# Patient Record
Sex: Male | Born: 1995
Health system: Southern US, Community
[De-identification: ages and names within clinical notes are randomized; demographics above are authoritative.]

## PROBLEM LIST (undated history)

## (undated) DIAGNOSIS — G709 Myoneural disorder, unspecified: Secondary | ICD-10-CM

## (undated) DIAGNOSIS — G809 Cerebral palsy, unspecified: Secondary | ICD-10-CM

## (undated) DIAGNOSIS — F419 Anxiety disorder, unspecified: Secondary | ICD-10-CM

## (undated) DIAGNOSIS — R569 Unspecified convulsions: Secondary | ICD-10-CM

## (undated) DIAGNOSIS — G808 Other cerebral palsy: Secondary | ICD-10-CM

## (undated) HISTORY — PX: BRAIN SURGERY: SHX531

## (undated) HISTORY — DX: Unspecified convulsions: R56.9

## (undated) HISTORY — PX: LEG TENDON SURGERY: SHX1004

---
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
## 2015-03-22 ENCOUNTER — Other Ambulatory Visit: Payer: Self-pay | Admitting: Family

## 2015-04-15 ENCOUNTER — Other Ambulatory Visit: Payer: Self-pay | Admitting: Family

## 2015-04-17 ENCOUNTER — Other Ambulatory Visit: Payer: Self-pay

## 2015-04-17 MED ORDER — DEPAKOTE SPRINKLES 125 MG PO CSDR: DELAYED_RELEASE_CAPSULE | ORAL | Status: DC

## 2015-04-19 ENCOUNTER — Other Ambulatory Visit: Payer: Self-pay | Admitting: Family

## 2015-04-20 ENCOUNTER — Ambulatory Visit (INDEPENDENT_AMBULATORY_CARE_PROVIDER_SITE_OTHER): Payer: Medicaid Other | Admitting: Family

## 2015-04-20 ENCOUNTER — Encounter: Payer: Self-pay | Admitting: Family

## 2015-04-20 VITALS — BP 100/66 | HR 80 | Wt 75.0 lb

## 2015-04-20 DIAGNOSIS — F71 Moderate intellectual disabilities: Secondary | ICD-10-CM

## 2015-04-20 DIAGNOSIS — G911 Obstructive hydrocephalus: Secondary | ICD-10-CM | POA: Diagnosis not present

## 2015-04-20 DIAGNOSIS — R451 Restlessness and agitation: Secondary | ICD-10-CM

## 2015-04-20 DIAGNOSIS — G40309 Generalized idiopathic epilepsy and epileptic syndromes, not intractable, without status epilepticus: Secondary | ICD-10-CM

## 2015-04-20 DIAGNOSIS — Z87728 Personal history of other specified (corrected) congenital malformations of nervous system and sense organs: Secondary | ICD-10-CM | POA: Diagnosis not present

## 2015-04-20 DIAGNOSIS — G808 Other cerebral palsy: Secondary | ICD-10-CM

## 2015-04-20 DIAGNOSIS — G40209 Localization-related (focal) (partial) symptomatic epilepsy and epileptic syndromes with complex partial seizures, not intractable, without status epilepticus: Secondary | ICD-10-CM | POA: Diagnosis not present

## 2015-04-20 MED ORDER — DIAZEPAM 1 MG/ML PO SOLN: ORAL | Status: DC

## 2015-04-20 NOTE — Patient Instructions (Signed)
For Theodore Lewis's restless behavior, I sent a prescription to the pharmacy for Diazepam liquid. Give him 12ml up to twice per day as needed for restless behavior.  Call and let me know how this works. I have started him at the lowest dose - we may need to increase the dose if this is too low.   Continue his other medications without change for now. Let me know if his seizure frequency increases.   Please plan to return for follow up in 1 year or sooner if needed.

## 2015-04-20 NOTE — Progress Notes (Signed)
Patient: Theodore Lewis MRN: [id number] Sex: male DOB: 1996-09-11  Provider: Elveria Rising, NP Location of Care: [facility] Child Neurology  Note type: Routine return visit  History of Present Illness: Referral Source: Dr. Bernadette Hoit History from: both parents Chief Complaint: Seizures/ Congenital Quadriparesis  Theodore Lewis is a 19 y.o. with history of seizures, quadriparesis, and moderate cognitive disability. He was last seen Mar 14, 2014. Theodore Lewis had post hemorrhagic hydrocephalus with delayed appearance until he was a couple of months out of the nursery. This required a ventriculoperitoneal shunt. He has spastic quadriparesis and contractures, poor vision, evidence of an axial collection of mixed-type in the right frontal temporal parietal region and possible infarction of the right brain with encephalomalacia of the left brain. The shunt has been functioning well.  Theodore Lewis's parents report today that he has intermittent seizures, approximately once or twice per month. Most do not require Diastat but occasionally they give it when the seizure is not brief. He has had high therapeutic levels of both his antiepileptic medicines in the past. Theodore Lewis takes and tolerates Carbatrol and Depakote Sprinkles but his parents say that he strongly resists taking the medications when administered.  Theodore Lewis attends [employer] in [place]. He will attend there until he is 22. He has a CAPS worker 36 to 40 hours per week. His parents also have respite care, but they never use it.  Today his parents ask about giving him a medication to help him to be calmer when they are out in public, or have visitors in their home. They say that in public places, such as restaurants or when they have visitors in their home, Theodore Lewis becomes increasingly agitated, making vocalizations, moving in his chair, pulling at everything he can reach. They have tried behavioral interventions to no avail.  Parents said that Dr Sharene Skeans has suggested giving him something for anxiety at these times.    Theodore Lewis has been otherwise healthy since last seen. His parents have no other health concerns for him today.  Review of Systems: Please see the HPI for neurologic and other pertinent review of systems. Otherwise, the following systems are noncontributory including constitutional, eyes, ears, nose and throat, cardiovascular, respiratory, gastrointestinal, genitourinary, musculoskeletal, skin, endocrine, hematologic/lymph, allergic/immunologic and psychiatric.   Past Medical History  Diagnosis Date  . Cerebral palsy   . Congenital quadriparesis   . Seizures     last seizure 3 yrs ago  . Seizure   . Anxiety   . Neuromuscular disorder    Hospitalizations: No., Head Injury: No., Nervous System Infections: No., Immunizations up to date: Yes.   Past Medical History Comments: He presented at few months of age with massive hydrocephalus. He required urgent placement of a ventriculoperitoneal shunt. He has experienced revisions in the past. Brain images in 2007 showed an extra-axial collection of mixed type in the right frontotemporal parietal region with possible infarction in the right brain and significant encephalomalacia of the left brain. Ventricles were well decompressed.  A CT scan of the brain and shunt series Mar 04, 2013 showed microcephaly with marked diffuse thickening of the calvarium and marked enlargement of the mastoid sinus bilaterally. Left frontal shunt catheter extends to the region of the third ventricle and is unchanged. No hydrocephalus. There is cerebellar atrophy bilaterally. 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 Brainstem is small. There is atrophy of the left occipital lobe. Agenesis of the corpus callosum.  Large extra-axial fluid collection on the right  measures 4.6 x 11.1 cm. This has low density centrally and the wall has calcified since  the prior study. The fluid collection has matured and become better defined since the prior study. There is some mass effect on the right cerebral hemisphere. No midline shift to the left. No acute hemorrhage.  Shunt tubing was intact from the ventricles to the abdomen.   Surgical History Past Surgical History  Procedure Laterality Date  . Brain surgery      Shunt placed when he was a yr. old  . Leg tendon surgery Bilateral approx 6 yrs ago  . I&d extremity Bilateral 04/29/2014    Procedure: IRRIGATION AND DEBRIDEMENT EXTREMITY;  Surgeon: Eldred Manges, MD;  Location: [facility] OR;  Service: Orthopedics;  Laterality: Bilateral;  Right and Left Hamstring Lenghening and Fiberglass Casting    Family History family history includes Stomach cancer in his paternal grandfather and paternal grandmother; Throat cancer in his paternal grandfather. Family History is otherwise negative for migraines, seizures, cognitive impairment, blindness, deafness, birth defects, chromosomal disorder, autism.  Social History History   Social History  . Marital Status: Single    Spouse Name: N/A  . Number of Children: N/A  . Years of Education: N/A   Social History Main Topics  . Smoking status: Never Smoker   . Smokeless tobacco: Never Used  . Alcohol Use: No  . Drug Use: No  . Sexual Activity: No   Other Topics Concern  . None   Social History Narrative   Educational level: special education School Attending: [employer] Living with:  both parents  Hobbies/Interest: Theodore Lewis enjoys walking, swimming, watching TV, drinking and eating.  School comments:  Theodore Lewis is doing well in school.     Allergies No Known Allergies  Physical Exam BP 100/66 mmHg  Pulse 80  Wt 75 lb (34.02 kg) General: thin young man seated in wheelchair, in no evident distress  Head: he has a ventriculoperitoneal shunt with palpable tubing on his head on the left  Ears, Nose and Throat: unable to fully assess  as he is unable to follow directions  Neck: supple with no carotid or supraclavicular bruits.  Respiratory: lungs are clear to auscultation  Cardiovascular: regular rate and rhythm, no murmurs.  Musculoskeletal: thin extremities with significant muscle wasting; Significant contractions and the hips, knees, ankles, elbows, wrists, and shoulders  Skin: no rashes or lesions  Neurologic Exam  Mental Status: Awake and fully alert. He is in near constant movement, pulling at the examiner and equipment. He does not follow instructions and is not cooperative. He has no language. Cranial Nerves: Fundoscopic exam - red reflex present. Unable to visualize fundus. Pupils equal, briskly reactive to light. No nystagmus. He would briefly fix and follow on visual stimuli. He turns to localize sounds. His face appears to move normally and symmetrically. Neck flexion and extension normal.  Motor: Increased tone in extremities. He has tight heel cords and appears to have fixed contractures at the knees, left more than right. He was difficult to assess due to his lack of cooperation. He had a strong grip bilaterally, but clumsy and dystonic posturing of the left hand in comparison to the right when trying to reach for objects.  Sensory: Withdrawal x4  Coordination: Poor coordination with reaching for objects.  Gait and Station: Wheelchair bound  Reflexes: 1+ and symmetric. Toes appear to be downgoing but again was difficult to assess due to lack of cooperation. No clonus.  Impression 1. Localization-related  epilepsy with complex partial seizures 2. Generalized convulsive epilepsy 3. Acquired obstructive hydrocephalus with functioning VP shunt 4. Congenital quadriplegia with sparing of the right arm 5. Moderate intellectual disabilities 6. Episodic agitated behavior.  Recommendations for plan of care The patient's previous [facility] records were reviewed. Morrell has neither had nor required imaging  or lab studies since the last visit. He is a 19 year old boy with history of seizures, quadriparesis, and moderate cognitive disability. He has ongoing intermittent seizures that have not increased in frequency or severity. He will continue his antiepileptic medications without change for now. I talked with his parents about the agitation that Costa exhibits in public or with visitors and recommended a trial of Diazepam prior to taking him out or when they anticipate visitors in their home. I asked them to let me know how this works. I completed a form for [facility] for him to receive Diastat for seizures. I will see Aloysious back in follow up in 6 months or sooner if needed.   The medication list was reviewed and reconciled.  I reviewed changes were made in the prescribed medications today.  A complete medication list was provided to the patient's parents.  Dr. Sharene Skeans was consulted regarding the patient.   Total time spent with the patient was 30 minutes, of which 50% or more was spent in counseling and coordination of care.

## 2015-04-25 ENCOUNTER — Other Ambulatory Visit: Payer: Self-pay | Admitting: Family

## 2015-05-27 ENCOUNTER — Other Ambulatory Visit: Payer: Self-pay | Admitting: Family

## 2015-06-18 ENCOUNTER — Other Ambulatory Visit: Payer: Self-pay | Admitting: Pediatrics

## 2015-08-30 DIAGNOSIS — Z982 Presence of cerebrospinal fluid drainage device: Secondary | ICD-10-CM | POA: Insufficient documentation

## 2015-10-15 ENCOUNTER — Other Ambulatory Visit: Payer: Self-pay | Admitting: Family

## 2015-11-24 ENCOUNTER — Telehealth: Payer: Self-pay | Admitting: Family

## 2015-11-24 NOTE — Telephone Encounter (Signed)
Mom oan Aten left message about Theodore Lewis. She said that [facility] needs a letter regarding his condition so that he can continue his support plan. She said that he needs it by Jan 26 and asked for it to be faxed to [facility] fax # [phone]. Mom can be reached at ph [phone]. I called and spoke with Dad, letting him know that we will fax the letter as requested. TG

## 2015-11-29 NOTE — Telephone Encounter (Signed)
I faxed the letter to [facility] at [facility] as requested. TG

## 2015-12-28 ENCOUNTER — Other Ambulatory Visit: Payer: Self-pay | Admitting: Family

## 2016-01-21 ENCOUNTER — Other Ambulatory Visit: Payer: Self-pay | Admitting: Pediatrics

## 2016-04-22 ENCOUNTER — Other Ambulatory Visit: Payer: Self-pay | Admitting: Family

## 2016-04-30 ENCOUNTER — Ambulatory Visit (INDEPENDENT_AMBULATORY_CARE_PROVIDER_SITE_OTHER): Payer: Medicare Other | Admitting: Family

## 2016-04-30 ENCOUNTER — Encounter: Payer: Self-pay | Admitting: Family

## 2016-04-30 VITALS — BP 104/70 | HR 88 | Wt 75.0 lb

## 2016-04-30 DIAGNOSIS — Z79899 Other long term (current) drug therapy: Secondary | ICD-10-CM

## 2016-04-30 DIAGNOSIS — M629 Disorder of muscle, unspecified: Secondary | ICD-10-CM | POA: Diagnosis not present

## 2016-04-30 DIAGNOSIS — R451 Restlessness and agitation: Secondary | ICD-10-CM | POA: Diagnosis not present

## 2016-04-30 DIAGNOSIS — Z87728 Personal history of other specified (corrected) congenital malformations of nervous system and sense organs: Secondary | ICD-10-CM | POA: Diagnosis not present

## 2016-04-30 DIAGNOSIS — G40209 Localization-related (focal) (partial) symptomatic epilepsy and epileptic syndromes with complex partial seizures, not intractable, without status epilepticus: Secondary | ICD-10-CM

## 2016-04-30 DIAGNOSIS — F71 Moderate intellectual disabilities: Secondary | ICD-10-CM

## 2016-04-30 DIAGNOSIS — G911 Obstructive hydrocephalus: Secondary | ICD-10-CM | POA: Diagnosis not present

## 2016-04-30 DIAGNOSIS — G808 Other cerebral palsy: Secondary | ICD-10-CM

## 2016-04-30 DIAGNOSIS — G40309 Generalized idiopathic epilepsy and epileptic syndromes, not intractable, without status epilepticus: Secondary | ICD-10-CM

## 2016-04-30 MED ORDER — CARBATROL 300 MG PO CP12: 300.0000 mg | ORAL_CAPSULE | Freq: Two times a day (BID) | ORAL | Status: DC

## 2016-04-30 MED ORDER — DIASTAT ACUDIAL 10 MG RE GEL: RECTAL | Status: DC

## 2016-04-30 MED ORDER — DEPAKOTE SPRINKLES 125 MG PO CSDR: DELAYED_RELEASE_CAPSULE | ORAL | Status: DC

## 2016-04-30 NOTE — Patient Instructions (Signed)
Because Theodore Lewis has been experiencing more seizures than usual, I have ordered lab studies to check his Carbatrol and Depakote levels, as well as check his blood counts and liver. The blood needs to be drawn in the morning, before his first dose of medication of the day. He can have breakfast and drink liquids that morning. I will call you when the lab results are available.   Theodore Lewis may need to have an EEG if his lab studies are normal but seizures continue.   Try to work on helping him to get more sleep.   Call me if you have questions or concerns.   I will see Theodore Lewis again in 1 year or sooner if needed.

## 2016-04-30 NOTE — Progress Notes (Signed)
Patient: Theodore Lewis MRN: [id number] Sex: male DOB: 04-02-1996  Provider: Elveria Rising, NP Location of Care: [facility] Child Neurology  Note type: Routine return visit  History of Present Illness: Referral Source: Dr. Bernadette Hoit History from: both parents, patient and [facility] chart Chief Complaint: Seizures/Congenital Quadriparesis  Theodore Lewis is a 20 y.o. young man with history of seizures, quadriparesis, and moderate cognitive disability. He was last seen April 20, 2015. Theodore Lewis had post hemorrhagic hydrocephalus with delayed appearance until he was a couple of months out of the nursery. This required a ventriculoperitoneal shunt. He has spastic quadriparesis and contractures, poor vision, evidence of an axial collection of mixed-type in the right frontal temporal parietal region and possible infarction of the right brain with encephalomalacia of the left brain. The shunt has been functioning well.  Theodore Lewis's parents report today that he has had some increase in seizures, usually late in the afternoon or evening. Most do not require Diastat but occasionally they give it when the seizure is not brief. Theodore Lewis takes and tolerates Carbatrol and Depakote Sprinkles but his parents say that he strongly resists taking the medications when administered. His parents also note that he sleeps poorly, sometimes remaining awake until early morning, and then napping a short time before getting up for the day. They feel that he sleeps better when he has had a busy, active day.   Theodore Lewis attends [employer] in [place]. He will attend there until he is 22. He has a CAPS worker 36 to 40 hours per week. His parents also have respite care, but they never use it. His parents are looking in to options for him long term, as Dad reportedly has a muscle wasting disease. Mom does not feel that she can adequately care for both Dad and Theodore Lewis as time goes on. They initially plan for him to  attend a day program after he leaves [facility] but are also considering placement in a group home or family care arrangement.   When Theodore Lewis was last seen, his parents asked for a medication to help him to be calmer when they are out in public or have visitors in their home. Diazepam was prescribed and his mother said that she gave it once and it did not help.   His parents were concerned today because they feel that Theodore Lewis is using his left arm and hand less than the right. They worry that it is weaker, but also note that he tends to use the right arm and hand more because it is more functional to him. Theodore Lewis receives physical therapy during the school year.   Theodore Lewis has been otherwise healthy since last seen. His parents have no other health concerns for him today.  Review of Systems: Please see the HPI for neurologic and other pertinent review of systems. Otherwise, the following systems are noncontributory including constitutional, eyes, ears, nose and throat, cardiovascular, respiratory, gastrointestinal, genitourinary, musculoskeletal, skin, endocrine, hematologic/lymph, allergic/immunologic and psychiatric.   Past Medical History  Diagnosis Date  . Cerebral palsy (HCC)   . Congenital quadriparesis (HCC)   . Seizures (HCC)     last seizure 3 yrs ago  . Seizure (HCC)   . Anxiety   . Neuromuscular disorder (HCC)    Hospitalizations: No., Head Injury: No., Nervous System Infections: No., Immunizations up to date: Yes.   Past Medical History Comments: He presented at few months of age with massive hydrocephalus. He required urgent placement of a ventriculoperitoneal shunt. He has  experienced revisions in the past. Brain images in 2007 showed an extra-axial collection of mixed type in the right frontotemporal parietal region with possible infarction in the right brain and significant encephalomalacia of the left brain. Ventricles were well decompressed.  A CT scan of the brain  and shunt series Mar 04, 2013 showed microcephaly with marked diffuse thickening of the calvarium and marked enlargement of the mastoid sinus bilaterally. Left frontal shunt catheter extends to the region of the third ventricle and is unchanged. No hydrocephalus. There is cerebellar atrophy bilaterally. 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 Brainstem is small. There is atrophy of the left occipital lobe. Agenesis of the corpus callosum.  Large extra-axial fluid collection on the right measures 4.6 x 11.1 cm. This has low density centrally and the wall has calcified since the prior study. The fluid collection has matured and become better defined since the prior study. There is some mass effect on the right cerebral hemisphere. No midline shift to the left. No acute hemorrhage.  Shunt tubing was intact from the ventricles to the abdomen.  Surgical History Past Surgical History  Procedure Laterality Date  . Brain surgery      Shunt placed when he was a yr. old  . Leg tendon surgery Bilateral approx 6 yrs ago  . I&d extremity Bilateral 04/29/2014    Procedure: IRRIGATION AND DEBRIDEMENT EXTREMITY;  Surgeon: Eldred Manges, MD;  [facility] OR;  Service: Orthopedics;  Laterality: Bilateral;  Right and Left Hamstring Lenghening and Fiberglass Casting    Family History family history includes Stomach cancer in his paternal grandfather and paternal grandmother; Throat cancer in his paternal grandfather. Family History is otherwise negative for migraines, seizures, cognitive impairment, blindness, deafness, birth defects, chromosomal disorder, autism.  Social History Social History   Social History  . Marital Status: Single    Spouse Name: N/A  . Number of Children: N/A  . Years of Education: N/A   Social History Main Topics  . Smoking status: Never Smoker   . Smokeless tobacco: Never Used  . Alcohol Use: No  . Drug Use: No  . Sexual Activity: No   Other  Topics Concern  . None   Social History Narrative    Allergies No Known Allergies  Physical Exam Wt 75 lb (34.02 kg) General: thin young man seated in wheelchair, in no evident distress  Head: he has a ventriculoperitoneal shunt with palpable tubing on his head on the left  Ears, Nose and Throat: unable to fully assess as he is unable to follow directions  Neck: supple with no carotid or supraclavicular bruits.  Respiratory: lungs are clear to auscultation  Cardiovascular: regular rate and rhythm, no murmurs.  Musculoskeletal: thin extremities with significant muscle wasting; Significant contractions and the hips, knees, ankles, elbows, wrists, and shoulders  Skin: no rashes or lesions  Neurologic Exam  Mental Status: Awake and fully alert. He is in near constant movement, pulling at the examiner and equipment. He does not follow instructions and is not cooperative. He has no language. Cranial Nerves: Fundoscopic exam - red reflex present. Unable to visualize fundus. Pupils equal, briskly reactive to light. No nystagmus. He would briefly fix and follow on visual stimuli. He turns to localize sounds. His face appears to move normally and symmetrically. Neck flexion and extension normal.  Motor: Increased tone in extremities. He has tight heel cords and appears to have fixed contractures at the knees, left more than right. He  was difficult to assess due to his lack of cooperation. He had a strong grip bilaterally, but clumsy and dystonic posturing of the left hand in comparison to the right when trying to reach for objects. When he left the exam room, he used both hands to propel the wheelchair.  Sensory: Withdrawal x4  Coordination: Poor coordination with reaching for objects.  Gait and Station: Wheelchair bound  Reflexes: 1+ and symmetric. Toes appear to be downgoing but again was difficult to assess due to lack of cooperation. No clonus.  Impression 1. Localization-related  epilepsy with complex partial seizures 2. Generalized convulsive epilepsy 3. Acquired obstructive hydrocephalus with functioning VP shunt 4. Congenital quadriplegia with sparing of the right arm 5. Moderate intellectual disabilities 6. Episodic agitated behavior.   Recommendations for plan of care [facility]. Ivin BootyJoshua has neither had nor required imaging or lab studies since the last visit. He is a 20 year old young man with history of seizures, quadriparesis, and moderate cognitive disability. He has ongoing seizures that have increased in frequency in the late afternoon and evening. He is not sleeping as well since he is out of school. I talked with his parents and explained that lack of sleep can trigger seizures, and recommended that they try to keep him on a sleep schedule. I also recommended checking blood levels as that has not been done in some time. I will call his parents with the results when available. If the seizures continue despite therapeutic blood levels and improvement in his sleep, Ivin BootyJoshua may need to have an EEG performed to evaluate the seizures. He will continue his antiepileptic medications without change for now. I talked with his parents about the agitation that Ivin BootyJoshua exhibits in public or with visitors and recommended that they increase the Diazepam dose to 2ml and give it prior to taking him out or when they anticipate visitors in their home. I asked them to let me know how this works. We talked about his preference for using his right hand over the left. It is unlikely that physical or occupational therapy would be beneficial for this as it is a deliberate behavior on Taeshawn's part. I encouraged his parents to continue to remind him to use the left hand. I completed a form for [facility] for him to receive Diastat for seizures. I will see Ivin BootyJoshua back in follow up in 1 year or sooner if needed.   The medication list was reviewed and  reconciled.  No changes were made in the prescribed medications today.  A complete medication list was provided to the patient/caregiver.    Medication List       This list is accurate as of: 04/30/16 10:41 AM.  Always use your most recent med list.               CARBATROL 300 MG 12 hr capsule  Generic drug:  carbamazepine  TAKE ONE CAPSULE BY MOUTH TWICE DAILY     cetirizine 10 MG tablet  Commonly known as:  ZYRTEC  Take 10 mg by mouth daily.     DEPAKOTE SPRINKLES 125 MG capsule  Generic drug:  divalproex  TAKE 5 CAPSULES BY MOUTH TWICE DAILY     DIASTAT ACUDIAL 10 MG Gel  Generic drug:  diazepam  INSERT 10MG  RECTALLY AT ONSET OF SEIZURE     diazepam 1 MG/ML solution  Commonly known as:  VALIUM  Give 1 ml by mouth up to twice per day as needed  for restlessness        Dr. Sharene SkeansHickling was consulted regarding the patient.   Total time spent with the patient was 30 minutes, of which 50% or more was spent in counseling and coordination of care.   Elveria Risingina Andreia Gandolfi

## 2016-05-06 DIAGNOSIS — Z79899 Other long term (current) drug therapy: Secondary | ICD-10-CM | POA: Diagnosis not present

## 2016-05-06 DIAGNOSIS — G40209 Localization-related (focal) (partial) symptomatic epilepsy and epileptic syndromes with complex partial seizures, not intractable, without status epilepticus: Secondary | ICD-10-CM | POA: Diagnosis not present

## 2016-05-06 DIAGNOSIS — G40309 Generalized idiopathic epilepsy and epileptic syndromes, not intractable, without status epilepticus: Secondary | ICD-10-CM | POA: Diagnosis not present

## 2016-05-06 LAB — CBC WITH DIFFERENTIAL/PLATELET
BASOS ABS: 0 {cells}/uL (ref 0–200)
Basophils Relative: 0 %
EOS PCT: 1 %
Eosinophils Absolute: 41 cells/uL (ref 15–500)
HEMATOCRIT: 46.2 % (ref 38.5–50.0)
Hemoglobin: 15.4 g/dL (ref 13.2–17.1)
LYMPHS PCT: 22 %
Lymphs Abs: 902 cells/uL (ref 850–3900)
MCH: 31.6 pg (ref 27.0–33.0)
MCHC: 33.3 g/dL (ref 32.0–36.0)
MCV: 94.9 fL (ref 80.0–100.0)
MONOS PCT: 10 %
MPV: 10.6 fL (ref 7.5–12.5)
Monocytes Absolute: 410 cells/uL (ref 200–950)
Neutro Abs: 2747 cells/uL (ref 1500–7800)
Neutrophils Relative %: 67 %
Platelets: 171 10*3/uL (ref 140–400)
RBC: 4.87 MIL/uL (ref 4.20–5.80)
RDW: 14.6 % (ref 11.0–15.0)
WBC: 4.1 10*3/uL (ref 3.8–10.8)

## 2016-05-07 LAB — ALT: ALT: 19 U/L (ref 9–46)

## 2016-05-07 LAB — CARBAMAZEPINE LEVEL, TOTAL: CARBAMAZEPINE LVL: 7.9 mg/L (ref 4.0–12.0)

## 2016-05-07 LAB — VALPROIC ACID LEVEL: VALPROIC ACID LVL: 80.7 ug/mL (ref 50.0–100.0)

## 2016-05-20 ENCOUNTER — Other Ambulatory Visit: Payer: Self-pay | Admitting: Family

## 2016-05-29 DIAGNOSIS — R630 Anorexia: Secondary | ICD-10-CM | POA: Diagnosis not present

## 2016-05-29 DIAGNOSIS — R5383 Other fatigue: Secondary | ICD-10-CM | POA: Diagnosis not present

## 2016-05-29 DIAGNOSIS — R0989 Other specified symptoms and signs involving the circulatory and respiratory systems: Secondary | ICD-10-CM | POA: Diagnosis not present

## 2016-07-02 DIAGNOSIS — Z23 Encounter for immunization: Secondary | ICD-10-CM | POA: Diagnosis not present

## 2017-01-14 ENCOUNTER — Telehealth (INDEPENDENT_AMBULATORY_CARE_PROVIDER_SITE_OTHER): Payer: Self-pay | Admitting: *Deleted

## 2017-01-14 NOTE — Telephone Encounter (Signed)
Patient's mother called in this morning in regards to needing a new shoe for her son? He normally gets new shoes and socks? He did not receive anything to go with his OFO's? Her CB # [phone]. Thank you

## 2017-01-14 NOTE — Telephone Encounter (Signed)
Please advise 

## 2017-01-15 NOTE — Telephone Encounter (Signed)
I called discussed. Not sure if I ordered shoes and sox previously. [facility] cancelled due to snow yesterday. She will talk with [facility] and Claris CheMargaret can call me. If I ordered previously will continue to order. I discussed with her Medicaid change and that in the past they did not cover shoes etc.

## 2017-03-01 ENCOUNTER — Other Ambulatory Visit: Payer: Self-pay | Admitting: Family

## 2017-03-10 ENCOUNTER — Telehealth (INDEPENDENT_AMBULATORY_CARE_PROVIDER_SITE_OTHER): Payer: Self-pay | Admitting: *Deleted

## 2017-03-10 ENCOUNTER — Telehealth (INDEPENDENT_AMBULATORY_CARE_PROVIDER_SITE_OTHER): Payer: Self-pay | Admitting: Family

## 2017-03-10 DIAGNOSIS — G40309 Generalized idiopathic epilepsy and epileptic syndromes, not intractable, without status epilepticus: Secondary | ICD-10-CM

## 2017-03-10 DIAGNOSIS — G40209 Localization-related (focal) (partial) symptomatic epilepsy and epileptic syndromes with complex partial seizures, not intractable, without status epilepticus: Secondary | ICD-10-CM

## 2017-03-10 MED ORDER — CARBATROL 300 MG PO CP12: 300.0000 mg | ORAL_CAPSULE | Freq: Two times a day (BID) | ORAL | 5 refills | Status: DC

## 2017-03-10 MED ORDER — DEPAKOTE SPRINKLES 125 MG PO CSDR: DELAYED_RELEASE_CAPSULE | ORAL | 5 refills | Status: DC

## 2017-03-10 NOTE — Telephone Encounter (Signed)
I faxed the Rx's to [facility] and called Mom and left a message to let her know. TG

## 2017-03-10 NOTE — Telephone Encounter (Signed)
°  Who's calling (name and relationship to patient) : Bauman,Joan (mother) Best contact number: [phone] Provider they see: Elveria Risingina Goodpasture Reason for call: Mother left a VM stating she's needing pre auth. For prescription refill through her insurance. Pt is low on medication mother states. Asked for [name] to give her a call.    PRESCRIPTION REFILL ONLY  Name of prescription:  Pharmacy:

## 2017-03-10 NOTE — Telephone Encounter (Signed)
  Who's calling (name and relationship to patient) : Aurea GraffJoan, mother  Best contact number: [phone]  Provider they see: Hickling/[facility]  Reason for call: Mother called in stating she has been trying to get her son's refills on Carbatrol & Depakote.  The Pharmacy is telling her they have not received any refills and that they have been trying to contact us for a week.  I seen that the refills were electronically sent on 4.30.2018 at 07:37 with confirmation.  I also verified with mother that the pharmacy that the prescriptions were sent was the correct one and she stated yes.  Please contact the pharmacy and resend prescriptions due to patient is almost out of meds.  Please call mother and let her know when the prescriptions have been sent so she can pick up.  She can be reached at [phone].     PRESCRIPTION REFILL ONLY  Name of prescription: Carbatrol & Depakote  Pharmacy: [facility]

## 2017-03-10 NOTE — Telephone Encounter (Signed)
I called the pharmacy and obtained the PA information, and contacted [facility]. The PA is pending at this time. TG

## 2017-03-10 NOTE — Telephone Encounter (Signed)
I left a message for Mom letting her know that the PA is pending with the insurance company. TG

## 2017-03-10 NOTE — Telephone Encounter (Signed)
Mother, Theodore Lewis, called back stating [facility] will not fill the prescription's because they need a prior authorization.  Mother stated Theodore Lewis has enough medication for tonight and that's it.  Please call mother back at [phone] to let her know when she can pick up meds.  Thanks.

## 2017-03-11 NOTE — Telephone Encounter (Signed)
I received a fax that [facility] approved the Carbatrol and the Depakote Sprinkles. I called the pharmacy and left a message to let them know. TG

## 2017-03-11 NOTE — Telephone Encounter (Signed)
I called Mom and left a message that insurance approved the medication. TG

## 2017-05-13 ENCOUNTER — Telehealth (INDEPENDENT_AMBULATORY_CARE_PROVIDER_SITE_OTHER): Payer: Self-pay | Admitting: Family

## 2017-05-13 NOTE — Telephone Encounter (Signed)
  Who's calling (name and relationship to patient) : Aurea GraffJoan, mother  Best contact number: [phone]  Provider they see: Elveria Risingina Goodpasture, NP  Reason for call: Mother brought in a (1) Medication Authorization Form for Diastat to be completed for Theodore Lewis for school.  Mother has requested to be called at [phone] when form is ready for pick up.  Form has been labeled and placed in [name]'s tray in her office.     PRESCRIPTION REFILL ONLY  Name of prescription:  Pharmacy:

## 2017-05-23 ENCOUNTER — Emergency Department (HOSPITAL_COMMUNITY): Payer: Medicare Other

## 2017-05-23 ENCOUNTER — Encounter (HOSPITAL_COMMUNITY): Payer: Self-pay | Admitting: Emergency Medicine

## 2017-05-23 ENCOUNTER — Emergency Department (HOSPITAL_COMMUNITY)
Admission: EM | Admit: 2017-05-23 | Discharge: 2017-05-23 | Disposition: A | Discharge status: Home / Self Care | Payer: Medicare Other | Attending: Emergency Medicine | Admitting: Emergency Medicine

## 2017-05-23 DIAGNOSIS — G809 Cerebral palsy, unspecified: Secondary | ICD-10-CM | POA: Insufficient documentation

## 2017-05-23 DIAGNOSIS — R451 Restlessness and agitation: Secondary | ICD-10-CM | POA: Diagnosis not present

## 2017-05-23 DIAGNOSIS — Z79899 Other long term (current) drug therapy: Secondary | ICD-10-CM | POA: Diagnosis not present

## 2017-05-23 DIAGNOSIS — D72829 Elevated white blood cell count, unspecified: Secondary | ICD-10-CM | POA: Diagnosis not present

## 2017-05-23 DIAGNOSIS — K8020 Calculus of gallbladder without cholecystitis without obstruction: Secondary | ICD-10-CM | POA: Diagnosis not present

## 2017-05-23 DIAGNOSIS — R4182 Altered mental status, unspecified: Secondary | ICD-10-CM | POA: Diagnosis not present

## 2017-05-23 DIAGNOSIS — R159 Full incontinence of feces: Secondary | ICD-10-CM | POA: Diagnosis not present

## 2017-05-23 DIAGNOSIS — R197 Diarrhea, unspecified: Secondary | ICD-10-CM | POA: Insufficient documentation

## 2017-05-23 DIAGNOSIS — A419 Sepsis, unspecified organism: Secondary | ICD-10-CM

## 2017-05-23 LAB — COMPREHENSIVE METABOLIC PANEL
ALT: 23 U/L (ref 17–63)
AST: 36 U/L (ref 15–41)
Albumin: 4.1 g/dL (ref 3.5–5.0)
Alkaline Phosphatase: 105 U/L (ref 38–126)
Anion gap: 15 (ref 5–15)
BUN: 14 mg/dL (ref 6–20)
CHLORIDE: 98 mmol/L — AB (ref 101–111)
CO2: 27 mmol/L (ref 22–32)
CREATININE: 0.72 mg/dL (ref 0.61–1.24)
Calcium: 9.5 mg/dL (ref 8.9–10.3)
Glucose, Bld: 122 mg/dL — ABNORMAL HIGH (ref 65–99)
Potassium: 3.5 mmol/L (ref 3.5–5.1)
Sodium: 140 mmol/L (ref 135–145)
TOTAL PROTEIN: 7.9 g/dL (ref 6.5–8.1)
Total Bilirubin: 0.8 mg/dL (ref 0.3–1.2)

## 2017-05-23 LAB — CBC WITH DIFFERENTIAL/PLATELET
BASOS ABS: 0 10*3/uL (ref 0.0–0.1)
Basophils Relative: 0 %
EOS ABS: 0 10*3/uL (ref 0.0–0.7)
EOS PCT: 0 %
HCT: 42.2 % (ref 39.0–52.0)
Hemoglobin: 14.6 g/dL (ref 13.0–17.0)
Lymphocytes Relative: 15 %
Lymphs Abs: 1.2 10*3/uL (ref 0.7–4.0)
MCH: 33.5 pg (ref 26.0–34.0)
MCHC: 34.6 g/dL (ref 30.0–36.0)
MCV: 96.8 fL (ref 78.0–100.0)
MONO ABS: 0.6 10*3/uL (ref 0.1–1.0)
Monocytes Relative: 8 %
NEUTROS PCT: 77 %
Neutro Abs: 6 10*3/uL (ref 1.7–7.7)
PLATELETS: 118 10*3/uL — AB (ref 150–400)
RBC: 4.36 MIL/uL (ref 4.22–5.81)
RDW: 14.2 % (ref 11.5–15.5)
WBC: 7.8 10*3/uL (ref 4.0–10.5)

## 2017-05-23 LAB — I-STAT CG4 LACTIC ACID, ED
Lactic Acid, Venous: 4.09 mmol/L (ref 0.5–1.9)
Lactic Acid, Venous: 2.2 mmol/L (ref 0.5–1.9)

## 2017-05-23 LAB — URINALYSIS, ROUTINE W REFLEX MICROSCOPIC
BILIRUBIN URINE: NEGATIVE
GLUCOSE, UA: NEGATIVE mg/dL
KETONES UR: NEGATIVE mg/dL
LEUKOCYTES UA: NEGATIVE
NITRITE: NEGATIVE
PROTEIN: NEGATIVE mg/dL
Specific Gravity, Urine: 1.005 (ref 1.005–1.030)
Squamous Epithelial / LPF: NONE SEEN
pH: 7 (ref 5.0–8.0)

## 2017-05-23 LAB — CK: Total CK: 109 U/L (ref 49–397)

## 2017-05-23 LAB — VALPROIC ACID LEVEL: Valproic Acid Lvl: 88 ug/mL (ref 50.0–100.0)

## 2017-05-23 MED ORDER — LORAZEPAM 2 MG/ML IJ SOLN
1.0000 mg | Freq: Once | INTRAMUSCULAR | Status: AC
  Administered 2017-05-23: 1 mg via INTRAVENOUS
  Filled 2017-05-23: qty 1

## 2017-05-23 MED ORDER — SODIUM CHLORIDE 0.9 % IV BOLUS (SEPSIS)
1000.0000 mL | Freq: Once | INTRAVENOUS | Status: AC
  Administered 2017-05-23: 1000 mL via INTRAVENOUS

## 2017-05-23 MED ORDER — IBUPROFEN 100 MG/5ML PO SUSP: 400.0000 mg | ORAL | 0 refills | Status: DC | PRN

## 2017-05-23 MED ORDER — MORPHINE SULFATE (PF) 2 MG/ML IV SOLN
2.0000 mg | INTRAVENOUS | Status: DC | PRN
  Administered 2017-05-23: 2 mg via INTRAVENOUS
  Filled 2017-05-23: qty 1

## 2017-05-23 MED ORDER — IOPAMIDOL (ISOVUE-300) INJECTION 61%
INTRAVENOUS | Status: AC
  Administered 2017-05-23: 30 mL via ORAL
  Filled 2017-05-23: qty 30

## 2017-05-23 MED ORDER — PIPERACILLIN-TAZOBACTAM 3.375 G IVPB 30 MIN
3.3750 g | Freq: Once | INTRAVENOUS | Status: AC
  Administered 2017-05-23: 3.375 g via INTRAVENOUS
  Filled 2017-05-23: qty 50

## 2017-05-23 MED ORDER — DIPHENHYDRAMINE HCL 50 MG/ML IJ SOLN
25.0000 mg | Freq: Once | INTRAMUSCULAR | Status: AC
  Administered 2017-05-23: 25 mg via INTRAVENOUS
  Filled 2017-05-23: qty 1

## 2017-05-23 MED ORDER — IOPAMIDOL (ISOVUE-300) INJECTION 61%
INTRAVENOUS | Status: AC
Start: 2017-05-23 — End: 2017-05-23
  Administered 2017-05-23: 75 mL via INTRAVENOUS
  Filled 2017-05-23: qty 75

## 2017-05-23 MED ORDER — KETAMINE HCL-SODIUM CHLORIDE 100-0.9 MG/10ML-% IV SOSY
1.5000 mg/kg | PREFILLED_SYRINGE | Freq: Once | INTRAVENOUS | Status: AC
  Administered 2017-05-23: 54 mg via INTRAVENOUS
  Filled 2017-05-23: qty 10

## 2017-05-23 MED ORDER — VANCOMYCIN HCL IN DEXTROSE 1-5 GM/200ML-% IV SOLN
1000.0000 mg | Freq: Once | INTRAVENOUS | Status: AC
  Administered 2017-05-23: 1000 mg via INTRAVENOUS
  Filled 2017-05-23: qty 200

## 2017-05-23 MED ORDER — MORPHINE SULFATE (PF) 4 MG/ML IV SOLN
4.0000 mg | Freq: Once | INTRAVENOUS | Status: AC
  Administered 2017-05-23: 4 mg via INTRAVENOUS
  Filled 2017-05-23: qty 1

## 2017-05-23 MED ORDER — IOPAMIDOL (ISOVUE-300) INJECTION 61%
30.0000 mL | Freq: Once | INTRAVENOUS | Status: AC | PRN
  Administered 2017-05-23: 30 mL via ORAL

## 2017-05-23 NOTE — ED Triage Notes (Signed)
Pt family sts that pt had loose stool this am. Around 10am pt started to yell and kept grabbing at his R leg. Pt only wants to lie on floor at this time. Accommodations were made with blankets and pillow to make pt comfortable. Pt continues to yell, shake and thrash around on the floor. Pt family unable to console. Pt mother sts that this is not normal behavior.

## 2017-05-23 NOTE — ED Notes (Signed)
Notified CT that pt will not be ready for CT until he is medicated. They will contact this RN for update

## 2017-05-23 NOTE — Discharge Instructions (Signed)
Please return for worsening symptoms °

## 2017-05-23 NOTE — ED Notes (Signed)
Bed: WA17 Expected date:  Expected time:  Means of arrival:  Comments: Triage 4 

## 2017-05-23 NOTE — ED Provider Notes (Signed)
3:21 PM Pt signed out to me by B Ardelle Parkran PA-C. He is a 21 year old male presents with AMS and agitation. Hx of cerebral palsy, seizures, non verbal at baseline. He was acting normally this morning. He had some diarrhea, decreased appetite, decreased activity. After eating he started moaning and seemed to be in pain He's had worsening agitation with episodes of rigors/shaking. Work up in the ED revealed no leukocytosis, normal CMP. CXR negative. Lactic acid is elevated. CT A&P pending. UA pending. CK and Depakote level pending.  After Ativan, CT tech reports pt still cannot be still. Discussed with family and Dr. Juleen ChinaKohut. Family is ok with sedation pt for scans. Will add Head CT to evaluate shunt   CT of head, abdomen and pelvis are unremarkable. Lactic acid is improving. CK is normal. UA overall unremarkable. Depakote is therapeutic. Discussed with family about obs admission vs taking him home. They are comfortable with taking him home and returning for worsening symptoms. Strict return precautions were given.      Bethel BornGekas, Chinmayi Rumer Marie, PA-C 05/23/17 2305    Raeford RazorKohut, Stephen, MD 05/31/17 [phone]

## 2017-05-23 NOTE — ED Notes (Signed)
Not able to take BP. Pt not able to sit still.

## 2017-05-23 NOTE — ED Notes (Signed)
Blood draw attempted x 2 without success  

## 2017-05-23 NOTE — ED Notes (Signed)
Could not get vitals signs at this time.  Patient is unable to stay still.

## 2017-05-23 NOTE — ED Notes (Addendum)
Failed attempt to collect  Urine with In & Out. No urine return.

## 2017-05-23 NOTE — Progress Notes (Signed)
RT present for duration of sedation procedure to enable CT scan.  Pt remained stable throughout procedure on EtCO2 monitoring nasal cannula.

## 2017-05-23 NOTE — ED Notes (Signed)
Pt sleeping quietly at this time. Pt VSS. Will continue to monitor

## 2017-05-23 NOTE — ED Provider Notes (Signed)
[facility]-EMERGENCY DEPT Provider Note   CSN: [id number] Arrival date & time: 05/23/17  1126     History   Chief Complaint Chief Complaint  Patient presents with  . Altered Mental Status    HPI Theodore Lewis is a 21 y.o. male.  HPI   21 year old male with history of cerebral palsy, recurrent seizures, neuromuscular disorder anxiety brought in by parents for evaluation of change in activities. Patient was doing well yesterday. This morning dad noticed that patient was having bowel incontinence which is not usual. His day clean him up and took him outside to play. Patient normally likes to play outside but only a short amount of time he signaled that he wants to go inside. He normally likes to eat but after eating one piece of chicken he doesn't want to eat anymore. For the past several hours, patient appears to be moaning crying, shivering and drawing his  legs towards his body.  This is different from his baseline and therefore patient brought here for further evaluation. Family has not notice any coughing or sneezing, no vomiting. No recent travel. No recent seizure activities. Level V caveats due to impaired baseline mental status.  Past Medical History:  Diagnosis Date  . Anxiety   . Cerebral palsy (HCC)   . Congenital quadriparesis (HCC)   . Neuromuscular disorder (HCC)   . Seizure (HCC)   . Seizures (HCC)    last seizure 3 yrs ago    Patient Active Problem List   Diagnosis Date Noted  . Restlessness and agitation 04/20/2015  . Hamstring tightness of both lower extremities 04/29/2014  . Moderate intellectual disabilities 02/24/2013  . History of congenital brain abnormality 02/24/2013  . Obstructive hydrocephalus 02/24/2013  . Congenital quadriplegia (HCC) 02/24/2013  . Generalized convulsive epilepsy (HCC) 02/24/2013  . Partial epilepsy with impairment of consciousness (HCC) 02/24/2013    Past Surgical History:  Procedure Laterality Date  . BRAIN SURGERY     Shunt  placed when he was a yr. old  . I&D EXTREMITY Bilateral 04/29/2014   Procedure: IRRIGATION AND DEBRIDEMENT EXTREMITY;  Surgeon: Eldred Manges, MD;  Location: [facility] OR;  Service: Orthopedics;  Laterality: Bilateral;  Right and Left Hamstring Lenghening and Fiberglass Casting  . LEG TENDON SURGERY Bilateral approx 6 yrs ago       Home Medications    Prior to Admission medications   Medication Sig Start Date End Date Taking? Authorizing Provider  CARBATROL 300 MG 12 hr capsule Take 1 capsule (300 mg total) by mouth 2 (two) times daily. 03/10/17   Elveria Rising, NP  cetirizine (ZYRTEC) 10 MG tablet Take 10 mg by mouth daily.    [provider]  DEPAKOTE SPRINKLES 125 MG capsule TAKE 5 CAPSULES BY MOUTH TWICE DAILY 03/10/17   Elveria Rising, NP  DIASTAT ACUDIAL 10 MG GEL INSERT 10MG  RECTALLY AT ONSET OF SEIZURE 04/30/16   Elveria Rising, NP  diazepam (VALIUM) 1 MG/ML solution Give 1 ml by mouth up to twice per day as needed for restlessness 04/20/15   Elveria Rising, NP    Family History Family History  Problem Relation Age of Onset  . Throat cancer Paternal Grandfather        Died at 81  . Stomach cancer Paternal Grandfather   . Stomach cancer Paternal Grandmother        Died at 32    Social History Social History  Substance Use Topics  . Smoking status: Never Smoker  . Smokeless tobacco: Never  Used  . Alcohol use No     Allergies   Patient has no known allergies.   Review of Systems Review of Systems  Unable to perform ROS: Patient nonverbal     Physical Exam Updated Vital Signs BP (!) 179/125 (BP Location: Right Arm) Comment: Pt yelling in pain and moving  Pulse (!) 102   SpO2 92%   Physical Exam  Constitutional: He appears distressed.  Thin appearing male appears uncomfortable and in rigors  HENT:  Unable to assess throat due to pt's uncooperation  Eyes: Pupils are equal, round, and reactive to light.  Neck: Normal range of motion. Neck supple.    No nuchal rigidity  Cardiovascular:  tachycardic  Pulmonary/Chest:  Mildly tachypneic.  No W/R/R  Abdominal: Soft. Bowel sounds are normal. There is no tenderness.  Neurological: GCS eye subscore is 4. GCS verbal subscore is 1. GCS motor subscore is 5.  Skin:  Laying in fetal position.  No overlying skin changes on body exam  Nursing note and vitals reviewed.    ED Treatments / Results  Labs (all labs ordered are listed, but only abnormal results are displayed) Labs Reviewed  COMPREHENSIVE METABOLIC PANEL - Abnormal; Notable for the following:       Result Value   Chloride 98 (*)    Glucose, Bld 122 (*)    All other components within normal limits  CBC WITH DIFFERENTIAL/PLATELET - Abnormal; Notable for the following:    Platelets 118 (*)    All other components within normal limits  I-STAT CG4 LACTIC ACID, ED - Abnormal; Notable for the following:    Lactic Acid, Venous 4.09 (*)    All other components within normal limits  CULTURE, BLOOD (ROUTINE X 2)  CULTURE, BLOOD (ROUTINE X 2)  URINALYSIS, ROUTINE W REFLEX MICROSCOPIC  VALPROIC ACID LEVEL  CK    EKG  EKG Interpretation None       Radiology Dg Chest Port 1 View  Result Date: 05/23/2017 CLINICAL DATA:  Possible sepsis EXAM: PORTABLE CHEST 1 VIEW COMPARISON:  03/04/2013 FINDINGS: Cardiac shadow is within normal limits. The lungs are well aerated bilaterally. Ventricular shunt catheter is identified and stable. IMPRESSION: No acute abnormality noted. Electronically Signed   By: Alcide Clever M.D.   On: 05/23/2017 13:16    Procedures Procedures (including critical care time)  Medications Ordered in ED Medications  vancomycin (VANCOCIN) IVPB 1000 mg/200 mL premix (1,000 mg Intravenous New Bag/Given 05/23/17 1448)  sodium chloride 0.9 % bolus 1,000 mL (1,000 mLs Intravenous New Bag/Given 05/23/17 1436)  morphine 2 MG/ML injection 2 mg (2 mg Intravenous Given 05/23/17 1436)  morphine 4 MG/ML injection 4 mg (4 mg  Intravenous Given 05/23/17 1254)  sodium chloride 0.9 % bolus 1,000 mL (0 mLs Intravenous Stopped 05/23/17 1437)  piperacillin-tazobactam (ZOSYN) IVPB 3.375 g (0 g Intravenous Stopped 05/23/17 1448)  LORazepam (ATIVAN) injection 1 mg (1 mg Intravenous Given 05/23/17 1516)  iopamidol (ISOVUE-300) 61 % injection 30 mL (30 mLs Oral Contrast Given 05/23/17 1511)     Initial Impression / Assessment and Plan / ED Course  I have reviewed the triage vital signs and the nursing notes.  Pertinent labs & imaging results that were available during my care of the patient were reviewed by me and considered in my medical decision making (see chart for details).     BP (!) 179/125 (BP Location: Right Arm) Comment: Pt yelling in pain and moving  Pulse 95   Temp 98.1 F (36.7  C) (Rectal)   SpO2 100%    Final Clinical Impressions(s) / ED Diagnoses   Final diagnoses:  None    New Prescriptions New Prescriptions   No medications on file   12:37 PM Pt with hx of cerebral palsy, presenting for pain and irritation. Difficult to obtain hx but pt appears uncomfortable, having rigors.  Septic work up initiated.  Will obtain CXR, UA.  Will consider abd/pelvis CT scan.  Suspect viral gi etiology since pt has loose stool earlier in the day.  No obvious nuchal rigidity to suggest meningitis.  Pt has VP shunt.  Site appears normal. Does consider seizure causing sxs.  Perhaps pt in postictal state.   1:58 PM Patient has an elevated lactic acid of 4.09 however he has normal WBC, and his rectal temp is 98.1. He does have history of seizure and he is tremulous however unsure if this is seizure related activity. Elevated lactic acid may be due to the persistent tremor and not sepsis.  Will check total CK to r/o rhabdo. However, plan to start broad-spectrum antibiotic including vancomycin and Zosyn to cover for potential infectious source causing his sxs.  Care discussed with Dr. Erma HeritageIsaacs.  IVF given.   2:49 PM Normal  renal function, will obtain abd/pelvis CT for further assessment.  Will also check Depakote level.    3:22 PM Pt sign out to oncoming provider who will f/u on abd/pelvis CT scan, repeat lactic acid, check CK level, and depakote level.  Pt likely benefit admission for observation.     Fayrene Helperran, [place] Jeff, PA-C 05/23/17 1524    Shaune PollackIsaacs, Cameron, MD 05/24/17 [phone]

## 2017-05-23 NOTE — ED Notes (Signed)
Pt pulled IV out 

## 2017-05-27 DIAGNOSIS — Z09 Encounter for follow-up examination after completed treatment for conditions other than malignant neoplasm: Secondary | ICD-10-CM | POA: Diagnosis not present

## 2017-05-28 LAB — CULTURE, BLOOD (ROUTINE X 2)
CULTURE: NO GROWTH
Culture: NO GROWTH
SPECIAL REQUESTS: ADEQUATE
Special Requests: ADEQUATE

## 2017-09-06 ENCOUNTER — Telehealth (INDEPENDENT_AMBULATORY_CARE_PROVIDER_SITE_OTHER): Payer: Self-pay | Admitting: Family

## 2017-09-06 DIAGNOSIS — G40309 Generalized idiopathic epilepsy and epileptic syndromes, not intractable, without status epilepticus: Secondary | ICD-10-CM

## 2017-09-06 DIAGNOSIS — G40209 Localization-related (focal) (partial) symptomatic epilepsy and epileptic syndromes with complex partial seizures, not intractable, without status epilepticus: Secondary | ICD-10-CM

## 2017-09-08 NOTE — Telephone Encounter (Signed)
Theodore Lewis, this patient has not been seen since last year. How would you like to proceed?

## 2017-09-08 NOTE — Telephone Encounter (Signed)
I sent a message to schedulers to schedule a follow up appointment. I will refill one time. Thanks, [name]

## 2017-09-08 NOTE — Telephone Encounter (Signed)
I called Mom Gertha CalkinJoan Rosman and asked her to call me back so that I can schedule his follow up appointment. TG

## 2017-09-09 DIAGNOSIS — Z23 Encounter for immunization: Secondary | ICD-10-CM | POA: Diagnosis not present

## 2017-09-09 NOTE — Telephone Encounter (Signed)
I called and left a message for Mom Aurea GraffJoan Blake asking her to call me back to schedule his follow up appointment. TG

## 2017-09-10 ENCOUNTER — Telehealth (INDEPENDENT_AMBULATORY_CARE_PROVIDER_SITE_OTHER): Payer: Self-pay | Admitting: Family

## 2017-09-10 NOTE — Telephone Encounter (Signed)
Mom called back today and I verified that he has an appointment on Mon Nov 12th. TG

## 2017-09-10 NOTE — Telephone Encounter (Signed)
°  Who's calling (name and relationship to patient) : Theodore Lewis (mom) Best contact number: [phone] Provider they see: Blane OharaGoodpasture Reason for call: Mom called and would like Mrs Theodore Lewis to call her before 1:00pm.  No details message left.     PRESCRIPTION REFILL ONLY  Name of prescription:  Pharmacy:

## 2017-09-10 NOTE — Telephone Encounter (Signed)
I called Mom back and confirmed with her that Theodore Lewis has a follow up appointment on Monday Nov 12th. TG

## 2017-09-15 ENCOUNTER — Encounter (INDEPENDENT_AMBULATORY_CARE_PROVIDER_SITE_OTHER): Payer: Self-pay | Admitting: Family

## 2017-09-15 ENCOUNTER — Ambulatory Visit (INDEPENDENT_AMBULATORY_CARE_PROVIDER_SITE_OTHER): Payer: Medicare Other | Admitting: Family

## 2017-09-15 VITALS — BP 106/68 | HR 68 | Wt 75.0 lb

## 2017-09-15 DIAGNOSIS — Z87728 Personal history of other specified (corrected) congenital malformations of nervous system and sense organs: Secondary | ICD-10-CM | POA: Diagnosis not present

## 2017-09-15 DIAGNOSIS — G911 Obstructive hydrocephalus: Secondary | ICD-10-CM | POA: Diagnosis not present

## 2017-09-15 DIAGNOSIS — G40209 Localization-related (focal) (partial) symptomatic epilepsy and epileptic syndromes with complex partial seizures, not intractable, without status epilepticus: Secondary | ICD-10-CM | POA: Diagnosis not present

## 2017-09-15 DIAGNOSIS — R451 Restlessness and agitation: Secondary | ICD-10-CM | POA: Diagnosis not present

## 2017-09-15 DIAGNOSIS — F71 Moderate intellectual disabilities: Secondary | ICD-10-CM

## 2017-09-15 DIAGNOSIS — G808 Other cerebral palsy: Secondary | ICD-10-CM | POA: Diagnosis not present

## 2017-09-15 DIAGNOSIS — G40309 Generalized idiopathic epilepsy and epileptic syndromes, not intractable, without status epilepticus: Secondary | ICD-10-CM

## 2017-09-15 MED ORDER — DIASTAT ACUDIAL 10 MG RE GEL: RECTAL | 5 refills | Status: DC

## 2017-09-15 MED ORDER — DEPAKOTE SPRINKLES 125 MG PO CSDR: DELAYED_RELEASE_CAPSULE | ORAL | 5 refills | Status: DC

## 2017-09-15 MED ORDER — CARBATROL 300 MG PO CP12: 300.0000 mg | ORAL_CAPSULE | Freq: Two times a day (BID) | ORAL | 5 refills | Status: DC

## 2017-09-15 MED ORDER — DIAZEPAM 1 MG/ML PO SOLN: ORAL | 5 refills | Status: DC

## 2017-09-15 NOTE — Patient Instructions (Signed)
Thank you for coming in today.   Instructions for you until your next appointment are as follows: 1. Continue giving Theodore Lewis's medications as you have been giving them.  2. Let me know if his seizures become more frequent or more severe.  3. Let me know if you have any questions or concerns.  4. Please sign up for MyChart if you have not done so 5. Please plan to return for follow up in one year or sooner if needed.

## 2017-09-15 NOTE — Progress Notes (Signed)
Patient: Theodore Lewis MRN: [id number] Sex: male DOB: 03/20/1996  Provider: Elveria Risingina Trishna Cwik, NP [facility]  Note type: Routine return visit  History of Present Illness: Referral Source: Dr. Bernadette HoitLawrence Puzio [facility] and [facility] parents Chief Complaint: Seizures/Congenital Quadriparesis   Theodore Lewis is a 21 y.o. young man with history of seizures, quadriparesis, and moderate cognitive disability. He was last seen April 30, 2016. 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 This required treatment by ventriculoperitoneal shunt. He has spastic quadriparesis, contractures, poor vision, evidence of an axial collection of mixed type in the right frontal temporal parietal region and possible infarction of the right brain with encephalomalacia of the left brain. [name]'s parents report today that he has intermittent increase in seizures, then they return to baseline. Theodore Lewis is taking and tolerating Carbatrol and Depakote Sprinkles for his seizure disorder. He sometimes resists taking the medications. His parents also report that he continues to have trouble sleeping, sometimes being awake in his bed in the middle of the night. He sleeps best when he has had a busy day. Theodore Lewis has a Psychologist, occupationalCAPP worker that takes him on outings, and Mom reports that this helps Theodore Lewis to sleep better at night. He will graduate from [employer] in June 2019, and Mom is not sure what he will do after that. She may enroll him in a day program, or may keep him at home with the CAPP worker.   Theodore Lewis was seen in the ER in July 2018 for agitation. Mom said that his symptoms resolved after he was given IV fluids. He has been otherwise healthy since he was last seen and his parents have no other health concerns for him today other than previously mentioned.   Review of Systems: Please see the HPI for neurologic  and other pertinent review of systems. Otherwise, all other systems were reviewed and were negative.    Past Medical History:  Diagnosis Date  . Anxiety   . Cerebral palsy (HCC)   . Congenital quadriparesis (HCC)   . Neuromuscular disorder (HCC)   . Seizure (HCC)   . Seizures (HCC)    last seizure 3 yrs ago   Hospitalizations: Yes.  , Head Injury: No., Nervous System Infections: No., Immunizations up to date: Yes.   Past Medical History Comments: He presented at few months of age with massive hydrocephalus. He required urgent placement of a ventriculoperitoneal shunt. He has experienced revisions in the past. Brain images in 2007 showed an extra-axial collection of mixed type in the right frontotemporal parietal region with possible infarction in the right brain and significant encephalomalacia of the left brain. Ventricles were well decompressed.  A CT scan of the brain and shunt series Mar 04, 2013 showed microcephaly with marked diffuse thickening of the calvarium and marked enlargement of the mastoid sinus bilaterally. Left frontal shunt catheter extends to the region of the third ventricle and is unchanged. No hydrocephalus. There is cerebellar atrophy bilaterally. 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 Brainstem is small. There is atrophy of the left occipital lobe. Agenesis of the corpus callosum.  Large extra-axial fluid collection on the right measures 4.6 x 11.1 cm. This has low density centrally and the wall has calcified since the prior study. The fluid collection has matured and become better defined since the prior study. There is some mass effect on the right  cerebral hemisphere. No midline shift to the left. No acute hemorrhage.  Shunt tubing was intact from the ventricles to the abdomen.   Surgical History Past Surgical History:  Procedure Laterality Date  . BRAIN SURGERY     Shunt placed when he was a yr. old  . LEG TENDON SURGERY  Bilateral approx 6 yrs ago    Family History family history includes Stomach cancer in his paternal grandfather and paternal grandmother; Throat cancer in his paternal grandfather. Family History is otherwise negative for migraines, seizures, cognitive impairment, blindness, deafness, birth defects, chromosomal disorder, autism.  Social History Social History   Socioeconomic History  . Marital status: Single    Spouse name: Not on file  . Number of children: Not on file  . Years of education: Not on file  . Highest education level: Not on file  Social Needs  . Financial resource strain: Not on file  . Food insecurity - worry: Not on file  . Food insecurity - inability: Not on file  . Transportation needs - medical: Not on file  . Transportation needs - non-medical: Not on file  Occupational History  . Not on file  Tobacco Use  . Smoking status: Never Smoker  . Smokeless tobacco: Never Used  Substance and Sexual Activity  . Alcohol use: No  . Drug use: No  . Sexual activity: No  Other Topics Concern  . Not on file  Social History Narrative  . Not on file    Allergies Allergies  Allergen Reactions  . Vancomycin Rash    Physical Exam BP 106/68   Pulse 68   Wt 75 lb (34 kg) Comment: per mother last weight  BMI 18.08 kg/m  General: thin young man, seated in wheel chair, in no evident distress; black hair, brown eyes, even handed Head: microcephalic with ventriculoperitoneal shunt palpable on the left side of his head. No dysmorphic features. Tympanic membranes normal. Unable to adequately evaluate his oropharynx due to his inability to cooperate Neck: supple with no carotid bruits.  Cardiovascular: regular rate and rhythm, no murmurs. Respiratory: Clear to auscultation bilaterally Abdomen: Bowel sounds present all four quadrants, abdomen soft, non-tender, non-distended. No hepatosplenomegaly or masses palpated. Musculoskeletal: Thin extremities with significant  muscle wasting. Significant for contractures at the hips, knees, ankles, elbows, wrists and shoulders. Skin: no rashes or neurocutaneous lesions  Neurologic Exam Mental Status: Awake and fully alert.  He is frequently moving, pulling at his parents and the examiner. He is resistant to invasions into his space and unable to follow commands. He has no language. Cranial Nerves: Fundoscopic exam - red reflex present.  Unable to fully visualize fundus.  Pupils equal briskly reactive to light.  Does not fix or follow light or objects. Turns to locate sounds in the periphery. Facial movement appears symmetrical. Neck flexion and extension normal. Motor: Increased tone in the extremities with tight heel cords and contractures at the knees left greater than right. Assessment was difficult due to his lack of cooperation. He has a strong grasp but clumsy and dystonic posturing in the left hand.  Sensory: Withdrawal x 4 Coordination: Poor coordination when reaching for objects Gait and Station: Unable to stand or bear weight Reflexes: 1+ and symmetric at the knees. Unable to assess other reflexes due to lack of cooperation.  Impression 1.  Localization related epilepsy with complex partial seizures 2.  Generalized convulsive epilepsy 3.  Acquired obstructive hydrocephalus with functioning VP shunt 4. Congential quadriplegia with sparing of the  right arm 5. Moderate intellectual disability 6. Episodic agitated behavior 7. Insomnia  Recommendations for plan of care The patient's previous [facility] were reviewed. Santonio has neither had nor required imaging or lab studies since the last visit. He is a 21 year old young man with history of seizures, quadriparesis and moderate intellectual disability. He is taking and tolerating Carbatrol and Depakote for his seizure disorder. He is doing fairly well at this time. I talked with his parents about his insomnia and about keeping him on a regular schedule as much  as possible. We talked about plans after he graduates from school in June, and I told them that a day program would likely be beneficial for him. I will see Vannak back in follow up in 1 year or sooner if needed. His parents agreed with the plans made today.   The medication list was reviewed and reconciled.  No changes were made in the prescribed medications today.  A complete medication list was provided to the patient's parents.  Allergies as of 09/15/2017      Reactions   Vancomycin Rash      Medication List        Accurate as of 09/15/17 11:59 PM. Always use your most recent med list.          CARBATROL 300 MG 12 hr capsule Generic drug:  carbamazepine Take 1 capsule (300 mg total) 2 (two) times daily by mouth.   cetirizine 10 MG tablet Commonly known as:  ZYRTEC Take 10 mg by mouth daily.   DEPAKOTE SPRINKLES 125 MG capsule Generic drug:  divalproex TAKE 5 CAPSULES BY MOUTH TWICE DAILY   DIASTAT ACUDIAL 10 MG Gel Generic drug:  diazepam INSERT 10MG  RECTALLY AT ONSET OF SEIZURE   diazepam 1 MG/ML solution Commonly known as:  VALIUM Give 1 ml by mouth up to twice per day as needed for restlessness   ibuprofen 100 MG/5ML suspension Commonly known as:  ADVIL,MOTRIN Take 20 mLs (400 mg total) by mouth every 4 (four) hours as needed.       Total time spent with the patient was 20 minutes, of which 50% or more was spent in counseling and coordination of care.   Elveria Rising NP-C

## 2017-09-18 ENCOUNTER — Encounter (INDEPENDENT_AMBULATORY_CARE_PROVIDER_SITE_OTHER): Payer: Self-pay | Admitting: Family

## 2017-10-11 ENCOUNTER — Other Ambulatory Visit (INDEPENDENT_AMBULATORY_CARE_PROVIDER_SITE_OTHER): Payer: Self-pay | Admitting: Family

## 2017-10-11 DIAGNOSIS — G40209 Localization-related (focal) (partial) symptomatic epilepsy and epileptic syndromes with complex partial seizures, not intractable, without status epilepticus: Secondary | ICD-10-CM

## 2017-10-11 DIAGNOSIS — G40309 Generalized idiopathic epilepsy and epileptic syndromes, not intractable, without status epilepticus: Secondary | ICD-10-CM

## 2018-02-13 ENCOUNTER — Telehealth (INDEPENDENT_AMBULATORY_CARE_PROVIDER_SITE_OTHER): Payer: Self-pay | Admitting: Orthopaedic Surgery

## 2018-02-13 NOTE — Telephone Encounter (Signed)
refaxed to DellekerKimberly.  I also wrote note stating patient is not seen in our office but [facility] and there are no chart notes here to be sent.

## 2018-02-13 NOTE — Telephone Encounter (Signed)
Theodore BradfordKimberly with [employer] called left voicemail message stating she faxed a physician Rx and request for chart notes. Also, she faxed over a form for wheelchair repair. The phone # is [phone]   The fax# is [phone] or [phone]

## 2018-03-04 NOTE — Telephone Encounter (Signed)
Cala Bradford from [employer] called inquiring if received the fax from her on 02/27/18.  Apparently they needed the diagnosis code, stating the reason why the patient is in a wheelchair and needed his signature on the paper.  Thank you.

## 2018-03-05 NOTE — Telephone Encounter (Signed)
Have you seen this paperwork?  I do not have it. Thanks.

## 2018-03-06 NOTE — Telephone Encounter (Signed)
noted 

## 2018-03-06 NOTE — Telephone Encounter (Signed)
I faxed this yesterday 5/2

## 2018-03-16 ENCOUNTER — Telehealth (INDEPENDENT_AMBULATORY_CARE_PROVIDER_SITE_OTHER): Payer: Self-pay | Admitting: Family

## 2018-03-16 DIAGNOSIS — G40309 Generalized idiopathic epilepsy and epileptic syndromes, not intractable, without status epilepticus: Secondary | ICD-10-CM

## 2018-03-16 DIAGNOSIS — G40209 Localization-related (focal) (partial) symptomatic epilepsy and epileptic syndromes with complex partial seizures, not intractable, without status epilepticus: Secondary | ICD-10-CM

## 2018-03-16 MED ORDER — CARBATROL 300 MG PO CP12: 300.0000 mg | ORAL_CAPSULE | Freq: Two times a day (BID) | ORAL | 5 refills | Status: DC

## 2018-03-16 MED ORDER — DEPAKOTE SPRINKLES 125 MG PO CSDR: DELAYED_RELEASE_CAPSULE | ORAL | 5 refills | Status: DC

## 2018-03-16 NOTE — Telephone Encounter (Signed)
Rx has been printed and placed on [name]'s desk 

## 2018-03-16 NOTE — Telephone Encounter (Signed)
Rx has been faxed to the pharmacy 

## 2018-04-10 ENCOUNTER — Other Ambulatory Visit: Payer: Self-pay | Admitting: Family Medicine

## 2018-04-10 ENCOUNTER — Ambulatory Visit
Admission: RE | Admit: 2018-04-10 | Discharge: 2018-04-10 | Disposition: A | Discharge status: Home / Self Care | Payer: Medicaid Other | Source: Ambulatory Visit | Attending: Family Medicine | Admitting: Family Medicine

## 2018-04-10 DIAGNOSIS — M25551 Pain in right hip: Secondary | ICD-10-CM

## 2018-04-13 ENCOUNTER — Other Ambulatory Visit (INDEPENDENT_AMBULATORY_CARE_PROVIDER_SITE_OTHER): Payer: Self-pay | Admitting: Family

## 2018-04-13 DIAGNOSIS — G40309 Generalized idiopathic epilepsy and epileptic syndromes, not intractable, without status epilepticus: Secondary | ICD-10-CM

## 2018-04-13 DIAGNOSIS — G40209 Localization-related (focal) (partial) symptomatic epilepsy and epileptic syndromes with complex partial seizures, not intractable, without status epilepticus: Secondary | ICD-10-CM

## 2018-07-13 ENCOUNTER — Other Ambulatory Visit (INDEPENDENT_AMBULATORY_CARE_PROVIDER_SITE_OTHER): Payer: Self-pay | Admitting: Family

## 2018-07-13 DIAGNOSIS — G40309 Generalized idiopathic epilepsy and epileptic syndromes, not intractable, without status epilepticus: Secondary | ICD-10-CM

## 2018-07-13 DIAGNOSIS — G40209 Localization-related (focal) (partial) symptomatic epilepsy and epileptic syndromes with complex partial seizures, not intractable, without status epilepticus: Secondary | ICD-10-CM

## 2018-08-11 ENCOUNTER — Other Ambulatory Visit (INDEPENDENT_AMBULATORY_CARE_PROVIDER_SITE_OTHER): Payer: Self-pay | Admitting: Family

## 2018-08-11 DIAGNOSIS — G40209 Localization-related (focal) (partial) symptomatic epilepsy and epileptic syndromes with complex partial seizures, not intractable, without status epilepticus: Secondary | ICD-10-CM

## 2018-08-11 DIAGNOSIS — G40309 Generalized idiopathic epilepsy and epileptic syndromes, not intractable, without status epilepticus: Secondary | ICD-10-CM

## 2018-09-11 ENCOUNTER — Other Ambulatory Visit (INDEPENDENT_AMBULATORY_CARE_PROVIDER_SITE_OTHER): Payer: Self-pay | Admitting: Family

## 2018-09-11 DIAGNOSIS — G40209 Localization-related (focal) (partial) symptomatic epilepsy and epileptic syndromes with complex partial seizures, not intractable, without status epilepticus: Secondary | ICD-10-CM

## 2018-09-11 DIAGNOSIS — G40309 Generalized idiopathic epilepsy and epileptic syndromes, not intractable, without status epilepticus: Secondary | ICD-10-CM

## 2018-10-13 ENCOUNTER — Other Ambulatory Visit (INDEPENDENT_AMBULATORY_CARE_PROVIDER_SITE_OTHER): Payer: Self-pay | Admitting: Family

## 2018-10-13 DIAGNOSIS — G40309 Generalized idiopathic epilepsy and epileptic syndromes, not intractable, without status epilepticus: Secondary | ICD-10-CM

## 2018-10-13 DIAGNOSIS — G40209 Localization-related (focal) (partial) symptomatic epilepsy and epileptic syndromes with complex partial seizures, not intractable, without status epilepticus: Secondary | ICD-10-CM

## 2018-10-13 NOTE — Telephone Encounter (Signed)
Please send to the pharmacy °

## 2018-10-14 ENCOUNTER — Telehealth (INDEPENDENT_AMBULATORY_CARE_PROVIDER_SITE_OTHER): Payer: Self-pay | Admitting: Family

## 2018-10-14 NOTE — Telephone Encounter (Signed)
Scheduled appointment with dad

## 2018-10-14 NOTE — Telephone Encounter (Signed)
-----   Message from Elveria Risingina Goodpasture, NP sent at 10/13/2018  2:59 PM EST ----- Regarding: Needs appointment Theodore BootyJoshua needs an appointment with me. Thanks, [name]

## 2018-10-23 ENCOUNTER — Encounter (INDEPENDENT_AMBULATORY_CARE_PROVIDER_SITE_OTHER): Payer: Self-pay | Admitting: Family

## 2018-10-23 ENCOUNTER — Ambulatory Visit (INDEPENDENT_AMBULATORY_CARE_PROVIDER_SITE_OTHER): Payer: Medicare Other | Admitting: Family

## 2018-10-23 VITALS — BP 120/70 | HR 68 | Wt 80.0 lb

## 2018-10-23 DIAGNOSIS — G40309 Generalized idiopathic epilepsy and epileptic syndromes, not intractable, without status epilepticus: Secondary | ICD-10-CM

## 2018-10-23 DIAGNOSIS — Z79899 Other long term (current) drug therapy: Secondary | ICD-10-CM

## 2018-10-23 DIAGNOSIS — R451 Restlessness and agitation: Secondary | ICD-10-CM

## 2018-10-23 DIAGNOSIS — G911 Obstructive hydrocephalus: Secondary | ICD-10-CM

## 2018-10-23 DIAGNOSIS — G808 Other cerebral palsy: Secondary | ICD-10-CM

## 2018-10-23 DIAGNOSIS — M629 Disorder of muscle, unspecified: Secondary | ICD-10-CM

## 2018-10-23 DIAGNOSIS — G40209 Localization-related (focal) (partial) symptomatic epilepsy and epileptic syndromes with complex partial seizures, not intractable, without status epilepticus: Secondary | ICD-10-CM

## 2018-10-23 DIAGNOSIS — F71 Moderate intellectual disabilities: Secondary | ICD-10-CM

## 2018-10-23 MED ORDER — DEPAKOTE SPRINKLES 125 MG PO CSDR: DELAYED_RELEASE_CAPSULE | ORAL | 5 refills | Status: DC

## 2018-10-23 MED ORDER — CARBATROL 300 MG PO CP12: 300.0000 mg | ORAL_CAPSULE | Freq: Two times a day (BID) | ORAL | 5 refills | Status: DC

## 2018-10-23 MED ORDER — DIAZEPAM 1 MG/ML PO SOLN: ORAL | 5 refills | Status: DC

## 2018-10-23 NOTE — Progress Notes (Signed)
Patient: Theodore Lewis MRN: [id number] Sex: male DOB: [birth year]  Provider: Elveria Rising, NP Location of Care: [facility]  Note type: Routine return visit  History of Present Illness: Referral Source: Bernadette Hoit, MD History from: both parents, patient and [facility] chart Chief Complaint: Seizures/Congenital Quadriparesis  Theodore Lewis is a 22 y.o. young man with history of seizures, quadriparesis and moderate cognitive disability. He was last seen September 15, 2017. Theodore Lewis has history of post-hemorrhagic hydrocephalus with delayed appearance until he was a couple of months out of the nursery. This was treated with VP shunt. He has spastic quadriparesis, contractures, poor vision, evidence of an axial collection collection of mixed type in the right frontal temporal parietal region and possible infarction of the right brain with encephalomalacia of the left brain. He is taking and tolerating Depakote and Carbamazepine for his seizure disorder. Today his parents report that he has not had obvious seizures since he was last seen but that he has events in which he cries out and "looks funny". His parents say that he is generally resistant to taking medicaiton but that they are usually able to get it in.    Theodore Lewis's parents also report that he seems to have intermittent pain in his right hip at times with diaper changes. They also say that he cries out at night and wonder if he has pain keeping him awake. He also generally has ongoing insomnia and tends to be awake at night, especially now that he is no longer in school. He is attending [employer] day program 3 days per week. He also has a Psychologist, occupational that works with him each day.   His parents are also concerned about his wheelchair fit. He tends to keep his right leg out of the wheelchair leg rest and there is concern about him bumping the leg on doorways or walls when he is mobile. Theodore Lewis is unable to stand or bear  weight. He is able to maneuver this wheelchair some but tends to run in to furniture, doorways and walls.   Parents report that Theodore Lewis is picky about foods that he will eat, and will not drink water unless Mom puts it in a syringe and puts it in his mouth. Mom said that he tried Pediasure when he was young and wouldn't drink it. Mom says that he refuses to eat at the day program and that she is working on finding foods that he will consume there.   Theodore Lewis's parents also report that he continues to be agitated in public places more than at home. At restaurants, he pulls and pushes at the table, cries out, is in constant movement and generally has to be removed because of being disruptive. I gave them Diazepam to try at his last visit and they have not tried it because of fear of side effects.   Mom retired from work this year and is caring for both Theodore Lewis and his father, who has a neuromuscular condition. She admits to feeling overwhelmed at times.   Theodore Lewis has been otherwise generally healthy since he was last seen. His parents have no other health concerns for him today other than previously mentioned.  Review of Systems: Please see the HPI for neurologic and other pertinent review of systems. Otherwise, all other systems were reviewed and were negative.    Past Medical History:  Diagnosis Date  . Anxiety   . Cerebral palsy (HCC)   . Congenital quadriparesis (HCC)   . Neuromuscular  disorder (HCC)   . Seizure (HCC)   . Seizures (HCC)    last seizure 3 yrs ago   Hospitalizations: No., Head Injury: No., Nervous System Infections: No., Immunizations up to date: Yes.   Past Medical History Comments: See HPI Copied from previous record:  He presented at few months of age with massive hydrocephalus. He required urgent placement of a ventriculoperitoneal shunt. He has experienced revisions in the past. Brain images in 2007 showed an extra-axial collection of mixed type in the right  frontotemporal parietal region with possible infarction in the right brain and significant encephalomalacia of the left brain. Ventricles were well decompressed.  A CT scan of the brain and shunt series Mar 04, 2013 showed microcephaly with marked diffuse thickening of the calvarium and marked enlargement of the mastoid sinus bilaterally. Left frontal shunt catheter extends to the region of the third ventricle and is unchanged. No hydrocephalus. There is cerebellar atrophy bilaterally. 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 Brainstem is small. There is atrophy of the left occipital lobe. Agenesis of the corpus callosum.  Large extra-axial fluid collection on the right measures 4.6 x 11.1 cm. This has low density centrally and the wall has calcified since the prior study. The fluid collection has matured and become better defined since the prior study. There is some mass effect on the right cerebral hemisphere. No midline shift to the left. No acute hemorrhage.  Shunt tubing was intact from the ventricles to the abdomen.   Surgical History Past Surgical History:  Procedure Laterality Date  . BRAIN SURGERY     Shunt placed when he was a yr. old  . I&D EXTREMITY Bilateral 04/29/2014   Procedure: IRRIGATION AND DEBRIDEMENT EXTREMITY;  Surgeon: Eldred Manges, MD;  Location: [facility] OR;  Service: Orthopedics;  Laterality: Bilateral;  Right and Left Hamstring Lenghening and Fiberglass Casting  . LEG TENDON SURGERY Bilateral approx 6 yrs ago    Family History family history includes Healthy in his mother; Stomach cancer in his paternal grandfather and paternal grandmother; Throat cancer in his paternal grandfather. Family History is otherwise negative for migraines, seizures, cognitive impairment, blindness, deafness, birth defects, chromosomal disorder, autism.  Social History Social History   Socioeconomic History  . Marital status: Single    Spouse name: Not on  file  . Number of children: Not on file  . Years of education: Not on file  . Highest education level: Not on file  Occupational History  . Not on file  Social Needs  . Financial resource strain: Not on file  . Food insecurity:    Worry: Not on file    Inability: Not on file  . Transportation needs:    Medical: Not on file    Non-medical: Not on file  Tobacco Use  . Smoking status: Never Smoker  . Smokeless tobacco: Never Used  Substance and Sexual Activity  . Alcohol use: No  . Drug use: No  . Sexual activity: Never  Lifestyle  . Physical activity:    Days per week: Not on file    Minutes per session: Not on file  . Stress: Not on file  Relationships  . Social connections:    Talks on phone: Not on file    Gets together: Not on file    Attends religious service: Not on file    Active member of club or organization: Not on file    Attends meetings of clubs or organizations: Not on  file    Relationship status: Not on file  Other Topics Concern  . Not on file  Social History Narrative  . Not on file    Allergies Allergies  Allergen Reactions  . Vancomycin Rash    Physical Exam BP 120/70   Pulse 68   Wt 80 lb (36.3 kg)   BMI 19.29 kg/m  General: well developed, well nourished young man, seated in wheelchair, in no evident distress; black hair, brown eyes, even handed Head: microcephalic and atraumatic. VP shunt is palpable on left side of head. Unable to adequately examine oropharynx because of his lack of cooperation but it appears benign.  No dysmorphic features. Neck: supple with no carotid bruits. Cardiovascular: regular rate and rhythm, no murmurs. Respiratory: Clear to auscultation bilaterally Abdomen: Bowel sounds present all four quadrants, abdomen soft, non-tender, non-distended. No hepatosplenomegaly or masses palpated. Musculoskeletal: Thin extremities with significant muscle wasting. Has contractures at hips, knees, ankles, elbows, wrists and  shoulders. His right hip protrudes more than the left and his right leg is held at about a 30 degree angle from his midline Skin: no rashes or neurocutaneous lesions  Neurologic Exam Mental Status: Awake and fully alert. Has no language.  Smiles responsively. Moves frequently, pulling on examiner and his parents. He ran his wheelchair into the door and the wall several times. Unable to follow commands. Resistant to invasions in to his space Cranial Nerves: Fundoscopic exam - red reflex present.  Unable to fully visualize fundus.  Pupils equal briskly reactive to light.  Does not turn to localize faces and objects in the periphery. Turns to localize sounds in the periphery. Facial movements are symmetric. Neck flexion and extension normal. Motor: Spastic quadriparesis with increased tone in the lower extremities. He has contractures at the knees and ankles. He has strong but clumsy grasp, with dystonic posturing in the left hand. Assessment was difficult due to his inability to cooperate Sensory: Withdrawal x 4 Coordination: Unable to adequately assess due to patient's inability to participate in examination. No dysmetria when reaching for objects. Gait and Station: Unable to stand and bear weight.  Reflexes: Unable to adequately assess due to his inability to cooperate. No clonus  Impression 1.  Localization related epilepsy with complex partial seizures 2.  Generalized convulsive epilepsy 3.  Acquired obstructive hydrocephaly with functional VP shunt 4. 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  Moderate intellectual disability 6.  Episodic agitated behavior 7. Insomnia  Recommendations for plan of care [facility] Ivin BootyJoshua has neither had nor required imaging or lab studies since the last visit. He is a 22 year old young man with history of seizures, spastic quadriparesis, VP shunt for obstructive hydrocephalus and moderate intellectual  disability. He is taking and tolerating Carbatrol and Depakote for his seizure disorder. He has had some behaviors that may be seizure activity, so I recommended that we obtain Carbamazepine and Depakote levels. I will call his parents when the results are available.   We talked about his right hip being likely not seated in the joint, and his parents decided against performing an x-ray to confirm that. Mom plans to work on ways to change his diaper to minimize abducting the hip. I asked her to let me know if he has behavior that would indicate more hip discomfort. We also talked about possible discomfort at night and I recommended giving him a dose of Diazepam before bedtime to see if that helps to relax  his muscles and help him to sleep better. I will also refer him for wheelchair evaluation to see if his wheelchair can be modified to accommodate his right leg position. I am very concerned that the right leg is going to be injured since it is outside of the leg rest.   We talked about [place]'s agitation in public places and I encouraged his parents to try the Diazepam to see if it helps him to be away from home. It is not clear to me if his refusal to eat at the day program is similar to being in a restaurant or if he doesn't like the foods offered. I suspect that he is anxious when in crowded places. I will also refer him to see the dietician Annabelle HarmanKat Rouse at his next visit. I am concerned that he may be losing weight, and gave Mom some samples of Pediasure to offer to [place]. We talked about other ways to increase his calorie intake, such as frequent small snacks.   Finally, we talked about his needs from a caregiver, and I encouraged Mom to get sufficient rest in order to care for [facility]. I will see Ivin BootyJoshua back in follow up in 6 months or sooner if needed. His parents agreed with the plans made today.  The medication list was reviewed and reconciled.  I reviewed changes that were made in the prescribed  medications today.  A complete medication list was provided to his mother.  Allergies as of 10/23/2018      Reactions   Vancomycin Rash      Medication List       Accurate as of October 23, 2018  2:07 PM. Always use your most recent med list.        CARBATROL 300 MG 12 hr capsule Generic drug:  carbamazepine TAKE 1 CAPSULE BY MOUTH TWICE DAILY   cetirizine 10 MG tablet Commonly known as:  ZYRTEC Take 10 mg by mouth daily.   DEPAKOTE SPRINKLES 125 MG capsule Generic drug:  divalproex TAKE 5 CAPSULES BY MOUTH TWICE DAILY   diazepam 1 MG/ML solution Commonly known as:  VALIUM Give 1 ml at bedtime and give 1ml by mouth up to twice per day as needed for restlessness   diazepam 10 MG Gel Commonly known as:  DIASTAT ACUDIAL INSERT 10 MG RECTALY AT ONSET OF SEIZURE   ibuprofen 100 MG/5ML suspension Commonly known as:  ADVIL,MOTRIN Take 20 mLs (400 mg total) by mouth every 4 (four) hours as needed.       Dr. Sharene SkeansHickling was consulted regarding the patient.   Total time spent with the patient was 45 minutes, of which 50% or more was spent in counseling and coordination of care.   Elveria Risingina Evaline Waltman NP-C

## 2018-10-23 NOTE — Patient Instructions (Signed)
Thank you for coming in today.   Instructions for you until your next appointment are as follows: 1. I have given you a lab order to check the seizure medicine levels. Please take him to the lab in the morning before his first dose of medication of the day. Give the medicine after the blood has been drawn. I will call you when I receive the results.  2. For his night time spasms, start giving him 1ml of the Diazepam at bedtime. That should help to reduce muscle spasms and help him to sleep better. You can also give him 1ml during the day when you have a social event to help him to be calmer.  3. I have given you some Pediasure samples. Let me know if he tolerates them and then we will order for him . 4. I will order a wheelchair evaluation for him. You will receive a call from [facility] about that.  5. Please plan to return for follow up in 6 months or sooner if needed. When you return, I will ask the dietician to see Theodore Lewis as well since we will have a wheelchair scale at that time.

## 2018-10-29 ENCOUNTER — Telehealth (INDEPENDENT_AMBULATORY_CARE_PROVIDER_SITE_OTHER): Payer: Self-pay | Admitting: Family

## 2018-10-29 NOTE — Telephone Encounter (Signed)
°  Who's calling (name and relationship to patient) : Chartered certified accountantCheri Courtright, Physical Therapist  Best contact number: [phone]  Provider they see: Elveria Risingina Goodpasture  Reason for call: Physical [facility], and would like to discuss more about the care plan with [name], please call back between 12-2pm.     PRESCRIPTION REFILL ONLY  Name of prescription:  Pharmacy:

## 2018-10-30 NOTE — Telephone Encounter (Signed)
Was not able to contact the PT. Will try again Monday

## 2018-11-03 NOTE — Telephone Encounter (Signed)
Cheri called me back. I talked with her about how Theodore BootyJoshua positions his right leg. She said that she does not do wheelchair evaluations but that she could work with him about the leg. I agreed with that plan. TG

## 2018-11-03 NOTE — Telephone Encounter (Signed)
L/M requesting a call back from [place].

## 2018-11-03 NOTE — Telephone Encounter (Signed)
I called and left a message for Cheri. I will call her again later today or on Thursday. TG

## 2018-11-03 NOTE — Telephone Encounter (Signed)
Cherri returning call. She stated she would be available 5 min before each hour and on Thursday between 1 and 3pm.

## 2018-11-04 LAB — CBC WITH DIFFERENTIAL/PLATELET
ABSOLUTE MONOCYTES: 471 {cells}/uL (ref 200–950)
Basophils Absolute: 11 cells/uL (ref 0–200)
Basophils Relative: 0.3 %
Eosinophils Absolute: 42 cells/uL (ref 15–500)
Eosinophils Relative: 1.1 %
HEMATOCRIT: 44.4 % (ref 38.5–50.0)
Hemoglobin: 15.2 g/dL (ref 13.2–17.1)
LYMPHS ABS: 954 {cells}/uL (ref 850–3900)
MCH: 31.1 pg (ref 27.0–33.0)
MCHC: 34.2 g/dL (ref 32.0–36.0)
MCV: 91 fL (ref 80.0–100.0)
MPV: 11 fL (ref 7.5–12.5)
Monocytes Relative: 12.4 %
NEUTROS ABS: 2322 {cells}/uL (ref 1500–7800)
Neutrophils Relative %: 61.1 %
Platelets: 114 10*3/uL — ABNORMAL LOW (ref 140–400)
RBC: 4.88 10*6/uL (ref 4.20–5.80)
RDW: 13.8 % (ref 11.0–15.0)
Total Lymphocyte: 25.1 %
WBC: 3.8 10*3/uL (ref 3.8–10.8)

## 2018-11-04 LAB — ALT: ALT: 16 U/L (ref 9–46)

## 2018-11-04 LAB — VALPROIC ACID LEVEL: VALPROIC ACID LVL: 72.7 mg/L (ref 50.0–100.0)

## 2018-11-04 LAB — CARBAMAZEPINE LEVEL, TOTAL: Carbamazepine Lvl: 7.5 mg/L (ref 4.0–12.0)

## 2018-11-10 ENCOUNTER — Telehealth (INDEPENDENT_AMBULATORY_CARE_PROVIDER_SITE_OTHER): Payer: Self-pay | Admitting: Family

## 2018-11-10 NOTE — Telephone Encounter (Signed)
Trudy received our referral for patient to receive physical therapy. She requested insurance information. I sent the insurance information to her per her request. Rufina Falco

## 2018-11-10 NOTE — Telephone Encounter (Signed)
°  Who's calling (name and relationship to patient) : Cordelia Pen with [facility] Best contact number: [phone] Provider they see: Elveria Rising Reason for call: They received a referral from [place]. They do not accept Medicaid or Medicare.      PRESCRIPTION REFILL ONLY  Name of prescription:  Pharmacy:

## 2018-11-11 ENCOUNTER — Telehealth (INDEPENDENT_AMBULATORY_CARE_PROVIDER_SITE_OTHER): Payer: Self-pay | Admitting: Family

## 2018-11-11 NOTE — Telephone Encounter (Signed)
I resent the referral to [facility]

## 2018-11-11 NOTE — Telephone Encounter (Signed)
I called Mom and left a message regarding recent lab results. I invited her to call back if she has questions or concerns. TG

## 2018-11-24 ENCOUNTER — Other Ambulatory Visit (INDEPENDENT_AMBULATORY_CARE_PROVIDER_SITE_OTHER): Payer: Self-pay | Admitting: Family

## 2018-11-24 DIAGNOSIS — G40309 Generalized idiopathic epilepsy and epileptic syndromes, not intractable, without status epilepticus: Secondary | ICD-10-CM

## 2018-11-24 DIAGNOSIS — G40209 Localization-related (focal) (partial) symptomatic epilepsy and epileptic syndromes with complex partial seizures, not intractable, without status epilepticus: Secondary | ICD-10-CM

## 2019-01-13 ENCOUNTER — Telehealth (INDEPENDENT_AMBULATORY_CARE_PROVIDER_SITE_OTHER): Payer: Self-pay | Admitting: Family

## 2019-01-13 DIAGNOSIS — G40209 Localization-related (focal) (partial) symptomatic epilepsy and epileptic syndromes with complex partial seizures, not intractable, without status epilepticus: Secondary | ICD-10-CM

## 2019-01-13 DIAGNOSIS — G40309 Generalized idiopathic epilepsy and epileptic syndromes, not intractable, without status epilepticus: Secondary | ICD-10-CM

## 2019-01-13 MED ORDER — DIAZEPAM 10 MG RE GEL: RECTAL | 1 refills | Status: DC

## 2019-01-13 NOTE — Telephone Encounter (Signed)
Rx has been faxed to the pharmacy 

## 2019-05-10 ENCOUNTER — Other Ambulatory Visit (INDEPENDENT_AMBULATORY_CARE_PROVIDER_SITE_OTHER): Payer: Self-pay | Admitting: Family

## 2019-05-10 DIAGNOSIS — G40209 Localization-related (focal) (partial) symptomatic epilepsy and epileptic syndromes with complex partial seizures, not intractable, without status epilepticus: Secondary | ICD-10-CM

## 2019-05-10 DIAGNOSIS — G40309 Generalized idiopathic epilepsy and epileptic syndromes, not intractable, without status epilepticus: Secondary | ICD-10-CM

## 2019-05-12 IMAGING — CT CT HEAD W/O CM
3 of 4 series · 14 of 47 positions shown, 16 images · non-contrast
Comparison: 03/04/2013

CLINICAL DATA: Altered mental status.  VP shunt.

EXAM:
CT HEAD WITHOUT CONTRAST
TECHNIQUE: Contiguous axial images were obtained from the base of the skull
through the vertex without intravenous contrast.

[Series 2: head w/o · axial · non-contrast · 0.53mm/px · z∈[+1568,+1708]mm · 8 of 34 slices shown, 10 images]
[im 3/34  brain]
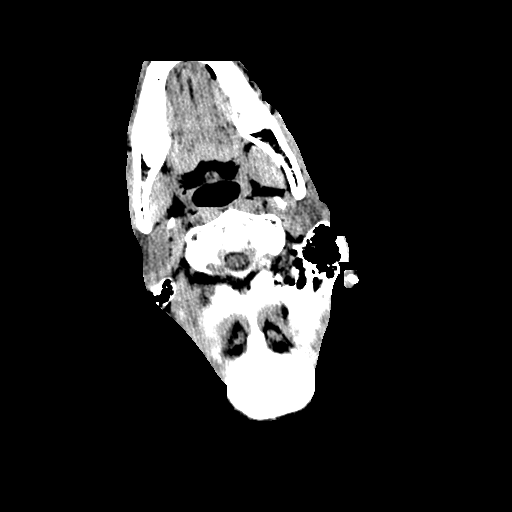
[im 3/34  bone]
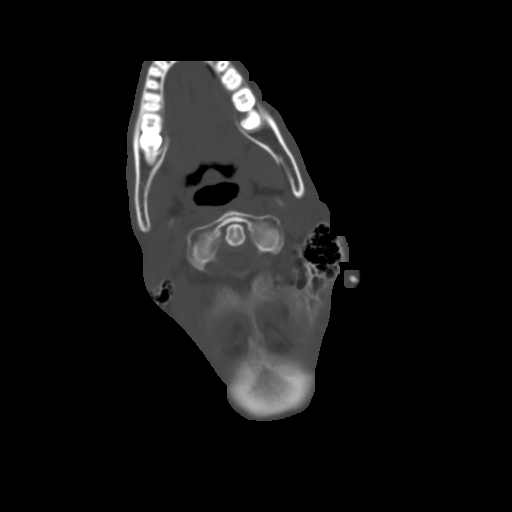
[im 8/34  brain]
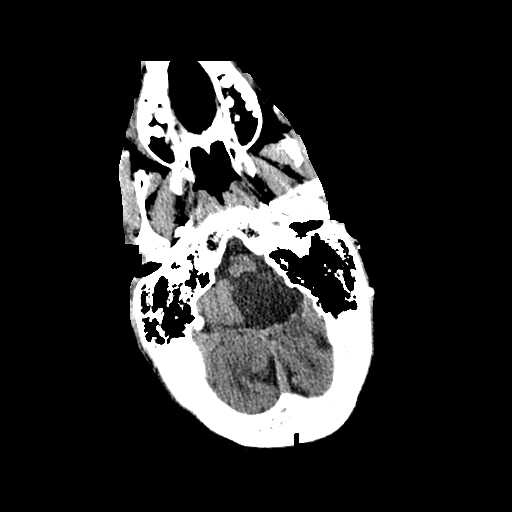
[im 12/34  brain]
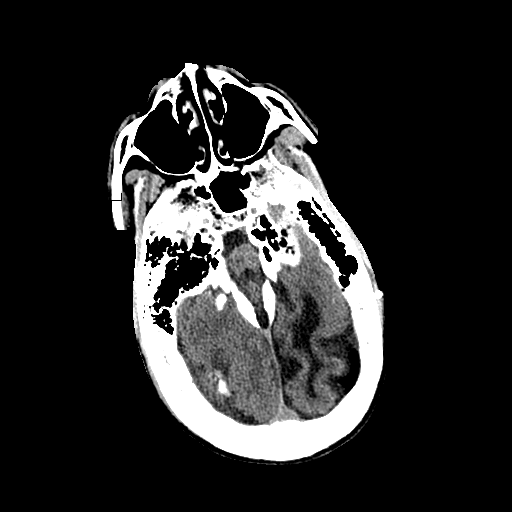
[im 15/34  brain]
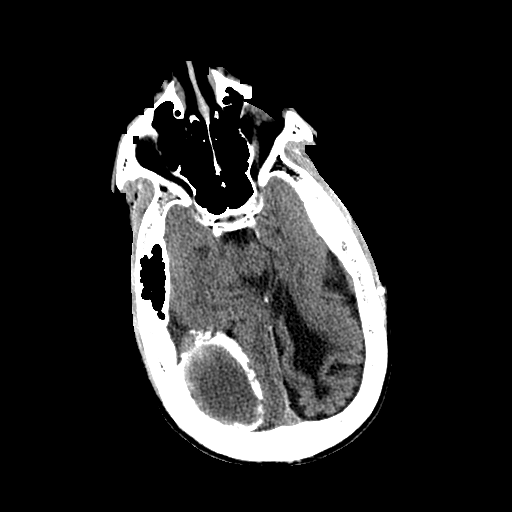
[im 19/34  brain]
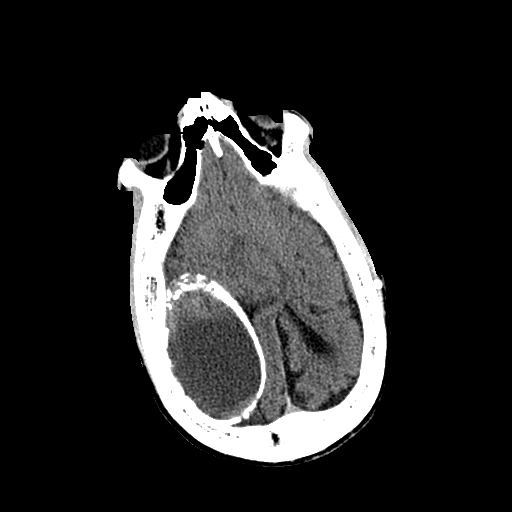
[im 19/34  bone]
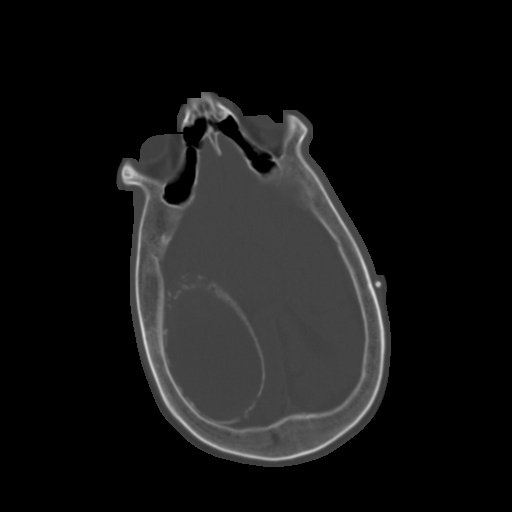
[im 22/34  brain]
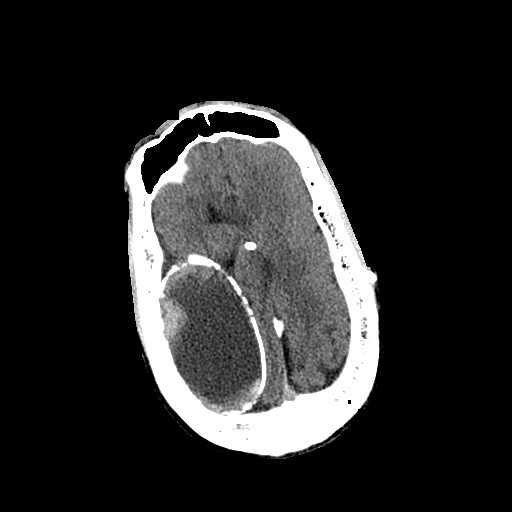
[im 26/34  brain]
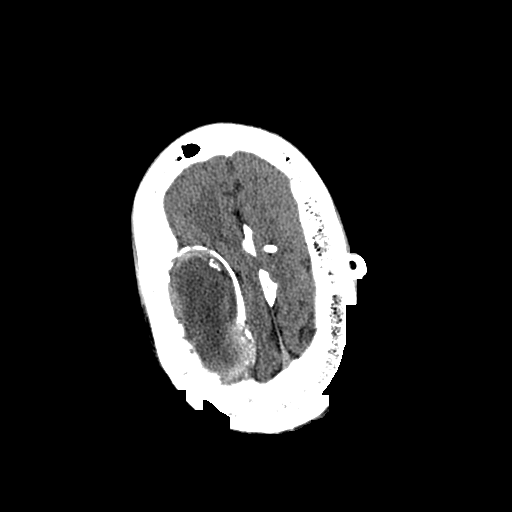
[im 31/34  brain]
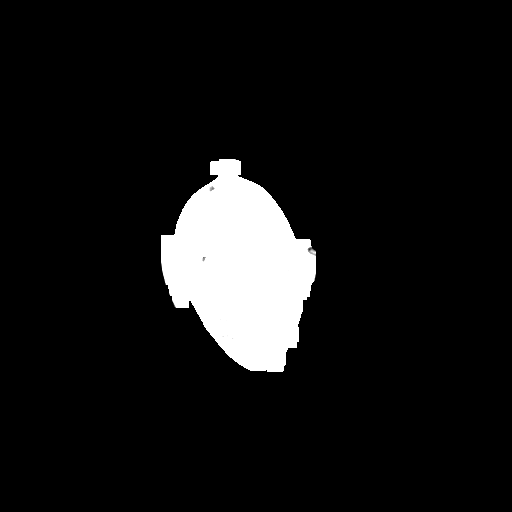

[Series 4: coronal · coronal · 0.33mm/px · 3 of 84 slices shown]
[im 28/84  brain]
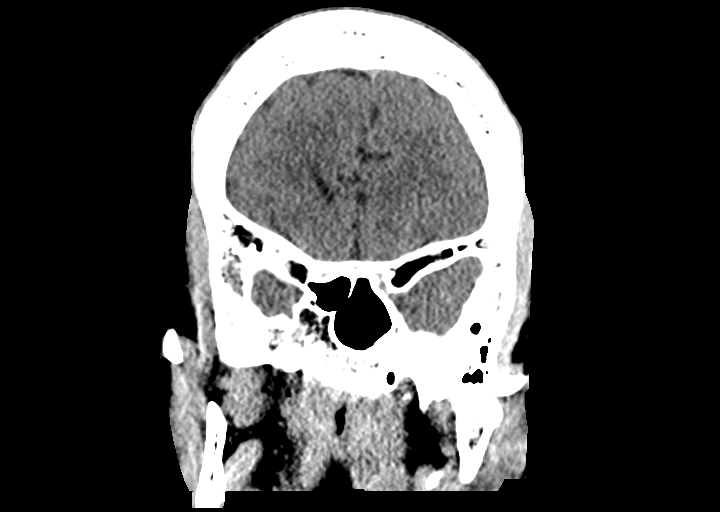
[im 37/84  brain]
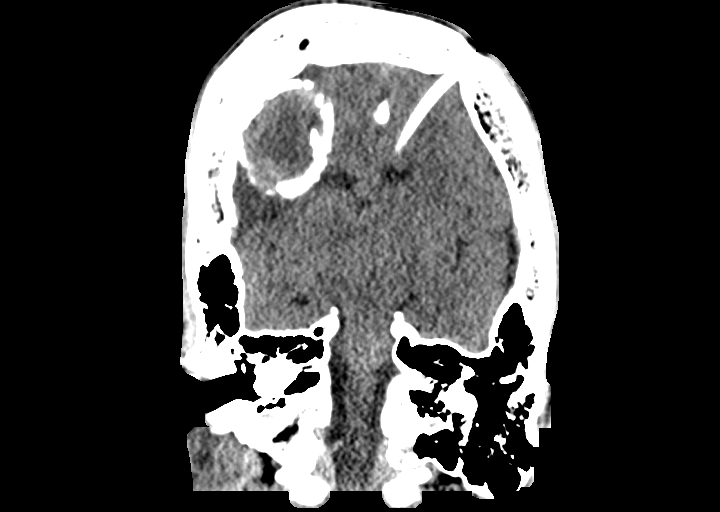
[im 47/84  brain]
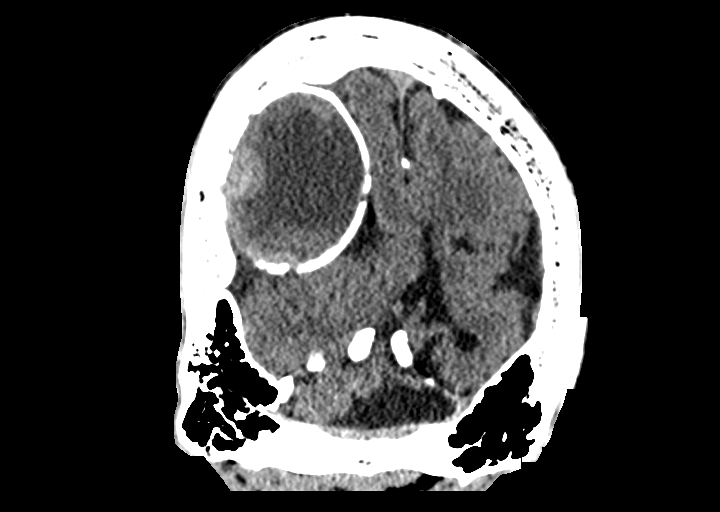

[Series 5: sagittal · sagittal · 0.33mm/px · 3 of 77 slices shown]
[im 26/77  brain]
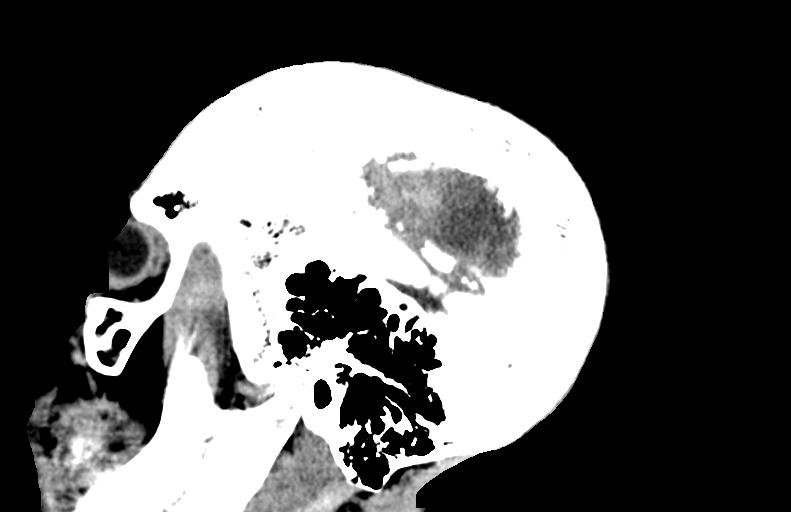
[im 39/77  brain]
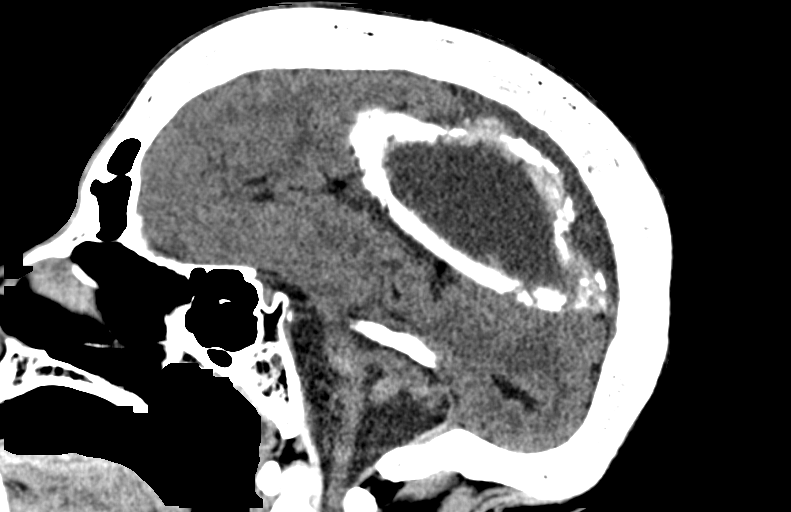
[im 51/77  brain]
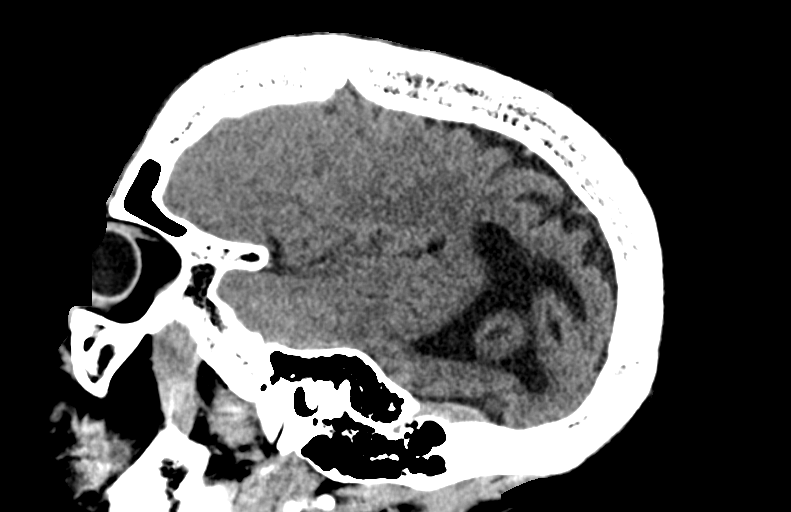

[14 of 47 positions shown; findings below may reference images not displayed]

FINDINGS: Brain: Chronic brain malformation and atrophy. No sign of acute
brain process. VP shunt enters from a left frontoparietal approach
shin appears the same. No sign of shunt malfunction or
hydrocephalus. Chronic peripherally calcified subdural collection on
the right has chronically seen. This appears similar with a length
of 11.7 cm, cephalo caudal measurement of 6 cm and right-to-left
measurement of 4.7 cm. No acute intracranial finding.

Vascular: No abnormal vascular finding.

Skull: No acute calvarial finding.

Sinuses/Orbits: Clear/negative

Other: None significant
IMPRESSION: No acute finding. Chronic microcephaly and brain atrophy. Chronic
calcified subdural collection on the right, not significantly
changed since 03/04/2013. No evidence of shunt malfunction. Sinuses
clear.

## 2019-06-11 ENCOUNTER — Other Ambulatory Visit (INDEPENDENT_AMBULATORY_CARE_PROVIDER_SITE_OTHER): Payer: Self-pay | Admitting: Family

## 2019-06-11 DIAGNOSIS — G40209 Localization-related (focal) (partial) symptomatic epilepsy and epileptic syndromes with complex partial seizures, not intractable, without status epilepticus: Secondary | ICD-10-CM

## 2019-06-11 DIAGNOSIS — G40309 Generalized idiopathic epilepsy and epileptic syndromes, not intractable, without status epilepticus: Secondary | ICD-10-CM

## 2019-06-11 MED ORDER — DEPAKOTE SPRINKLES 125 MG PO CSDR: DELAYED_RELEASE_CAPSULE | ORAL | 0 refills | Status: DC

## 2019-06-11 MED ORDER — CARBATROL 300 MG PO CP12: 300.0000 mg | ORAL_CAPSULE | Freq: Two times a day (BID) | ORAL | 0 refills | Status: DC

## 2019-07-13 ENCOUNTER — Other Ambulatory Visit (INDEPENDENT_AMBULATORY_CARE_PROVIDER_SITE_OTHER): Payer: Self-pay | Admitting: Family

## 2019-07-13 DIAGNOSIS — G40209 Localization-related (focal) (partial) symptomatic epilepsy and epileptic syndromes with complex partial seizures, not intractable, without status epilepticus: Secondary | ICD-10-CM

## 2019-07-13 DIAGNOSIS — G40309 Generalized idiopathic epilepsy and epileptic syndromes, not intractable, without status epilepticus: Secondary | ICD-10-CM

## 2019-07-13 MED ORDER — CARBATROL 300 MG PO CP12: 300.0000 mg | ORAL_CAPSULE | Freq: Two times a day (BID) | ORAL | 5 refills | Status: DC

## 2019-07-13 MED ORDER — DEPAKOTE SPRINKLES 125 MG PO CSDR: DELAYED_RELEASE_CAPSULE | ORAL | 5 refills | Status: DC

## 2019-09-13 ENCOUNTER — Telehealth (INDEPENDENT_AMBULATORY_CARE_PROVIDER_SITE_OTHER): Payer: Self-pay | Admitting: Radiology

## 2019-09-13 DIAGNOSIS — G40209 Localization-related (focal) (partial) symptomatic epilepsy and epileptic syndromes with complex partial seizures, not intractable, without status epilepticus: Secondary | ICD-10-CM

## 2019-09-13 DIAGNOSIS — G40309 Generalized idiopathic epilepsy and epileptic syndromes, not intractable, without status epilepticus: Secondary | ICD-10-CM

## 2019-09-13 MED ORDER — DIAZEPAM 10 MG RE GEL: RECTAL | 5 refills | Status: DC

## 2019-09-13 NOTE — Telephone Encounter (Signed)
Done. TG 

## 2019-09-13 NOTE — Telephone Encounter (Signed)
  Who's calling (name and relationship to patient) : Demetre Monaco (Mom)   Best contact number:  [phone]  Provider they see: Rockwell [place]   Reason for call:  Mom called to advise that Theodore Lewis is almost out of the Diastat, he has 2 doses left. Please send refill    PRESCRIPTION REFILL ONLY  Name of prescription: Diastat   Pharmacy: [facility]  [place] [place], [place]

## 2019-09-20 ENCOUNTER — Ambulatory Visit (INDEPENDENT_AMBULATORY_CARE_PROVIDER_SITE_OTHER): Payer: Medicare Other | Admitting: Family

## 2019-09-20 ENCOUNTER — Other Ambulatory Visit: Payer: Self-pay

## 2019-09-20 ENCOUNTER — Encounter (INDEPENDENT_AMBULATORY_CARE_PROVIDER_SITE_OTHER): Payer: Self-pay | Admitting: Family

## 2019-09-20 DIAGNOSIS — G40209 Localization-related (focal) (partial) symptomatic epilepsy and epileptic syndromes with complex partial seizures, not intractable, without status epilepticus: Secondary | ICD-10-CM

## 2019-09-20 DIAGNOSIS — R451 Restlessness and agitation: Secondary | ICD-10-CM | POA: Diagnosis not present

## 2019-09-20 DIAGNOSIS — G40309 Generalized idiopathic epilepsy and epileptic syndromes, not intractable, without status epilepticus: Secondary | ICD-10-CM | POA: Diagnosis not present

## 2019-09-20 MED ORDER — DIAZEPAM 1 MG/ML PO SOLN: ORAL | 5 refills | Status: DC

## 2019-09-20 MED ORDER — CARBATROL 300 MG PO CP12: 300.0000 mg | ORAL_CAPSULE | Freq: Two times a day (BID) | ORAL | 5 refills | Status: DC

## 2019-09-20 MED ORDER — DEPAKOTE SPRINKLES 125 MG PO CSDR: DELAYED_RELEASE_CAPSULE | ORAL | 5 refills | Status: DC

## 2019-09-20 MED ORDER — DIAZEPAM 10 MG RE GEL: RECTAL | 5 refills | Status: DC

## 2019-09-20 NOTE — Progress Notes (Signed)
Theodore Lewis   MRN:  [id number]  February 26, 1996   Provider: Rockwell Germany NP-C Location of Care: [facility]  Visit type: Routine visit  Last visit: 10/23/2018  Referral source: Letitia Libra, MD History from: mom, patient, and chcn chat  Brief history:  History of seizures, quadriparesis and moderate intellectual disability 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 This was treated with VP shunt. He has resultant spastic quadriparesis, contractures, poor vision, evidence of an axial collection of mixed type in the right frontal-temporal-parietal region and possible infarction of the right brain with encephalomalacia of the left brain. He is taking and tolerating Depakote and Carbatrol for his seizure disorder. He is cared for at home by his parents, but his father has significant health problems that is limiting his ability to provide physical care.   Today's concerns:  Mom reports today that Theodore Lewis has been doing well overall. He has occasional brief seizures that do not require intervention. Mom notes that he can be a picky eater and has lost some weight. She says that he likes to eat chicken nuggets, will occasionally eat things from his parents plate, such as fries or deep fried shrimp. He likes finger foods in generally rather than being fed. He doesn't like vegetables for the most part. He eats oatmeal for breakfast most mornings. Mom feels that he tends to swallow anything offered to him from a spoon, rather than attempting to chew or manipulate it in any way. She denies that he chokes or has any trouble swallowing it. Mom has tried Engineer, maintenance but says that Theodore Lewis will not drink it.   Theodore Lewis has intermittent agitated behavior, typically occurring in public places more so than at home. Mom says that this has not been problematic because they family has not been out much since the Covid 19  pandemic.   Mom says that Theodore Lewis has been generally healthy since he was last seen. Mom has no other health concerns for him today other than previously mentioned.   Review of systems: Please see HPI for neurologic and other pertinent review of systems. Otherwise all other systems were reviewed and were negative.  Problem List: Patient Active Problem List   Diagnosis Date Noted  . Restlessness and agitation 04/20/2015  . Hamstring tightness of both lower extremities 04/29/2014  . Moderate intellectual disabilities 02/24/2013  . History of congenital brain abnormality 02/24/2013  . Obstructive hydrocephalus ([place]) 02/24/2013  . Congenital quadriplegia ([place]) 02/24/2013  . Generalized convulsive epilepsy ([place]) 02/24/2013  . Partial epilepsy with impairment of consciousness ([place]) 02/24/2013     Past Medical History:  Diagnosis Date  . Anxiety   . Cerebral palsy ([place])   . Congenital quadriparesis ([place])   . Neuromuscular disorder ([place])   . Seizure ([place])   . Seizures ([place])    last seizure 3 yrs ago    Past medical history comments: See HPI Copied from previous record: He presented at few months of age with massive hydrocephalus. He required urgent placement of a ventriculoperitoneal shunt. He has experienced revisions in the past. Brain images in 2007 showed an extra-axial collection of mixed type in the right frontotemporal parietal region with possible infarction in the right brain and significant encephalomalacia of the left brain. Ventricles were well decompressed.  A CT scan of the brain and shunt series Mar 04, 2013 showed microcephaly with marked diffuse thickening of the calvarium and marked enlargement of  the mastoid sinus bilaterally. Left frontal shunt catheter extends to the region of the third ventricle and is unchanged. No hydrocephalus. There is cerebellar atrophy bilaterally. 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 Brainstem is small.  There is atrophy of the left occipital lobe. Agenesis of the corpus callosum.  Large extra-axial fluid collection on the right measures 4.6 x 11.1 cm. This has low density centrally and the wall has calcified since the prior study. The fluid collection has matured and become better defined since the prior study. There is some mass effect on the right cerebral hemisphere. No midline shift to the left. No acute hemorrhage.  Shunt tubing was intact from the ventricles to the abdomen.  Surgical history: Past Surgical History:  Procedure Laterality Date  . BRAIN SURGERY     Shunt placed when he was a yr. old  . I&D EXTREMITY Bilateral 04/29/2014   Procedure: IRRIGATION AND DEBRIDEMENT EXTREMITY;  Surgeon: Eldred Manges, MD;  Location: [facility] OR;  Service: Orthopedics;  Laterality: Bilateral;  Right and Left Hamstring Lenghening and Fiberglass Casting  . LEG TENDON SURGERY Bilateral approx 6 yrs ago     Family history: family history includes Healthy in his mother; Stomach cancer in his paternal grandfather and paternal grandmother; Throat cancer in his paternal grandfather.   Social history: Social History   Socioeconomic History  . Marital status: Single    Spouse name: Not on file  . Number of children: Not on file  . Years of education: Not on file  . Highest education level: Not on file  Occupational History  . Not on file  Social Needs  . Financial resource strain: Not on file  . Food insecurity    Worry: Not on file    Inability: Not on file  . Transportation needs    Medical: Not on file    Non-medical: Not on file  Tobacco Use  . Smoking status: Never Smoker  . Smokeless tobacco: Never Used  Substance and Sexual Activity  . Alcohol use: No  . Drug use: No  . Sexual activity: Never  Lifestyle  . Physical activity    Days per week: Not on file    Minutes per session: Not on file  . Stress: Not on file  Relationships  . Social Musician on phone: Not on file     Gets together: Not on file    Attends religious service: Not on file    Active member of club or organization: Not on file    Attends meetings of clubs or organizations: Not on file    Relationship status: Not on file  . Intimate partner violence    Fear of current or ex partner: Not on file    Emotionally abused: Not on file    Physically abused: Not on file    Forced sexual activity: Not on file  Other Topics Concern  . Not on file  Social History Narrative  . Not on file     Past/failed meds:   Allergies: Allergies  Allergen Reactions  . Vancomycin Rash      Immunizations:  There is no immunization history on file for this patient.    Diagnostics/Screenings: 05/23/2017 - CT head wo contrast - No acute finding. Chronic microcephaly and brain atrophy. Chronic calcified subdural collection on the right, not significantly changed since 03/04/2013. No evidence of shunt malfunction. Sinuses clear   Physical Exam: BP 110/70   Pulse 80  Wt 71 lb 6.4 oz (32.4 kg)   BMI 17.22 kg/m   General: small for age, thin young man, seated in wheelchair, in no evident distress; black hair, brown eyes, even handed Head: microcephalic and atraumatic. Oropharynx difficult to examine due to his inability to cooperate but appears benign. VP shunt is palpable on the left side of his head. No dysmorphic features Neck: supple with no carotid bruits. Cardiovascular: regular rate and rhythm, no murmurs. Respiratory: Clear to auscultation bilaterally Abdomen: Bowel sounds present all four quadrants, abdomen soft, non-tender, non-distended. No hepatosplenomegaly or masses palpated. Musculoskeletal: Thin extremities with significant muscle wasting. Has contractures and increased tone at the hips, knees, ankles, shoulders, elbows.  His right hip protrudes more than the left and the right leg is held at about a 30 degree angle from the midline. Skin: no rashes or neurocutaneous lesions   Neurologic Exam Mental Status: Awake and fully alert. Has no language. Took my hand several times and attempted to get me to push his wheelchair. Did a sign language once indicating that he wanted food. Smiles responsively. Resistant to invasions into his space Cranial Nerves: Fundoscopic exam - red reflex present.  Unable to fully visualize fundus.  Pupils equal briskly reactive to light.  Turns to localize faces and objects in the periphery. Turns to localize sounds in the periphery. Facial movements are symmetric.  Normal head control. Motor: Spastic quadriparesis with increased tone in the lower extremities. He has contractures at the knees and ankles. He has clumsy grasp but is able to manipulate his wheelchair at times. Has greater function with right arm and hand than left.  Sensory: Withdrawal x 4 Coordination: Unable to adequately assess due to patient's inability to participate in examination. No dysmetria when reaching for objects. Gait and Station: Unable to stand and bear weight.  Reflexes: Unable to adequately assess due to his inability to cooperate with examination.  Impression: 1. Localization related epilepsy with complex partial seizures 2. Generalized convulsive epilepsy 3. Acquired obstructive hydrocephalus with functional VP shunt 4. 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 Moderate intellectual disability 6. Episodic agitated behavior 7. Insomnia  Recommendations for plan of care: The patient's previous [facility] records were reviewed. Theodore Lewis has neither had nor required imaging or lab studies since the last visit. He is a 23 year old young man with history of post hemorrhagic hydrocephalus requiring VP shunt, spastic quadriparesis, epilepsy and moderate intellectual disability. He is taking and tolerating Depakote and Carbatrol for his seizure disorder. He has occasional brief seizures that do not require intervention. Issa has lost weight since his last visit  and I talked with Mom at some length about this. We talked about ways to increase calories and I explained that weight loss is concerning because he is also losing muscle mass, as well as being generally malnourished. Mom is somewhat overwhelmed because Theodore Lewis's father is having health problems of his own, and she is caring for both of them at home. I would like for Roma to be evaluated by the dietician but Mom wants to work on increasing his calories at home for now. I will see Demarie back in follow up in 6 months or sooner if needed. Mom agreed with the plans made today.   Wt Readings from Last 3 Encounters:  09/20/19 71 lb 6.4 oz (32.4 kg)  10/23/18 80 lb (36.3 kg)  09/15/17 75 lb (34 kg)    The medication list was reviewed and reconciled. No changes were made  in the prescribed medications today. A complete medication list was provided to the patient.  Allergies as of 09/20/2019      Reactions   Vancomycin Rash      Medication List       Accurate as of September 20, 2019 12:22 PM. If you have any questions, ask your nurse or doctor.        Carbatrol 300 MG 12 hr capsule Generic drug: carbamazepine Take 1 capsule (300 mg total) by mouth 2 (two) times daily.   cetirizine 10 MG tablet Commonly known as: ZYRTEC Take 10 mg by mouth daily.   Depakote Sprinkles 125 MG capsule Generic drug: divalproex TAKE 5 CAPSULES BY MOUTH TWICE DAILY   diazepam 1 MG/ML solution Commonly known as: VALIUM Give 1 ml at bedtime and give 1ml by mouth up to twice per day as needed for restlessness   diazepam 10 MG Gel Commonly known as: DIASTAT ACUDIAL INSERT 10 MG RECTALLY AT ONSET OF SEIZURE   ibuprofen 100 MG/5ML suspension Commonly known as: ADVIL Take 20 mLs (400 mg total) by mouth every 4 (four) hours as needed.       Total time spent with the patient was 30 minutes, of which 50% or more was spent in counseling and coordination of care.  Elveria Risingina Drury Ardizzone NP-C [facility] Ph. [phone] Fax [phone]

## 2019-09-20 NOTE — Patient Instructions (Signed)
Thank you for coming in today.   Instructions for you until your next appointment are as follows: 1. Continue giving Deantae Depakote and Carbatrol as you have been doing.  2. Let me know if his seizures become more frequent or more severe.  3. Work on getting him to eat more foods and more calories. If this isn't working, please let me know and I will refer Vineet to the dietician at this office.  4. Please sign up for MyChart if you have not done so 5. Please plan to return for follow up in 6 months or sooner if needed.

## 2019-12-24 ENCOUNTER — Other Ambulatory Visit (INDEPENDENT_AMBULATORY_CARE_PROVIDER_SITE_OTHER): Payer: Self-pay | Admitting: Family

## 2019-12-24 DIAGNOSIS — G40209 Localization-related (focal) (partial) symptomatic epilepsy and epileptic syndromes with complex partial seizures, not intractable, without status epilepticus: Secondary | ICD-10-CM

## 2019-12-24 DIAGNOSIS — G40309 Generalized idiopathic epilepsy and epileptic syndromes, not intractable, without status epilepticus: Secondary | ICD-10-CM

## 2019-12-24 MED ORDER — DEPAKOTE SPRINKLES 125 MG PO CSDR: DELAYED_RELEASE_CAPSULE | ORAL | 5 refills | Status: DC

## 2019-12-24 NOTE — Telephone Encounter (Signed)
Who's calling (name and relationship to patient) : Theodore Lewis (mom)  Best contact number: [phone]  Provider they see: Elveria Rising  Reason for call:  Mom called in stating that the Depakote she received was 270 tablets instead of the 310. [place] pharmacy told her that Medicaid would need a new RX for that 310 to be covered. Please advise   Call ID:      PRESCRIPTION REFILL ONLY  Name of prescription: Depakote   Pharmacy: [id number]

## 2019-12-24 NOTE — Telephone Encounter (Signed)
Please send to the pharmacy. I do not see where they received the wrong amount.

## 2020-03-20 ENCOUNTER — Encounter (INDEPENDENT_AMBULATORY_CARE_PROVIDER_SITE_OTHER): Payer: Self-pay | Admitting: Family

## 2020-03-20 ENCOUNTER — Other Ambulatory Visit: Payer: Self-pay

## 2020-03-20 ENCOUNTER — Ambulatory Visit (INDEPENDENT_AMBULATORY_CARE_PROVIDER_SITE_OTHER): Payer: Medicare Other | Admitting: Family

## 2020-03-20 VITALS — BP 110/74 | HR 76 | Wt 75.0 lb

## 2020-03-20 DIAGNOSIS — G40209 Localization-related (focal) (partial) symptomatic epilepsy and epileptic syndromes with complex partial seizures, not intractable, without status epilepticus: Secondary | ICD-10-CM | POA: Diagnosis not present

## 2020-03-20 DIAGNOSIS — F71 Moderate intellectual disabilities: Secondary | ICD-10-CM

## 2020-03-20 DIAGNOSIS — G808 Other cerebral palsy: Secondary | ICD-10-CM

## 2020-03-20 DIAGNOSIS — R451 Restlessness and agitation: Secondary | ICD-10-CM

## 2020-03-20 DIAGNOSIS — G40309 Generalized idiopathic epilepsy and epileptic syndromes, not intractable, without status epilepticus: Secondary | ICD-10-CM | POA: Diagnosis not present

## 2020-03-20 NOTE — Progress Notes (Signed)
Theodore Lewis   MRN:  [id number]  Jun 23, 1996   Provider: Elveria Rising NP-C Location of Care: [facility]  Visit type: Routine visit  Last visit: 09/20/2019  Referral source: Bernadette Hoit, MD History from: mom, patient, and chcn chart  Brief history:  Copied from previous record: History of seizures, quadriparesis and moderate intellectual disability 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 This was treated with VP shunt. He has resultant spastic quadriparesis, contractures, poor vision, evidence of an axial collection of mixed type in the right frontal-temporal-parietal region and possible infarction of the right brain with encephalomalacia of the left brain. He is taking and tolerating Depakote and Carbatrol for his seizure disorder. He is cared for at home by his parents, but his father has significant health problems that is limiting his ability to provide physical care.   Today's concerns:  Mom reports today that Theodore Lewis has about 2 brief seizures per month but some months he has no seizures. She said that he continues to have intermittent agitated behavior but that she is usually able to redirect him. Mom says that Theodore Lewis doesn't sleep well and that sometimes he stays awake until early morning hours. She admits that sometimes he is exhausting to care for, especially now that his father also has health problems that require her attention as well.   When Theodore Lewis was last seen, he had lost weight. Mom has worked to get him to eat more foods and he has gained a few pounds today.   Mom says that Theodore Lewis has received the Covid vaccine and that he tolerated it well. She says that Theodore Lewis has been otherwise generally healthy and she has no other health concerns for him today other than previously mentioned.   Review of systems: Please see HPI for neurologic and other pertinent review of  systems. Otherwise all other systems were reviewed and were negative.  Problem List: Patient Active Problem List   Diagnosis Date Noted  . Restlessness and agitation 04/20/2015  . Hamstring tightness of both lower extremities 04/29/2014  . Moderate intellectual disabilities 02/24/2013  . History of congenital brain abnormality 02/24/2013  . Obstructive hydrocephalus (HCC) 02/24/2013  . Congenital quadriplegia (HCC) 02/24/2013  . Generalized convulsive epilepsy (HCC) 02/24/2013  . Partial epilepsy with impairment of consciousness (HCC) 02/24/2013     Past Medical History:  Diagnosis Date  . Anxiety   . Cerebral palsy (HCC)   . Congenital quadriparesis (HCC)   . Neuromuscular disorder (HCC)   . Seizure (HCC)   . Seizures (HCC)    last seizure 3 yrs ago    Past medical history comments: See HPI Copied from previous record: He presented at few months of age with massive hydrocephalus. He required urgent placement of a ventriculoperitoneal shunt. He has experienced revisions in the past. Brain images in 2007 showed an extra-axial collection of mixed type in the right frontotemporal parietal region with possible infarction in the right brain and significant encephalomalacia of the left brain. Ventricles were well decompressed.  A CT scan of the brain and shunt series Mar 04, 2013 showed microcephaly with marked diffuse thickening of the calvarium and marked enlargement of the mastoid sinus bilaterally. Left frontal shunt catheter extends to the region of the third ventricle and is unchanged. No hydrocephalus. There is cerebellar atrophy bilaterally. 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 Brainstem is small. There is atrophy of  the left occipital lobe. Agenesis of the corpus callosum.  Large extra-axial fluid collection on the right measures 4.6 x 11.1 cm. This has low density centrally and the wall has calcified since the prior study. The fluid  collection has matured and become better defined since the prior study. There is some mass effect on the right cerebral hemisphere. No midline shift to the left. No acute hemorrhage. Shunt tubing was intact from the ventricles to the abdomen.  Surgical history: Past Surgical History:  Procedure Laterality Date  . BRAIN SURGERY     Shunt placed when he was a yr. old  . I & D EXTREMITY Bilateral 04/29/2014   Procedure: IRRIGATION AND DEBRIDEMENT EXTREMITY;  Surgeon: Eldred Manges, MD;  Location: [facility] OR;  Service: Orthopedics;  Laterality: Bilateral;  Right and Left Hamstring Lenghening and Fiberglass Casting  . LEG TENDON SURGERY Bilateral approx 6 yrs ago     Family history: family history includes Healthy in his mother; Stomach cancer in his paternal grandfather and paternal grandmother; Throat cancer in his paternal grandfather.   Social history: Social History   Socioeconomic History  . Marital status: Single    Spouse name: Not on file  . Number of children: Not on file  . Years of education: Not on file  . Highest education level: Not on file  Occupational History  . Not on file  Tobacco Use  . Smoking status: Never Smoker  . Smokeless tobacco: Never Used  Substance and Sexual Activity  . Alcohol use: No  . Drug use: No  . Sexual activity: Never  Other Topics Concern  . Not on file  Social History Narrative  . Not on file   Social Determinants of Health   Financial Resource Strain:   . Difficulty of Paying Living Expenses:   Food Insecurity:   . Worried About Programme researcher, broadcasting/film/video in the Last Year:   . Barista in the Last Year:   Transportation Needs:   . Freight forwarder (Medical):   Marland Kitchen Lack of Transportation (Non-Medical):   Physical Activity:   . Days of Exercise per Week:   . Minutes of Exercise per Session:   Stress:   . Feeling of Stress :   Social Connections:   . Frequency of Communication with Friends and Family:   . Frequency of Social  Gatherings with Friends and Family:   . Attends Religious Services:   . Active Member of Clubs or Organizations:   . Attends Banker Meetings:   Marland Kitchen Marital Status:   Intimate Partner Violence:   . Fear of Current or Ex-Partner:   . Emotionally Abused:   Marland Kitchen Physically Abused:   . Sexually Abused:       Past/failed meds:   Allergies: Allergies  Allergen Reactions  . Vancomycin Rash     Immunizations:  There is no immunization history on file for this patient.    Diagnostics/Screenings: 05/23/2017 - CT head wo contrast - No acute finding. Chronic microcephaly and brain atrophy. Chronic calcified subdural collection on the right, not significantly changed since 03/04/2013. No evidence of shunt malfunction. Sinuses clear   Physical Exam: BP 110/74   Pulse 76   Wt 75 lb (34 kg)   BMI 18.08 kg/m   General: small for age and thin, but otherwise well developed, well nourished young man, seated in wheelchair, in no evident distress; black hair, brown eyes, even handed Head: microcephalic and atraumatic. Oropharynx difficult to examine  due to his inability to cooperate with examination but appears benign. No dysmorphic features. Neck: supple Cardiovascular: regular rate and rhythm, no murmurs. Respiratory: Clear to auscultation bilaterally Abdomen: Bowel sounds present all four quadrants, abdomen soft, non-tender, non-distended. No hepatosplenomegaly or masses palpated. Musculoskeletal: Thin extremities with significant muscle wasting. Has contractures and increased tone at the hips, knees, ankles, and elbows. His right hip protrudes more than the left. The right leg is held at about a 30 degree angle from the midline. Skin: no rashes or neurocutaneous lesions  Neurologic Exam Mental Status: Awake and fully alert. Has no language. Took my hand several times and attempted to get me to push his wheelchair. Did sign language repeatedly asking for food. Resistant to  invasions into his space Cranial Nerves: Fundoscopic exam - red reflex present.  Unable to fully visualize fundus.  Pupils equal briskly reactive to light.  Turns to localize faces and objects in the periphery. Turns to localize sounds in the periphery. Facial movements are symmetric. Motor: Spastic quadriparesis with increased tone in the lower extremities. He has contractures at the hips, knees, ankles and elbows. He has clumsy grasp but is able to manipulate his wheelchair at times. Has greater function with the right arm than the left.  Sensory: Withdrawal x 4 Coordination: Unable to adequately assess due to patient's inability to participate in examination. No dysmetria when reaching for objects. Gait and Station: Unable to stand and bear weight. Reflexes: Unable to adequately assess due to the patient's inability to participate in examination.  Impression: 1. Localization related epilepsy with complex partial seizures 2. Generalized convulsive epilepsy 3. Acquired obstructive hydrocephalus with functioning VP shunt 4. Spastic quadriparesis with sparing of the right arm 5. Moderate intellectual disability 6. Episodic agitated behavior 7. Insomnia  Recommendations for plan of care: The patient's previous [facility] were reviewed. Chucky has neither had nor required imaging or lab studies since the last visit. He is a 24 year old young man with history of post hemorrhagic hydrocephalus requiring VP shunt, spastic quadriparesis, epilepsy and moderate disability. He is taking and tolerating Depakote and Carbatrol for his seizure disorder, and has occasional brief seizures that do not require intervention. He is completely dependent upon his parents for all ADL's. I talked with his mother about the seizures and asked her to let me know if he has increase in seizure frequency or if the seizures become more severe. Zyair has gained some weight but remains quite thin. I reminded Mom of the need for  him to consume adequate calories in meals and snacks. I will see him back in follow up in 6 months or sooner if needed.   Wt Readings from Last 3 Encounters:  03/20/20 75 lb (34 kg)  09/20/19 71 lb 6.4 oz (32.4 kg)  10/23/18 80 lb (36.3 kg)    The medication list was reviewed and reconciled. No changes were made in the prescribed medications today. A complete medication list was provided to the patient.  Allergies as of 03/20/2020      Reactions   Vancomycin Rash      Medication List       Accurate as of Mar 20, 2020 11:48 AM. If you have any questions, ask your nurse or doctor.        Carbatrol 300 MG 12 hr capsule Generic drug: carbamazepine Take 1 capsule (300 mg total) by mouth 2 (two) times daily.   cetirizine 10 MG tablet Commonly known as: ZYRTEC Take 10 mg by  mouth daily.   Depakote Sprinkles 125 MG capsule Generic drug: divalproex TAKE 5 CAPSULES BY MOUTH TWICE DAILY   diazepam 1 MG/ML solution Commonly known as: VALIUM Give 1 ml at bedtime and give 85ml by mouth up to twice per day as needed for restlessness   diazepam 10 MG Gel Commonly known as: DIASTAT ACUDIAL INSERT 10 MG RECTALLY AT ONSET OF SEIZURE   ibuprofen 100 MG/5ML suspension Commonly known as: ADVIL Take 20 mLs (400 mg total) by mouth every 4 (four) hours as needed.      Total time spent with the patient was 20 minutes, of which 50% or more was spent in counseling and coordination of care.  Elveria Rising NP-C [facility] Ph. [phone] Fax [phone]

## 2020-03-22 ENCOUNTER — Encounter (INDEPENDENT_AMBULATORY_CARE_PROVIDER_SITE_OTHER): Payer: Self-pay | Admitting: Family

## 2020-03-22 MED ORDER — DEPAKOTE SPRINKLES 125 MG PO CSDR: DELAYED_RELEASE_CAPSULE | ORAL | 5 refills | Status: DC

## 2020-03-22 MED ORDER — CARBATROL 300 MG PO CP12: 300.0000 mg | ORAL_CAPSULE | Freq: Two times a day (BID) | ORAL | 5 refills | Status: DC

## 2020-03-22 NOTE — Patient Instructions (Signed)
Thank you for coming in today.   Instructions for you until your next appointment are as follows: 1. Continue giving Akili the seizure medicines as you have been doing 2. Let me know if his seizures become more frequent or more severe 3. Megan has gained a few pounds but remains thin, Continue working with him to eat enough each day. If he has not gained by his next appointment, we will need to refer him to the dietician.  4. Please sign up for MyChart if you have not done so 5. Please plan to return for follow up in 6 months or sooner if needed.

## 2020-04-04 ENCOUNTER — Ambulatory Visit: Payer: Medicare Other | Attending: Internal Medicine

## 2020-04-04 DIAGNOSIS — Z23 Encounter for immunization: Secondary | ICD-10-CM

## 2020-04-04 NOTE — Progress Notes (Signed)
   Covid-19 Vaccination Clinic  Name:  ALIX STOWERS    MRN: [id number] DOB: [birth year]  04/04/2020  Mr. Hannum was observed post Covid-19 immunization for 15 minutes without incident. He was provided with Vaccine Information Sheet and instruction to access the V-Safe system.   Mr. Ciszewski was instructed to call 911 with any severe reactions post vaccine: Marland Kitchen Difficulty breathing  . Swelling of face and throat  . A fast heartbeat  . A bad rash all over body  . Dizziness and weakness   Immunizations Administered    Name Date Dose VIS Date Route   Pfizer COVID-19 Vaccine 04/04/2020  4:38 PM 0.3 mL 12/29/2018 Intramuscular   Manufacturer: [employer], [employer]   Lot: [id number]   NDC: [address]-2

## 2020-04-25 ENCOUNTER — Other Ambulatory Visit (INDEPENDENT_AMBULATORY_CARE_PROVIDER_SITE_OTHER): Payer: Self-pay | Admitting: Family

## 2020-04-25 DIAGNOSIS — G40309 Generalized idiopathic epilepsy and epileptic syndromes, not intractable, without status epilepticus: Secondary | ICD-10-CM

## 2020-04-25 DIAGNOSIS — G40209 Localization-related (focal) (partial) symptomatic epilepsy and epileptic syndromes with complex partial seizures, not intractable, without status epilepticus: Secondary | ICD-10-CM

## 2020-04-25 MED ORDER — DIAZEPAM 10 MG RE GEL: RECTAL | 5 refills | Status: DC

## 2020-06-25 ENCOUNTER — Other Ambulatory Visit (INDEPENDENT_AMBULATORY_CARE_PROVIDER_SITE_OTHER): Payer: Self-pay | Admitting: Family

## 2020-06-25 DIAGNOSIS — G40309 Generalized idiopathic epilepsy and epileptic syndromes, not intractable, without status epilepticus: Secondary | ICD-10-CM

## 2020-06-25 DIAGNOSIS — G40209 Localization-related (focal) (partial) symptomatic epilepsy and epileptic syndromes with complex partial seizures, not intractable, without status epilepticus: Secondary | ICD-10-CM

## 2020-06-26 NOTE — Telephone Encounter (Signed)
Please send to the pharmacy °

## 2020-07-26 ENCOUNTER — Other Ambulatory Visit (INDEPENDENT_AMBULATORY_CARE_PROVIDER_SITE_OTHER): Payer: Self-pay | Admitting: Family

## 2020-07-26 DIAGNOSIS — G40309 Generalized idiopathic epilepsy and epileptic syndromes, not intractable, without status epilepticus: Secondary | ICD-10-CM

## 2020-07-26 DIAGNOSIS — G40209 Localization-related (focal) (partial) symptomatic epilepsy and epileptic syndromes with complex partial seizures, not intractable, without status epilepticus: Secondary | ICD-10-CM

## 2020-07-26 MED ORDER — CARBATROL 300 MG PO CP12: 300.0000 mg | ORAL_CAPSULE | Freq: Two times a day (BID) | ORAL | 5 refills | Status: DC

## 2020-07-26 NOTE — Telephone Encounter (Signed)
Please send to the pharmacy °

## 2020-07-31 ENCOUNTER — Other Ambulatory Visit (INDEPENDENT_AMBULATORY_CARE_PROVIDER_SITE_OTHER): Payer: Self-pay

## 2020-07-31 DIAGNOSIS — G40309 Generalized idiopathic epilepsy and epileptic syndromes, not intractable, without status epilepticus: Secondary | ICD-10-CM

## 2020-07-31 DIAGNOSIS — G40209 Localization-related (focal) (partial) symptomatic epilepsy and epileptic syndromes with complex partial seizures, not intractable, without status epilepticus: Secondary | ICD-10-CM

## 2020-07-31 MED ORDER — DEPAKOTE SPRINKLES 125 MG PO CSDR: DELAYED_RELEASE_CAPSULE | ORAL | 1 refills | Status: DC

## 2020-07-31 NOTE — Telephone Encounter (Signed)
Please send to the pharmacy °

## 2020-09-15 ENCOUNTER — Telehealth (INDEPENDENT_AMBULATORY_CARE_PROVIDER_SITE_OTHER): Payer: Self-pay | Admitting: Family

## 2020-09-15 NOTE — Telephone Encounter (Signed)
I left a message for Mom and invited her to call back. TG 

## 2020-09-15 NOTE — Telephone Encounter (Signed)
  Who's calling (name and relationship to patient) : [name] (Mom)  Best contact number:[phone]  Provider they see: Elveria Rising  Reason for call: Received A VM that said exactly I need a call back at this number. I have no idea for what and I am only assuming it was mom that called it was a woman but left no name so I wanted to make sure I documented the encounter in case she calls back. The VM only had a phone number which I traced to this patient     PRESCRIPTION REFILL ONLY  Name of prescription:  Pharmacy:

## 2020-09-20 ENCOUNTER — Ambulatory Visit (INDEPENDENT_AMBULATORY_CARE_PROVIDER_SITE_OTHER): Payer: Medicare Other | Admitting: Family

## 2020-09-20 ENCOUNTER — Encounter (INDEPENDENT_AMBULATORY_CARE_PROVIDER_SITE_OTHER): Payer: Self-pay | Admitting: Family

## 2020-09-20 ENCOUNTER — Other Ambulatory Visit: Payer: Self-pay

## 2020-09-20 VITALS — BP 106/68 | HR 80 | Wt 77.2 lb

## 2020-09-20 DIAGNOSIS — R634 Abnormal weight loss: Secondary | ICD-10-CM

## 2020-09-20 DIAGNOSIS — R451 Restlessness and agitation: Secondary | ICD-10-CM

## 2020-09-20 DIAGNOSIS — G808 Other cerebral palsy: Secondary | ICD-10-CM | POA: Diagnosis not present

## 2020-09-20 DIAGNOSIS — R4689 Other symptoms and signs involving appearance and behavior: Secondary | ICD-10-CM

## 2020-09-20 DIAGNOSIS — G40209 Localization-related (focal) (partial) symptomatic epilepsy and epileptic syndromes with complex partial seizures, not intractable, without status epilepticus: Secondary | ICD-10-CM

## 2020-09-20 DIAGNOSIS — G40309 Generalized idiopathic epilepsy and epileptic syndromes, not intractable, without status epilepticus: Secondary | ICD-10-CM

## 2020-09-20 DIAGNOSIS — F71 Moderate intellectual disabilities: Secondary | ICD-10-CM | POA: Diagnosis not present

## 2020-09-20 MED ORDER — DEPAKOTE SPRINKLES 125 MG PO CSDR: DELAYED_RELEASE_CAPSULE | ORAL | 5 refills | Status: DC

## 2020-09-20 NOTE — Patient Instructions (Addendum)
Thank you for coming in today.   Instructions for you until your next appointment are as follows: 1. Continue giving Theodore Lewis's medications as you have been doing 2. Let me know if his seizures become more frequent or more severe 3. I will refer Theodore Lewis to the dietician for help with getting more calories into him. I am concerned about his ongoing weight loss 4. I will refer Theodore Lewis to Dr Huntley Dec in this office to help with his behavior 5. Please sign up for MyChart if you have not done so 6. Please plan to return for follow up in 6 months or sooner if needed.

## 2020-09-20 NOTE — Progress Notes (Signed)
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 This was treated with VP shunt. He has resultant spastic quadriparesis, contractures, poor vision, evidence of an axial collection of mixed type in the right frontal-temporal-parietal region and possible infarction of the right brain with encephalomalacia of the left brain. He is taking and tolerating Depakote and Carbatrolfor his seizure disorder. He is cared for at home by his parents, but his father has significant health problems that is limiting his ability to provide physical care.   Today's concerns: Theodore Lewis's parents report today that he has occasional brief seizures that do not require intervention. He has intermittent agitated behavior and Mom feels that he is more oppositional with care, such as changing his diaper. He is a picky eater and sometimes will only consume chicken nuggets. He has lost weight over time and is quite thin.   Theodore Lewis has been otherwise generally healthy since he was last seen. His parents have other health concerns for him today other than previously mentioned.  Review of systems: Please see HPI for neurologic and other pertinent review of systems. Otherwise all other systems were reviewed and were negative.  Problem List: Patient Active Problem List   Diagnosis Date Noted  . Restlessness and agitation 04/20/2015  . Hamstring tightness of both lower extremities 04/29/2014  . Moderate intellectual disabilities 02/24/2013  . History of congenital brain  abnormality 02/24/2013  . Obstructive hydrocephalus (HCC) 02/24/2013  . Congenital quadriplegia (HCC) 02/24/2013  . Generalized convulsive epilepsy (HCC) 02/24/2013  . Partial epilepsy with impairment of consciousness (HCC) 02/24/2013     Past Medical History:  Diagnosis Date  . Anxiety   . Cerebral palsy (HCC)   . Congenital quadriparesis (HCC)   . Neuromuscular disorder (HCC)   . Seizure (HCC)   . Seizures (HCC)    last seizure 3 yrs ago    Past medical history comments: See HPI Copied from previous record: Copied from previous record: He presented at few months of age with massive hydrocephalus. He required urgent placement of a ventriculoperitoneal shunt. He has experienced revisions in the past. Brain images in 2007 showed an extra-axial collection of mixed type in the right frontotemporal parietal region with possible infarction in the right brain and significant encephalomalacia of the left brain. Ventricles were well decompressed.  A CT scan of the brain and shunt series Mar 04, 2013 showed microcephaly with marked diffuse thickening of the calvarium and marked enlargement of the mastoid sinus bilaterally. Left frontal shunt catheter extends to the region of the third ventricle and is unchanged. No hydrocephalus. There is cerebellar atrophy bilaterally. 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 Brainstem is small. There is atrophy of the left occipital lobe. Agenesis of the corpus callosum.  Large extra-axial fluid collection on the right measures 4.6 x 11.1 cm. This has low density centrally and the wall has calcified since the prior study. The fluid collection has matured and become better defined since the prior study. There is some mass effect on the right cerebral hemisphere. No midline shift to the left. No  acute hemorrhage. Shunt tubing was intact from the ventricles to the abdomen.  Surgical history: Past Surgical History:  Procedure  Laterality Date  . BRAIN SURGERY     Shunt placed when he was a yr. old  . I & D EXTREMITY Bilateral 04/29/2014   Procedure: IRRIGATION AND DEBRIDEMENT EXTREMITY;  Surgeon: Eldred Manges, MD;  Location: [facility] OR;  Service: Orthopedics;  Laterality: Bilateral;  Right and Left Hamstring Lenghening and Fiberglass Casting  . LEG TENDON SURGERY Bilateral approx 6 yrs ago    Family history: family history includes Healthy in his mother; Stomach cancer in his paternal grandfather and paternal grandmother; Throat cancer in his paternal grandfather.   Social history: Social History   Socioeconomic History  . Marital status: Single    Spouse name: Not on file  . Number of children: Not on file  . Years of education: Not on file  . Highest education level: Not on file  Occupational History  . Not on file  Tobacco Use  . Smoking status: Never Smoker  . Smokeless tobacco: Never Used  Substance and Sexual Activity  . Alcohol use: No  . Drug use: No  . Sexual activity: Never  Other Topics Concern  . Not on file  Social History Narrative  . Not on file   Social Determinants of Health   Financial Resource Strain:   . Difficulty of Paying Living Expenses: Not on file  Food Insecurity:   . Worried About Programme researcher, broadcasting/film/video in the Last Year: Not on file  . Ran Out of Food in the Last Year: Not on file  Transportation Needs:   . Lack of Transportation (Medical): Not on file  . Lack of Transportation (Non-Medical): Not on file  Physical Activity:   . Days of Exercise per Week: Not on file  . Minutes of Exercise per Session: Not on file  Stress:   . Feeling of Stress : Not on file  Social Connections:   . Frequency of Communication with Friends and Family: Not on file  . Frequency of Social Gatherings with Friends and Family: Not on file  . Attends Religious Services: Not on file  . Active Member of Clubs or Organizations: Not on file  . Attends Banker Meetings: Not on file  .  Marital Status: Not on file  Intimate Partner Violence:   . Fear of Current or Ex-Partner: Not on file  . Emotionally Abused: Not on file  . Physically Abused: Not on file  . Sexually Abused: Not on file    Past/failed meds:  Allergies: Allergies  Allergen Reactions  . Vancomycin Rash      Immunizations: Immunization History  Administered Date(s) Administered  . PFIZER SARS-COV-2 Vaccination 04/04/2020    Diagnostics/Screenings: 05/23/2017 - CT head wo contrast -No acute finding. Chronic microcephaly and brain atrophy. Chronic calcified subdural collection on the right, not significantly changed since 03/04/2013. No evidence of shunt malfunction. Sinuses clear  Physical Exam: BP 106/68   Pulse 80   Wt 77 lb 3.2 oz (35 kg)   BMI 18.61 kg/m   Wt Readings from Last 3 Encounters:  09/20/20 77 lb 3.2 oz (35 kg)  03/20/20 75 lb (34 kg)  09/20/19 71 lb 6.4 oz (32.4 kg)  General: thin but well developed young man, seated in wheelchair, in no evident distress; black hair, brown eyes, right handed Head: normocephalic for his size and atraumatic. Oropharynx difficult to examine due to his inability to cooperate but  appears benign. No dysmorphic features. Neck: supple Cardiovascular: regular rate and rhythm, no murmurs. Respiratory: clear to auscultation bilaterally Abdomen: bowel sounds present all four quadrants, abdomen soft, non-tender, non-distended. No hepatosplenomegaly or masses palpated. Musculoskeletal: thin extremities with significant muscle wasting. Has contractures and increased tone at the hips, knees, ankles and elbows. His right hip protrudes more than the left.  Skin: no rashes or neurocutaneous lesions  Neurologic Exam Mental Status: awake and fully alert. Has no language. Signed several times that he wanted to eat. Variable eye contact. Did not smile responsively. Resistant to invasions into his space. Cranial Nerves: fundoscopic exam - red reflex present.   Unable to fully visualize fundus.  Pupils equal briskly reactive to light.  Turns to localize faces and objects in the periphery. Turns to localize sounds in the periphery. Facial movements are asymmetric, has lower facial weakness with drooling.  Motor: spastic quadriparesis with increased tone in the lower extremities. Clumsy grasp and clumsy purposeful movements. Sensory: withdrawal x 4 Coordination: unable to adequately assess due to patient's inability to participate in examination. No dysmetria when reaching for objects. Gait and Station: unable to independently stand and bear weight.  Reflexes: unable to adequately assess due to patient's inability to participate in examination  Impression: 1. Localization related epilepsy with complex partial seizures 2. Generalized convulsive epilepsy 3. Acquired obstructive hydrocephalus with functioning VP shunt 4. Spastic quadriparesis with sparing of the right arm  5. Moderate intellectual disability 6. Episodic agitated behavior 7. Insomnia 8. Oppositional behavior with care 9. Weight loss  Recommendations for plan of care: [facility] Theodore Lewis has neither had nor required imaging or lab studies since the last visit. He is a 24 year old man with history of post hemorrhagic hydrocephalus requiring VP shunt, spastic quadriparesis, epilepsy and moderate intellectual disability. He has experienced weight loss and I talked with his parents about that. I will refer Theodore Lewis to the dietician for help with increasing his calories. He is oppositional with care and I will refer him to Dr Huntley Decupito for his behavior and for help with parenting. I will see Theodore Lewis back in follow up in 6 months or sooner if needed. His parents agreed with the plans made today.   The medication list was reviewed and reconciled. No changes were made in the prescribed medications today. A complete medication list was provided to the patient.  Orders  Placed This Encounter  Procedures  . [facility]    Referral Priority:   Routine    Referral Type:   Consultation    Referral Reason:   Specialty Services Required    Requested Specialty:   Pediatrics    Number of Visits Requested:   1  . Ambulatory referral to Integrated Behavioral Health    Referral Priority:   Routine    Referral Type:   Consultation    Referral Reason:   Specialty Services Required    Number of Visits Requested:   1     Allergies as of 09/20/2020      Reactions   Vancomycin Rash      Medication List       Accurate as of September 20, 2020 11:59 PM. If you have any questions, ask your nurse or doctor.        Carbatrol 300 MG 12 hr capsule Generic drug: carbamazepine Take 1 capsule (300 mg total) by mouth 2 (two) times daily.   cetirizine 10 MG tablet  Commonly known as: ZYRTEC Take 10 mg by mouth daily.   Depakote Sprinkles 125 MG capsule Generic drug: divalproex TAKE 5 CAPSULE BY MOUTH TWICE DAILY   diazepam 1 MG/ML solution Commonly known as: VALIUM Give 1 ml at bedtime and give 81ml by mouth up to twice per day as needed for restlessness   diazepam 10 MG Gel Commonly known as: DIASTAT ACUDIAL INSERT 10 MG RECTALLY AT ONSET OF SEIZURE   ibuprofen 100 MG/5ML suspension Commonly known as: ADVIL Take 20 mLs (400 mg total) by mouth every 4 (four) hours as needed.      Return in about 6 months (around 03/20/2021).  Total time spent with the patient was 30 minutes, of which 50% or more was spent in counseling and coordination of care.  Elveria Rising NP-C [facility] Ph. [phone] Fax [phone]

## 2020-09-22 ENCOUNTER — Encounter (INDEPENDENT_AMBULATORY_CARE_PROVIDER_SITE_OTHER): Payer: Self-pay | Admitting: Family

## 2020-09-22 DIAGNOSIS — R4689 Other symptoms and signs involving appearance and behavior: Secondary | ICD-10-CM | POA: Insufficient documentation

## 2020-09-22 DIAGNOSIS — R634 Abnormal weight loss: Secondary | ICD-10-CM | POA: Insufficient documentation

## 2020-10-04 NOTE — Progress Notes (Addendum)
   Medical Nutrition Therapy - Initial Assessment Appt start time: 2:00 PM Appt end time: 2:55 PM Reason for referral: Loss of weight Referring provider: Elveria Rising - Neuro DME: [facility] Pertinent medical hx: congenital quadriplegia, epilepsy, obstructive hydrocephalus, oppositional behavior, loss of weight  Assessment: Food allergies: none known Pertinent Medications: see medication list Vitamins/Supplements: none Pertinent labs: no recent labs in Epic  (12/2) Anthropometrics: Ht: 153.4 cm Wt: 35 kg BMI: 14.8  Estimated minimum caloric needs: 1700 kcal/day (CP non-ambulatory) Estimated minimum protein needs: 0.85 g/kg/day (DRI) Estimated minimum fluid needs: 1700 mL/kg/day (1 mL/kcal)  Primary concerns today: Consult given pt with weight loss. Mom and dad accompanied pt to appt today.  Dietary Intake Hx: Current feeding regimen: Family feeds pt whenever he is hungry. Pt lets parents know he is hungry by signing - can sign for food or drink. Mom reports pt will sign 5-6x/day. Preferred foods: chicken nuggets (food [employer] brand OR [employer] only), instant mashed potatoes, flavored oatmeal, pudding, applesauce, warm baked goods with glaze and french fries/shrimp at [employer]. Pt prefers all foods very hot and will not drink any cold foods. Pt will only drink diet soda. All foods are fed to pt and all liquids consumed via syringe. Parents report pt has been waking up ~3 AM every morning wanting food and drink. Hx of Pediasure but pt "got burnt out" on it and refused so parents stopped ordering it.  GI: unable to discuss GU: unable to discuss  Physical Activity: limited - wheelchair dependent  Estimated intake likely not meeting needs given parental report of poor weigh gain and some weight loss.  Nutrition Diagnosis: (10/06/2020) Inadequate oral intake related to limited diet as evidence by parental report and BMI of 14.8.  Intervention: Discussed current diet. Discussed  recommendations below. All questions answered, family in agreement with plan. Of note, family very talkative and difficult to keep on track so hx limited. Recommendations: Ways to increase calories: - Make oatmeal with whole milk and add butter. - Add butter or cream to his mashed potatoes. - Let him try different dips for his chicken nuggets. - You could try pureed pouches. - Always offer any food you are eating. - We'll start a multivitamin - I'll order a powder from [facility]. - Try the supplements and let me know what he likes. You can add chocolate or strawberry syrup to these to change the flavor.  Teach back method used.  Monitoring/Evaluation: Goals to Monitor: - Weight trends - PO intake  Follow-up in 3 months, joint with Inetta Fermo.  Total time spent in counseling: 55 minutes.   10/06/2020 Addendum: After discussing with Joni Reining, RD at [facility], unable to provide Nano[employer] via [facility]. RD to call mom and update. Liquid options: [employer], [employer], [address] liquid, [employer].

## 2020-10-05 ENCOUNTER — Ambulatory Visit (INDEPENDENT_AMBULATORY_CARE_PROVIDER_SITE_OTHER): Payer: Medicare Other | Admitting: Dietician

## 2020-10-05 ENCOUNTER — Other Ambulatory Visit: Payer: Self-pay

## 2020-10-05 VITALS — Ht 60.41 in | Wt 77.2 lb

## 2020-10-05 DIAGNOSIS — R634 Abnormal weight loss: Secondary | ICD-10-CM | POA: Diagnosis not present

## 2020-10-05 NOTE — Patient Instructions (Addendum)
Ways to increase calories: - Make oatmeal with whole milk and add butter. - Add butter or cream to his mashed potatoes. - Let him try different dips for his chicken nuggets.  - You could try pureed pouches. - Always offer any food you are eating.  - We'll start a multivitamin - I'll order a powder from [facility]. - Try the supplements and let me know what he likes. You can add chocolate or strawberry syrup to these to change the flavor.

## 2020-10-06 ENCOUNTER — Telehealth (INDEPENDENT_AMBULATORY_CARE_PROVIDER_SITE_OTHER): Payer: Self-pay | Admitting: Dietician

## 2020-10-06 NOTE — Telephone Encounter (Signed)
RD called mom to update on MVI. RD shared brand ideas and encouraged mom to order online as liquid MVIs can be hard to find in store. Liquid options: [employer], [employer], [address] liquid, [employer]. Instructed mom to follow package instructions for dosing. Mom very appreciative. All questions answered, mom in agreement with plan.

## 2020-10-12 ENCOUNTER — Institutional Professional Consult (permissible substitution) (INDEPENDENT_AMBULATORY_CARE_PROVIDER_SITE_OTHER): Payer: Medicaid Other | Admitting: Psychology

## 2020-11-07 ENCOUNTER — Encounter (INDEPENDENT_AMBULATORY_CARE_PROVIDER_SITE_OTHER): Payer: Self-pay

## 2020-11-15 DIAGNOSIS — R159 Full incontinence of feces: Secondary | ICD-10-CM | POA: Insufficient documentation

## 2020-11-27 ENCOUNTER — Telehealth (INDEPENDENT_AMBULATORY_CARE_PROVIDER_SITE_OTHER): Payer: Self-pay | Admitting: Family

## 2020-11-27 NOTE — Telephone Encounter (Signed)
  Who's calling (name and relationship to patient) : Michele Mcalpine - care coordinator with [facility] contact number: [phone]  Provider they see: Elveria Rising  Reason for call: Calling to check status of paperwork.    PRESCRIPTION REFILL ONLY  Name of prescription:  Pharmacy:

## 2020-11-27 NOTE — Telephone Encounter (Signed)
I received the document today. I called Phil to let him know that I will complete the form and send it back to him as soon as possible. TG

## 2020-11-29 NOTE — Telephone Encounter (Signed)
The form was faxed as requested. TG

## 2021-01-18 ENCOUNTER — Encounter (INDEPENDENT_AMBULATORY_CARE_PROVIDER_SITE_OTHER): Payer: Self-pay | Admitting: Family

## 2021-01-18 ENCOUNTER — Ambulatory Visit (INDEPENDENT_AMBULATORY_CARE_PROVIDER_SITE_OTHER): Payer: Medicare Other | Admitting: Dietician

## 2021-01-18 ENCOUNTER — Ambulatory Visit (INDEPENDENT_AMBULATORY_CARE_PROVIDER_SITE_OTHER): Payer: Medicare Other | Admitting: Family

## 2021-01-18 ENCOUNTER — Other Ambulatory Visit: Payer: Self-pay

## 2021-01-18 VITALS — BP 110/80 | HR 88 | Wt 103.8 lb

## 2021-01-18 DIAGNOSIS — G40309 Generalized idiopathic epilepsy and epileptic syndromes, not intractable, without status epilepticus: Secondary | ICD-10-CM | POA: Diagnosis not present

## 2021-01-18 DIAGNOSIS — R634 Abnormal weight loss: Secondary | ICD-10-CM

## 2021-01-18 DIAGNOSIS — R451 Restlessness and agitation: Secondary | ICD-10-CM | POA: Diagnosis not present

## 2021-01-18 DIAGNOSIS — G911 Obstructive hydrocephalus: Secondary | ICD-10-CM

## 2021-01-18 DIAGNOSIS — R4689 Other symptoms and signs involving appearance and behavior: Secondary | ICD-10-CM | POA: Diagnosis not present

## 2021-01-18 DIAGNOSIS — F71 Moderate intellectual disabilities: Secondary | ICD-10-CM | POA: Diagnosis not present

## 2021-01-18 DIAGNOSIS — G40209 Localization-related (focal) (partial) symptomatic epilepsy and epileptic syndromes with complex partial seizures, not intractable, without status epilepticus: Secondary | ICD-10-CM

## 2021-01-18 DIAGNOSIS — G808 Other cerebral palsy: Secondary | ICD-10-CM | POA: Diagnosis not present

## 2021-01-18 NOTE — Progress Notes (Signed)
Theodore Lewis   MRN:  [id number]  [birth year]   Provider: Elveria Rising NP-C Location of Care: [facility]  Visit type: Routine visit  Last visit: 09/20/2020  Referral source: Bernadette Hoit, MD History from: both parents. 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 This was treated with VP shunt. He has resultant spastic quadriparesis, contractures, poor vision, evidence of an axial collection of mixed type in the right frontal-temporal-parietal region and possible infarction of the right brain with encephalomalacia of the left brain. He is taking and tolerating Depakote and Carbatrolfor his seizure disorder. He is cared for at home by his parents, but his father has significant health problems that is limiting his ability to provide physical care  Today's concerns:  Parents report today that Theodore Lewis has remained seizure free since his last visit. He has a new wheelchair which fits him better but the wheels are more posterior, which limit his ability to manipulate the chair on his own.   When Theodore Lewis was last seen, he had lost weight and was referred to the dietician. He has gained weight since then and his parents are pleased with him eating more food. He will only drink small amounts of Pediasure but they work with him to get it in.   Dad asked if Theodore Lewis need to have CT scan to check his shunt. He denies any symptom of shunt failure or malfunction such as headache, vomiting or lethargy.  Theodore Lewis has been otherwise generally healthy since he was last seen. His parents have no other health concerns for him today other than previously mentioned.  Review of systems: Please see HPI for neurologic and other pertinent review of systems. Otherwise all other  systems were reviewed and were negative.  Problem List: Patient Active Problem List   Diagnosis Date Noted  . Loss of weight 09/22/2020  . Oppositional behavior 09/22/2020  . Restlessness and agitation 04/20/2015  . Hamstring tightness of both lower extremities 04/29/2014  . Moderate intellectual disabilities 02/24/2013  . History of congenital brain abnormality 02/24/2013  . Obstructive hydrocephalus (HCC) 02/24/2013  . Congenital quadriplegia (HCC) 02/24/2013  . Generalized convulsive epilepsy (HCC) 02/24/2013  . Partial epilepsy with impairment of consciousness (HCC) 02/24/2013     Past Medical History:  Diagnosis Date  . Anxiety   . Cerebral palsy (HCC)   . Congenital quadriparesis (HCC)   . Neuromuscular disorder (HCC)   . Seizure (HCC)   . Seizures (HCC)    last seizure 3 yrs ago    Past medical history comments: See HPI Copied from previous record: He presented at few months of age with massive hydrocephalus. He required urgent placement of a ventriculoperitoneal shunt. He has experienced revisions in the past. Brain images in 2007 showed an extra-axial collection of mixed type in the right frontotemporal parietal region with possible infarction in the right brain and significant encephalomalacia of the left brain. Ventricles were well decompressed.  A CT scan of the brain and shunt series Mar 04, 2013 showed microcephaly with marked diffuse thickening of the calvarium and marked enlargement of the mastoid sinus bilaterally. Left frontal shunt catheter extends to the region of the third ventricle and is unchanged. No hydrocephalus. There is cerebellar atrophy bilaterally. 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 Brainstem is  small. There is atrophy of the left occipital lobe. Agenesis of the corpus callosum.  Large extra-axial fluid collection on the right measures 4.6 x 11.1 cm. This has low density centrally and the wall has calcified  since the prior study. The fluid collection has matured and become better defined since the prior study. There is some mass effect on the right cerebral hemisphere. No midline shift to the left. No acute hemorrhage.Shunt tubing was intact from the ventricles to the abdomen.  Surgical history: Past Surgical History:  Procedure Laterality Date  . BRAIN SURGERY     Shunt placed when he was a yr. old  . I & D EXTREMITY Bilateral 04/29/2014   Procedure: IRRIGATION AND DEBRIDEMENT EXTREMITY;  Surgeon: Eldred Manges, MD;  Location: [facility] OR;  Service: Orthopedics;  Laterality: Bilateral;  Right and Left Hamstring Lenghening and Fiberglass Casting  . LEG TENDON SURGERY Bilateral approx 6 yrs ago    Family history: family history includes Healthy in his mother; Stomach cancer in his paternal grandfather and paternal grandmother; Throat cancer in his paternal grandfather.   Social history: Social History   Socioeconomic History  . Marital status: Single    Spouse name: Not on file  . Number of children: Not on file  . Years of education: Not on file  . Highest education level: Not on file  Occupational History  . Not on file  Tobacco Use  . Smoking status: Never Smoker  . Smokeless tobacco: Never Used  Substance and Sexual Activity  . Alcohol use: No  . Drug use: No  . Sexual activity: Never  Other Topics Concern  . Not on file  Social History Narrative  . Not on file   Social Determinants of Health   Financial Resource Strain: Not on file  Food Insecurity: Not on file  Transportation Needs: Not on file  Physical Activity: Not on file  Stress: Not on file  Social Connections: Not on file  Intimate Partner Violence: Not on file    Past/failed meds:  Allergies: Allergies  Allergen Reactions  . Vancomycin Rash    Immunizations: Immunization History  Administered Date(s) Administered  . PFIZER(Purple Top)SARS-COV-2 Vaccination 04/04/2020    Diagnostics/Screenings: Copied  from previous record: 05/23/2017 - CT head wo contrast -No acute finding. Chronic microcephaly and brain atrophy. Chronic calcified subdural collection on the right, not significantlychanged since 03/04/2013. No evidence of shunt malfunction. Sinuses clear  Physical Exam: BP 110/80   Pulse 88   Wt 103 lb 12.8 oz (47.1 kg)   BMI 20.00 kg/m   Wt Readings from Last 3 Encounters:  01/18/21 103 lb 12.8 oz (47.1 kg)  01/18/21 103 lb 12.8 oz (47.1 kg)  10/05/20 77 lb 3.2 oz (35 kg)   General: thin but otherwise well developed young man, seated in wheelchair, in no evident distress; black hair, brown eyes, right handed Head: normocephalic and atraumatic. Oropharynx difficult to examine due to his inability to cooperate but appears benign. No dysmorphic features. Neck: supple Cardiovascular: regular rate and rhythm, no murmurs. Respiratory: clear to auscultation bilaterally Abdomen: bowel sounds present all four quadrants, abdomen soft, non-tender, non-distended.  Musculoskeletal: thin extremities with significant muscle wasting. Has contractures and increased tone at the hips, knees, ankles and elbows. His right hip protrudes more than the left.  Skin: no rashes or neurocutaneous lesions  Neurologic Exam Mental Status: awake and fully alert. Has no language. Signed to his mother once that he wanted to eat. Variable eye contact. Impassive  face. Resistant to invasions into his space Cranial Nerves: fundoscopic exam - red reflex present.  Unable to fully visualize fundus.  Pupils equal briskly reactive to light.  Turns to localize faces and objects in the periphery. Turns to localize sounds in the periphery. Facial movements are asymmetric, has lower facial weakness with drooling.  Motor: spastic quadriparesis with increased tone in the lower extremities. Clumsy grasp and clumsy purposeful movements.  Sensory: withdrawal x 4 Coordination: unable to adequately assess due to patient's inability to  participate in examination. No dysmetria when reaching for objects. Gait and Station: unable to stand and bear weight.  Reflexes: unable to adequately assess due to patient's inability to cooperate with examination  Impression: 1. Localization related epilepsy with complex partial seizures 2. Generalized convulsive epilepsy 3. Acquired obstructive hydrocephalus with functioning VP shunt 4. Spastic quadriparesis with sparing of the right arm 5. Moderate intellectual disability 6. Episodic agitated behavior 7. Insomnia 8. Oppositional behavior with care 9. Weight loss  Recommendations for plan of care: The patient's previous [facility] records were reviewed. Kyser has neither had nor required imaging or lab studies since the last visit. He is a 25 year old young man with history of acquired obstructive hydrocephalus with functioning VP shunt, localization related epilepsy with complex partial seizures and generalized convulsive seizures, spastic quadriparesis with sparing of the right arm, moderate intellectual disability, episodic agitated behavior, insomnia, oppositional behavior with care and weight loss. He is taking and tolerating Carbatrol and Depakote and has remained seizure free since his last visit. Devlon was referred to the dietician for weight loss in November and he has gained weight since then. I encouraged his parents to continue to follow the dietician recommendations. He has a new wheelchair but has trouble manipulating it because of the wheel placement. I recommended that his parents contact [employer] about that. Dad had questions about checking his shunt and I talked with him about that. As Esten has no symptoms of shunt failure or malfunction, there is no indication to do so at this time. Finally, I asked his parents to let me know if he has seizures. Noell's parents agreed with the plans made today. I will otherwise see him back in follow up in 4 months or sooner if needed.   The  medication list was reviewed and reconciled. No changes were made in the prescribed medications today. A complete medication list was provided to the patient.  Allergies as of 01/18/2021      Reactions   Vancomycin Rash      Medication List       Accurate as of January 18, 2021  2:37 PM. If you have any questions, ask your nurse or doctor.        Carbatrol 300 MG 12 hr capsule Generic drug: carbamazepine Take 1 capsule (300 mg total) by mouth 2 (two) times daily.   cetirizine 10 MG tablet Commonly known as: ZYRTEC Take 10 mg by mouth daily.   Depakote Sprinkles 125 MG capsule Generic drug: divalproex TAKE 5 CAPSULE BY MOUTH TWICE DAILY   diazepam 1 MG/ML solution Commonly known as: VALIUM Give 1 ml at bedtime and give 56ml by mouth up to twice per day as needed for restlessness   diazepam 10 MG Gel Commonly known as: DIASTAT ACUDIAL INSERT 10 MG RECTALLY AT ONSET OF SEIZURE   ibuprofen 100 MG/5ML suspension Commonly known as: ADVIL Take 20 mLs (400 mg total) by mouth every 4 (four) hours as needed.  Total time spent with the patient was 20 `minutes, of which 50% or more was spent in counseling and coordination of care.  Elveria Risingina Ayva Veilleux NP-C [facility] [phone] Fax [phone]

## 2021-01-18 NOTE — Patient Instructions (Addendum)
-   Start a liquid multivitamin - Theodore Lewis, [address], [address] liquid, [place] Alive. - You can also crush up a Flintstone's chewable.

## 2021-01-18 NOTE — Progress Notes (Signed)
   Medical Nutrition Therapy - Progress Note Appt start time: 2:55 PM Appt end time: 3:15 PM Reason for referral: Loss of weight Referring provider: Elveria Rising - Neuro DME: [facility] Pertinent medical hx: congenital quadriplegia, epilepsy, obstructive hydrocephalus, oppositional behavior, loss of weight  Assessment: Food allergies: none known Pertinent Medications: see medication list Vitamins/Supplements: none Pertinent labs: no recent labs in Epic  (3/17) Anthropometrics: Ht: 153.4 cm Wt: 47.1 kg BMI: 20  (12/2) Anthropometrics: Ht: 153.4 cm Wt: 35 kg BMI: 14.8  Estimated minimum caloric needs: 1700 kcal/day (CP non-ambulatory) Estimated minimum protein needs: 0.85 g/kg/day (DRI) Estimated minimum fluid needs: 1700 mL/kg/day (1 mL/kcal)  Primary concerns today: Follow up for weight loss. Mom and dad accompanied pt to appt today.  Dietary Intake Hx: Current feeding regimen: Family feeds pt whenever he is hungry. Pt lets parents know he is hungry by signing - can sign for food or drink. Mom reports pt will sign 5-6x/day. Preferred foods: chicken nuggets (food [employer] brand OR [employer] only), instant mashed potatoes, flavored oatmeal (family now adding cream), pudding, applesauce, cookies, cheese crackers, french fries, warm baked goods with glaze and french fries/shrimp at [employer]. Pt prefers all foods very hot and will not drink any cold foods. Pt will only drink diet soda - mom now mixing half water using a straw. All foods are fed to pt and all liquids consumed via syringe. Parents report pt has been waking up ~3 AM every morning wanting food and drink. Hx of Pediasure but pt "got burnt out" on it and refused so parents stopped ordering it - parents have restarted ~1/2 can of Ensure daily.  GI: "great" - daily GU: no concerns  Physical Activity: limited - wheelchair dependent  Estimated intake likely meeting needs given weight gain.  Nutrition  Diagnosis: (10/06/2020) Inadequate oral intake related to limited diet as evidence by parental report and BMI of 14.8.  Intervention: Discussed current diet and changes. Parent's report adding calories as able. Parents unsure if weight gain is accurate, but do feel that pt has gained a significant amount of weight since last appt. Discussed MVI and recommendations below. All questions answered, parents in agreement with plan. Recommendations: - Start a liquid multivitamin - Grayland Ormond, [address], [address] liquid, [place]. - You can also crush up a Flintstone's chewable.  Teach back method used.  Monitoring/Evaluation: Goals to Monitor: - Weight trends - PO intake  Follow-up in 6 months with nutrition.  Total time spent in counseling: 20 minutes.

## 2021-01-19 ENCOUNTER — Encounter (INDEPENDENT_AMBULATORY_CARE_PROVIDER_SITE_OTHER): Payer: Self-pay | Admitting: Family

## 2021-01-19 NOTE — Patient Instructions (Signed)
Thank you for coming in today.   Instructions for you until your next appointment are as follows: 1. Continue giving Theodore Lewis's medications as prescribed 2. Let me know if Theodore Lewis has seizures 3. Continue working with Theodore Lewis to continue to maintain his weight 4. Please sign up for MyChart if you have not done so. 5. Please plan to return for follow up in 4 months or sooner if needed.

## 2021-01-26 ENCOUNTER — Other Ambulatory Visit (INDEPENDENT_AMBULATORY_CARE_PROVIDER_SITE_OTHER): Payer: Self-pay | Admitting: Family

## 2021-01-26 DIAGNOSIS — G40309 Generalized idiopathic epilepsy and epileptic syndromes, not intractable, without status epilepticus: Secondary | ICD-10-CM

## 2021-01-26 DIAGNOSIS — G40209 Localization-related (focal) (partial) symptomatic epilepsy and epileptic syndromes with complex partial seizures, not intractable, without status epilepticus: Secondary | ICD-10-CM

## 2021-01-29 ENCOUNTER — Other Ambulatory Visit (INDEPENDENT_AMBULATORY_CARE_PROVIDER_SITE_OTHER): Payer: Self-pay | Admitting: Family

## 2021-01-29 DIAGNOSIS — G40209 Localization-related (focal) (partial) symptomatic epilepsy and epileptic syndromes with complex partial seizures, not intractable, without status epilepticus: Secondary | ICD-10-CM

## 2021-01-29 DIAGNOSIS — G40309 Generalized idiopathic epilepsy and epileptic syndromes, not intractable, without status epilepticus: Secondary | ICD-10-CM

## 2021-01-29 MED ORDER — CARBATROL 300 MG PO CP12: 300.0000 mg | ORAL_CAPSULE | Freq: Two times a day (BID) | ORAL | 5 refills | Status: DC

## 2021-01-29 MED ORDER — DEPAKOTE SPRINKLES 125 MG PO CSDR
DELAYED_RELEASE_CAPSULE | ORAL | 5 refills | Status: DC
Start: 2021-01-29 — End: 2021-08-07

## 2021-01-31 ENCOUNTER — Other Ambulatory Visit (INDEPENDENT_AMBULATORY_CARE_PROVIDER_SITE_OTHER): Payer: Self-pay | Admitting: Family

## 2021-01-31 DIAGNOSIS — G40209 Localization-related (focal) (partial) symptomatic epilepsy and epileptic syndromes with complex partial seizures, not intractable, without status epilepticus: Secondary | ICD-10-CM

## 2021-01-31 DIAGNOSIS — G40309 Generalized idiopathic epilepsy and epileptic syndromes, not intractable, without status epilepticus: Secondary | ICD-10-CM

## 2021-01-31 NOTE — Telephone Encounter (Signed)
Please escribe

## 2021-02-13 ENCOUNTER — Encounter (INDEPENDENT_AMBULATORY_CARE_PROVIDER_SITE_OTHER): Payer: Self-pay | Admitting: Dietician

## 2021-02-15 ENCOUNTER — Telehealth (INDEPENDENT_AMBULATORY_CARE_PROVIDER_SITE_OTHER): Payer: Self-pay | Admitting: Family

## 2021-02-15 NOTE — Telephone Encounter (Signed)
  Who's calling (name and relationship to patient) : [name] ( grandmother )  Best contact number: [phone]  Provider they see: Elveria Rising   Reason for call: worried about patients head and would like to speak to [facility]      PRESCRIPTION REFILL ONLY  Name of prescription:  Pharmacy:

## 2021-02-15 NOTE — Telephone Encounter (Signed)
I called and spoke with Theodore Lewis. She said that for the last week or so, Diallo has a vein on his forehead that is more prominent than usual. She says that sometimes it looks bigger and sometimes looks more normal, but since it is staying bigger that she worries that something is wrong. She said that Isaia's appetite is good, he is sleeping well, his behavior is normal for him and that he seems generally well. I attempted to reassure Mom that prominent forehead veins are common and typically are no cause for alarm. I encouraged her to call back if she has questions or concerns. TG

## 2021-03-07 DIAGNOSIS — R630 Anorexia: Secondary | ICD-10-CM | POA: Diagnosis not present

## 2021-03-07 DIAGNOSIS — J029 Acute pharyngitis, unspecified: Secondary | ICD-10-CM | POA: Diagnosis not present

## 2021-03-11 ENCOUNTER — Encounter (INDEPENDENT_AMBULATORY_CARE_PROVIDER_SITE_OTHER): Payer: Self-pay

## 2021-03-12 ENCOUNTER — Other Ambulatory Visit: Payer: Self-pay

## 2021-03-12 ENCOUNTER — Emergency Department (HOSPITAL_COMMUNITY)
Admission: EM | Admit: 2021-03-12 | Discharge: 2021-03-12 | Disposition: A | Discharge status: Home / Self Care | Payer: Medicare Other | Attending: Emergency Medicine | Admitting: Emergency Medicine

## 2021-03-12 ENCOUNTER — Emergency Department (HOSPITAL_COMMUNITY): Payer: Medicare Other

## 2021-03-12 ENCOUNTER — Encounter (HOSPITAL_COMMUNITY): Payer: Self-pay

## 2021-03-12 DIAGNOSIS — T8509XA Other mechanical complication of ventricular intracranial (communicating) shunt, initial encounter: Secondary | ICD-10-CM | POA: Insufficient documentation

## 2021-03-12 DIAGNOSIS — T85618A Breakdown (mechanical) of other specified internal prosthetic devices, implants and grafts, initial encounter: Secondary | ICD-10-CM

## 2021-03-12 DIAGNOSIS — R059 Cough, unspecified: Secondary | ICD-10-CM | POA: Diagnosis not present

## 2021-03-12 DIAGNOSIS — M852 Hyperostosis of skull: Secondary | ICD-10-CM | POA: Diagnosis not present

## 2021-03-12 DIAGNOSIS — R633 Feeding difficulties, unspecified: Secondary | ICD-10-CM | POA: Diagnosis present

## 2021-03-12 DIAGNOSIS — G939 Disorder of brain, unspecified: Secondary | ICD-10-CM | POA: Diagnosis not present

## 2021-03-12 DIAGNOSIS — Z982 Presence of cerebrospinal fluid drainage device: Secondary | ICD-10-CM | POA: Diagnosis not present

## 2021-03-12 DIAGNOSIS — G809 Cerebral palsy, unspecified: Secondary | ICD-10-CM | POA: Diagnosis not present

## 2021-03-12 LAB — URINALYSIS, ROUTINE W REFLEX MICROSCOPIC
Bilirubin Urine: NEGATIVE
Glucose, UA: NEGATIVE mg/dL
Ketones, ur: 5 mg/dL — AB
Leukocytes,Ua: NEGATIVE
Nitrite: NEGATIVE
Protein, ur: NEGATIVE mg/dL
Specific Gravity, Urine: 1.026 (ref 1.005–1.030)
pH: 5 (ref 5.0–8.0)

## 2021-03-12 LAB — CBC WITH DIFFERENTIAL/PLATELET
Abs Immature Granulocytes: 0.02 10*3/uL (ref 0.00–0.07)
Basophils Absolute: 0 10*3/uL (ref 0.0–0.1)
Basophils Relative: 0 %
Eosinophils Absolute: 0.1 10*3/uL (ref 0.0–0.5)
Eosinophils Relative: 2 %
HCT: 49.1 % (ref 39.0–52.0)
Hemoglobin: 16.1 g/dL (ref 13.0–17.0)
Immature Granulocytes: 0 %
Lymphocytes Relative: 25 %
Lymphs Abs: 1.2 10*3/uL (ref 0.7–4.0)
MCH: 31.9 pg (ref 26.0–34.0)
MCHC: 32.8 g/dL (ref 30.0–36.0)
MCV: 97.2 fL (ref 80.0–100.0)
Monocytes Absolute: 0.7 10*3/uL (ref 0.1–1.0)
Monocytes Relative: 14 %
Neutro Abs: 2.9 10*3/uL (ref 1.7–7.7)
Neutrophils Relative %: 59 %
Platelets: 128 10*3/uL — ABNORMAL LOW (ref 150–400)
RBC: 5.05 MIL/uL (ref 4.22–5.81)
RDW: 13.8 % (ref 11.5–15.5)
WBC: 4.9 10*3/uL (ref 4.0–10.5)
nRBC: 0 % (ref 0.0–0.2)

## 2021-03-12 LAB — COMPREHENSIVE METABOLIC PANEL
ALT: 28 U/L (ref 0–44)
AST: 28 U/L (ref 15–41)
Albumin: 3.9 g/dL (ref 3.5–5.0)
Alkaline Phosphatase: 88 U/L (ref 38–126)
Anion gap: 7 (ref 5–15)
BUN: 15 mg/dL (ref 6–20)
CO2: 28 mmol/L (ref 22–32)
Calcium: 9.9 mg/dL (ref 8.9–10.3)
Chloride: 103 mmol/L (ref 98–111)
Creatinine, Ser: 0.72 mg/dL (ref 0.61–1.24)
GFR, Estimated: >60 mL/min (ref >60)
Glucose, Bld: 94 mg/dL (ref 70–99)
Potassium: 4.8 mmol/L (ref 3.5–5.1)
Sodium: 138 mmol/L (ref 135–145)
Total Bilirubin: 0.2 mg/dL — ABNORMAL LOW (ref 0.3–1.2)
Total Protein: 7.6 g/dL (ref 6.5–8.1)

## 2021-03-12 LAB — CARBAMAZEPINE LEVEL, TOTAL: Carbamazepine Lvl: 2.2 ug/mL — ABNORMAL LOW (ref 4.0–12.0)

## 2021-03-12 LAB — VALPROIC ACID LEVEL: Valproic Acid Lvl: 26 ug/mL — ABNORMAL LOW (ref 50.0–100.0)

## 2021-03-12 LAB — AMMONIA: Ammonia: 33 umol/L (ref 9–35)

## 2021-03-12 MED ORDER — LORAZEPAM 2 MG/ML IJ SOLN
0.5000 mg | Freq: Once | INTRAMUSCULAR | Status: AC
  Administered 2021-03-12: 0.5 mg via INTRAVENOUS
  Filled 2021-03-12: qty 1

## 2021-03-12 MED ORDER — MIDAZOLAM HCL (PF) 5 MG/ML IJ SOLN: 2.0000 mg | Freq: Once | INTRAMUSCULAR | Status: DC | PRN

## 2021-03-12 MED ORDER — MIDAZOLAM HCL 2 MG/2ML IJ SOLN
1.0000 mg | Freq: Once | INTRAMUSCULAR | Status: DC | PRN
  Filled 2021-03-12: qty 2

## 2021-03-12 MED ORDER — MIDAZOLAM HCL (PF) 5 MG/ML IJ SOLN: 1.0000 mg | Freq: Once | INTRAMUSCULAR | Status: DC | PRN

## 2021-03-12 NOTE — ED Notes (Signed)
Pt appears calmer, notified CT techs to attempt CT @ approx 10am

## 2021-03-12 NOTE — ED Triage Notes (Signed)
Pt mother requesting wellness check after being evaluated by online provider. Pt seen for throat issue and dark stools. Mother reports these sx have been ongoing for 3-4 months. Pt will cough like he is unable to clear throat. Also c/o dark stools; denies diarrhea or constipation. Says she has only added chocolate pudding to diet. Given rx for penicillin and famotidine.

## 2021-03-12 NOTE — ED Notes (Signed)
With 2 CT techs, this RN and mother at bedside on CT table, pt fighting and unable to lie still for CT scan. Notified MD Messick.

## 2021-03-12 NOTE — ED Notes (Signed)
Patient parent refused rectal occult blood test.

## 2021-03-12 NOTE — ED Provider Notes (Signed)
Patient seen after prior ED provider.  CT Brain imaging obtained and is without acute change.  Peds Neuro team is aware of shunt discontinuity.  Neurosurgery [facility]) aware of shunt discontinuity and agree with planned outpatient FU.  Family at bedside understand need for close FU.    Wynetta Fines, MD 03/12/21 1220

## 2021-03-12 NOTE — ED Provider Notes (Signed)
[facility]-EMERGENCY DEPT Provider Note   CSN: [id number] Arrival date & time: 03/12/21  0340     History No chief complaint on file.   Theodore Lewis is a 25 y.o. male.  HPI Level 5 caveat due to nonverbal status.  History comes from patient's mother. Patient brought in for change in status.  Has been having difficulty eating.  Seems to not eat.  Potentially had sore throat.  Has maybe had some trouble swallowing.  Stools have gotten potentially little darker.  Has been seen virtually by PCP and started recently on penicillin and famotidine.  Mother stopped penicillin because she thought that his right side was shaking more.  Has a history of seizures 2.  States he has been more shaky recently.  Has been able to take his medicines.  Previously had a swelling on his right forehead/head.  Told by neurology was nothing to worry about.  Had a VP shunt placed when he was 31 months old.  He was done at [facility].  Patient's mother states the shunt is managed by pediatric neurology.    Past Medical History:  Diagnosis Date  . Anxiety   . Cerebral palsy (HCC)   . Congenital quadriparesis (HCC)   . Neuromuscular disorder (HCC)   . Seizure (HCC)   . Seizures (HCC)    last seizure 3 yrs ago    Patient Active Problem List   Diagnosis Date Noted  . Loss of weight 09/22/2020  . Oppositional behavior 09/22/2020  . Restlessness and agitation 04/20/2015  . Hamstring tightness of both lower extremities 04/29/2014  . Moderate intellectual disabilities 02/24/2013  . History of congenital brain abnormality 02/24/2013  . Obstructive hydrocephalus (HCC) 02/24/2013  . Congenital quadriplegia (HCC) 02/24/2013  . Generalized convulsive epilepsy (HCC) 02/24/2013  . Partial epilepsy with impairment of consciousness (HCC) 02/24/2013    Past Surgical History:  Procedure Laterality Date  . BRAIN SURGERY     Shunt placed when he was a yr. old  . I & D EXTREMITY Bilateral  04/29/2014   Procedure: IRRIGATION AND DEBRIDEMENT EXTREMITY;  Surgeon: Eldred Manges, MD;  Location: [facility] OR;  Service: Orthopedics;  Laterality: Bilateral;  Right and Left Hamstring Lenghening and Fiberglass Casting  . LEG TENDON SURGERY Bilateral approx 6 yrs ago       Family History  Problem Relation Age of Onset  . Throat cancer Paternal Grandfather        Died at 69  . Stomach cancer Paternal Grandfather   . Stomach cancer Paternal Grandmother        Died at 80  . Healthy Mother     Social History   Tobacco Use  . Smoking status: Never Smoker  . Smokeless tobacco: Never Used  Substance Use Topics  . Alcohol use: No  . Drug use: No    Home Medications Prior to Admission medications   Medication Sig Start Date End Date Taking? Authorizing Provider  CARBATROL 300 MG 12 hr capsule Take 1 capsule (300 mg total) by mouth 2 (two) times daily. 01/29/21  Yes Elveria Rising, NP  cetirizine (ZYRTEC) 10 MG tablet Take 10 mg by mouth daily.   Yes [provider]  DEPAKOTE SPRINKLES 125 MG capsule TAKE 5 CAPSULE BY MOUTH TWICE DAILY 01/29/21  Yes Elveria Rising, NP  diazepam (DIASTAT ACUDIAL) 10 MG GEL INSERT 10 MG RECTALLY AT ONSET OF SEIZURE 01/31/21  Yes Elveria Rising, NP  famotidine (PEPCID) 40 MG tablet Take 40 mg  by mouth daily. 03/07/21  Yes [provider]  diazepam (VALIUM) 1 MG/ML solution Give 1 ml at bedtime and give 44ml by mouth up to twice per day as needed for restlessness Patient not taking: Reported on 03/12/2021 09/20/19   Elveria Rising, NP  penicillin v potassium (VEETID) 250 MG/5ML solution Take 5 mLs by mouth every 8 (eight) hours. Every 8 hours for 7 days 03/07/21 03/14/21  [provider]    Allergies    Vancomycin  Review of Systems   Review of Systems  Constitutional: Positive for appetite change.    Physical Exam Updated Vital Signs BP 100/75   Pulse 88   Temp 97.9 F (36.6 C) (Axillary)   Resp 18   Ht 5' (1.524 m)    Wt 46.7 kg   SpO2 96%   BMI 20.12 kg/m   Physical Exam Vitals and nursing note reviewed.  HENT:     Head: Atraumatic.     Comments: Shunt to left skull.  Mild swelling of right temporal/forehead area.    Mouth/Throat:     Comments: Patient will not allow much evaluation of his mouth.  However no bleeding seen.  Unable to visualize posterior pharynx. Eyes:     General: No scleral icterus. Cardiovascular:     Rate and Rhythm: Regular rhythm.  Pulmonary:     Comments: Shunt along left anterior chest wall.  Potentially some discontinuity on anterior upper chest. Abdominal:     Tenderness: There is no abdominal tenderness.     Comments: No clear tenderness or mass of abdomen.  Musculoskeletal:        General: No tenderness.  Skin:    General: Skin is warm.     Capillary Refill: Capillary refill takes less than 2 seconds.  Neurological:     Mental Status: He is alert.     Comments: Patient is nonverbal at baseline.  Nonverbal now.  Will really not follow commands for me.     ED Results / Procedures / Treatments   Labs (all labs ordered are listed, but only abnormal results are displayed) Labs Reviewed  URINALYSIS, ROUTINE W REFLEX MICROSCOPIC - Abnormal; Notable for the following components:      Result Value   Hgb urine dipstick MODERATE (*)    Ketones, ur 5 (*)    Bacteria, UA RARE (*)    All other components within normal limits  COMPREHENSIVE METABOLIC PANEL - Abnormal; Notable for the following components:   Total Bilirubin 0.2 (*)    All other components within normal limits  CBC WITH DIFFERENTIAL/PLATELET - Abnormal; Notable for the following components:   Platelets 128 (*)    All other components within normal limits  VALPROIC ACID LEVEL - Abnormal; Notable for the following components:   Valproic Acid Lvl 26 (*)    All other components within normal limits  CARBAMAZEPINE LEVEL, TOTAL - Abnormal; Notable for the following components:   Carbamazepine Lvl 2.2 (*)     All other components within normal limits  AMMONIA    EKG None  Radiology DG Neck Soft Tissue  Result Date: 03/12/2021 CLINICAL DATA:  25 year old male shunt discontinuity in the chest. EXAM: NECK SOFT TISSUES - 1+ VIEW COMPARISON:  Chest radiograph today. FINDINGS: Left side shunt descending in the neck appears intact until the upper chest with discontinuity again noted at the anterior 1st rib level. Incomplete visualization of the intracranial portion and reservoir. No osseous abnormality identified. IMPRESSION: Solitary area of shunt catheter discontinuity at  the anterior 1st rib. Electronically Signed   By: Odessa FlemingH  Hall M.D.   On: 03/12/2021 07:41   DG Chest 1 View  Result Date: 03/12/2021 CLINICAL DATA:  25 year old male with persistent cough, dark stools. Cerebral palsy. EXAM: CHEST  1 VIEW COMPARISON:  Abdominal radiographs today reported separately. Portable chest 05/23/2017 and earlier. FINDINGS: Supine AP view at 0534 hours. Normal cardiac size and mediastinal contours. Visualized tracheal air column is within normal limits. Lung volumes are normal and both lungs are clear. No acute osseous abnormality identified. Chronic left side CSF shunt catheter has a new discontinuity at the level of the anterior 1st rib measuring about 8 mm. The remaining visible catheter tubing appears stable. IMPRESSION: 1. Chronic left side CSF shunt catheter has fractured and is now discontinuous at the level of the anterior 1st rib. 2. No cardiopulmonary abnormality. Electronically Signed   By: Odessa FlemingH  Hall M.D.   On: 03/12/2021 05:47   DG Abdomen 1 View  Result Date: 03/12/2021 CLINICAL DATA:  25 year old male with persistent cough, dark stools. Cerebral palsy. EXAM: ABDOMEN - 1 VIEW COMPARISON:  CT Abdomen and Pelvis 05/23/2017 and earlier. FINDINGS: Supine views of the abdomen and pelvis at 0536 hours. Chronic CSF shunt catheter coursing from the chest into the abdomen with no adverse features. Normal lung bases. Non  obstructed bowel gas pattern. Abdominal and pelvic visceral contours are within normal limits. Gracile osseous structures with chronically dislocated right hip. IMPRESSION: 1. Normal bowel gas pattern. 2. Chronic CSF shunt catheter with no adverse features. 3. Chronic osseous changes related to cerebral palsy. Electronically Signed   By: Odessa FlemingH  Hall M.D.   On: 03/12/2021 05:46   CT Head Wo Contrast  Result Date: 03/12/2021 CLINICAL DATA:  25 year old male with history of CSF shunt, cerebral palsy. Shunt discontinuity in the upper chest new since 2018. EXAM: CT HEAD WITHOUT CONTRAST TECHNIQUE: Contiguous axial images were obtained from the base of the skull through the vertex without intravenous contrast. COMPARISON:  Head CT 05/23/2017 and earlier. FINDINGS: Brain: Chronic rim calcified oval mass in the right hemisphere measures up to 12 cm in length on sagittal images (series 8, image 21 today versus series 5, image 34 previously) and appears stable and size and configuration. The mixed internal density of the lesion appears stable. No acute intracranial mass effect. Basilar cisterns remain patent. Superimposed dysplastic brain with corpus callosum dysgenesis, dysplastic ventricles, posterior left hemisphere and brainstem/cerebellar dysgenesis or atrophy. The intracranial portion of the CSF shunt appears stable. There is no ventriculomegaly. No acute intracranial hemorrhage identified. No cortically based acute infarct identified. Vascular: No suspicious intracranial vascular hyperdensity. Skull: Stable hyperostosis. No acute osseous abnormality identified. Sinuses/Orbits: Visualized paranasal sinuses and mastoids are stable and well aerated. Other: No acute orbit or scalp soft tissue finding. The reservoir and tubing appear intact over the superior left convexity. However at the level of the left mastoids there is a focal discontinuity of the tubing over a segment of 7 mm (series 5, image 54). IMPRESSION: 1.  Outside of the chest, a second new discontinuity of the shunt tubing is identified overlying the left mastoid. 2. But there is no ventriculomegaly. And the non contrast CT appearance of the brain is stable since 2018. Electronically Signed   By: Odessa FlemingH  Hall M.D.   On: 03/12/2021 10:25    Procedures Procedures   Medications Ordered in ED Medications  LORazepam (ATIVAN) injection 0.5 mg (0.5 mg Intravenous Given 03/12/21 0819)  LORazepam (ATIVAN) injection 0.5 mg (  0.5 mg Intravenous Given 03/12/21 0175)    ED Course  I have reviewed the triage vital signs and the nursing notes.  Pertinent labs & imaging results that were available during my care of the patient were reviewed by me and considered in my medical decision making (see chart for details).    MDM Rules/Calculators/A&P                         Patient presents reportedly with change in his eating status.  Patient is nonverbal.  Benign abdominal exam.  Mother states he may be grabbing towards abdomen and later states he may be grabbing towards his head and chest.  There is some discontinuity of his VP shunt catheter at the chest level.  We will get head CT to evaluate for worsening hydrocephalus.  Patient has his shunt managed by pediatric neurology at [facility].  Patient did not tolerate CT initially.  We will add some sedation to help get the procedure done.  Care turned over to Dr. Rodena Medin.  Final Clinical Impression(s) / ED Diagnoses Final diagnoses:  Shunt malfunction, initial encounter    Rx / DC Orders ED Discharge Orders    None       Benjiman Core, MD 03/13/21 0150

## 2021-03-12 NOTE — Discharge Instructions (Addendum)
Return for any problem.   Follow up with your regular neurology providers as instructed.   Follow up with [facility] Neurosurgery for further evaluation of Shunt as instructed.

## 2021-03-13 DIAGNOSIS — H6123 Impacted cerumen, bilateral: Secondary | ICD-10-CM | POA: Diagnosis not present

## 2021-03-13 DIAGNOSIS — Z982 Presence of cerebrospinal fluid drainage device: Secondary | ICD-10-CM | POA: Diagnosis not present

## 2021-03-13 DIAGNOSIS — G911 Obstructive hydrocephalus: Secondary | ICD-10-CM | POA: Diagnosis not present

## 2021-03-13 DIAGNOSIS — G40309 Generalized idiopathic epilepsy and epileptic syndromes, not intractable, without status epilepticus: Secondary | ICD-10-CM | POA: Diagnosis not present

## 2021-03-22 DIAGNOSIS — G919 Hydrocephalus, unspecified: Secondary | ICD-10-CM | POA: Diagnosis not present

## 2021-04-05 DIAGNOSIS — K219 Gastro-esophageal reflux disease without esophagitis: Secondary | ICD-10-CM | POA: Diagnosis not present

## 2021-04-05 DIAGNOSIS — G808 Other cerebral palsy: Secondary | ICD-10-CM | POA: Diagnosis not present

## 2021-04-05 DIAGNOSIS — F71 Moderate intellectual disabilities: Secondary | ICD-10-CM | POA: Diagnosis not present

## 2021-04-05 HISTORY — DX: Gastro-esophageal reflux disease without esophagitis: K21.9

## 2021-04-13 ENCOUNTER — Other Ambulatory Visit (INDEPENDENT_AMBULATORY_CARE_PROVIDER_SITE_OTHER): Payer: Self-pay | Admitting: Family

## 2021-04-13 DIAGNOSIS — R451 Restlessness and agitation: Secondary | ICD-10-CM

## 2021-04-13 MED ORDER — DIAZEPAM 1 MG/ML PO SOLN: ORAL | 5 refills | Status: DC

## 2021-05-08 ENCOUNTER — Encounter (INDEPENDENT_AMBULATORY_CARE_PROVIDER_SITE_OTHER): Payer: Self-pay | Admitting: Psychology

## 2021-05-21 ENCOUNTER — Ambulatory Visit (INDEPENDENT_AMBULATORY_CARE_PROVIDER_SITE_OTHER): Payer: Medicare Other | Admitting: Family

## 2021-05-21 ENCOUNTER — Encounter (INDEPENDENT_AMBULATORY_CARE_PROVIDER_SITE_OTHER): Payer: Self-pay | Admitting: Family

## 2021-05-21 ENCOUNTER — Other Ambulatory Visit: Payer: Self-pay

## 2021-05-21 VITALS — Wt 74.6 lb

## 2021-05-21 DIAGNOSIS — G40209 Localization-related (focal) (partial) symptomatic epilepsy and epileptic syndromes with complex partial seizures, not intractable, without status epilepticus: Secondary | ICD-10-CM | POA: Diagnosis not present

## 2021-05-21 DIAGNOSIS — G911 Obstructive hydrocephalus: Secondary | ICD-10-CM

## 2021-05-21 DIAGNOSIS — R634 Abnormal weight loss: Secondary | ICD-10-CM | POA: Diagnosis not present

## 2021-05-21 DIAGNOSIS — G808 Other cerebral palsy: Secondary | ICD-10-CM

## 2021-05-21 DIAGNOSIS — Z87728 Personal history of other specified (corrected) congenital malformations of nervous system and sense organs: Secondary | ICD-10-CM | POA: Diagnosis not present

## 2021-05-21 DIAGNOSIS — G40309 Generalized idiopathic epilepsy and epileptic syndromes, not intractable, without status epilepticus: Secondary | ICD-10-CM

## 2021-05-21 DIAGNOSIS — F71 Moderate intellectual disabilities: Secondary | ICD-10-CM

## 2021-05-21 NOTE — Progress Notes (Signed)
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 This was treated with VP shunt. He has resultant spastic quadriparesis, contractures, poor vision, evidence of an axial collection of mixed type in the right frontal-temporal-parietal region and possible infarction of the right brain with encephalomalacia of the left brain, as well as intermittent episode of agitation. He is taking and tolerating Depakote and Carbatrol for his seizure disorder. He is cared for at home by his parents, but his father has significant health problems that is limiting his ability to provide physical care  Today's concerns: Parents report today that Ivin BootyJoshua has been doing fairly well since his last visit. They say that his appetite is variable and that he is picky about what he will eat. He has been seen by a dietician and parents have tried to incorporate recommendations given.   Angelino's parents report that he has remained seizure free. He has not missed medication doses.   Ivin BootyJoshua was seen in the ED in May for generalized symptoms that parents felt could be related to his shunt. He had a CT of the head that revealed a disconnect of the shunt tubing in the region of the left mastoid. He was referred to neurosurgery for evaluation and parents report that replacement of the shunt was not advised.   Ivin BootyJoshua has been otherwise generally healthy since he was last seen. His parents have no other health concerns for him today other than previously mentioned.  Review of systems: Please see HPI  for neurologic and other pertinent review of systems. Otherwise all other systems were reviewed and were negative.  Problem List: Patient Active Problem List   Diagnosis Date Noted   Loss of weight 09/22/2020   Oppositional behavior 09/22/2020   Restlessness and agitation 04/20/2015   Hamstring tightness of both lower extremities 04/29/2014   Moderate intellectual disabilities 02/24/2013   History of congenital brain abnormality 02/24/2013   Obstructive hydrocephalus (HCC) 02/24/2013   Congenital quadriplegia (HCC) 02/24/2013   Generalized convulsive epilepsy (HCC) 02/24/2013   Partial epilepsy with impairment of consciousness (HCC) 02/24/2013     Past Medical History:  Diagnosis Date   Anxiety    Cerebral palsy (HCC)    Congenital quadriparesis (HCC)    Neuromuscular disorder (HCC)    Seizure (HCC)    Seizures (HCC)    last seizure 3 yrs ago    Past medical history comments: See HPI Copied from previous record: He presented at few months of age with massive hydrocephalus.  He required urgent placement of a ventriculoperitoneal shunt.  He has experienced revisions in the past.  Brain images in 2007 showed an extra-axial collection of mixed type in the right frontotemporal parietal region with possible infarction in the right brain and significant encephalomalacia of the left brain.  Ventricles were well decompressed.   A CT scan of the brain and shunt series Mar 04, 2013 showed microcephaly with marked diffuse thickening of the calvarium and marked enlargement of the mastoid sinus bilaterally. Left frontal shunt catheter extends to the region of the third ventricle and is unchanged. No hydrocephalus. There  is cerebellar atrophy bilaterally. 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 Brainstem is small. There is atrophy of the left occipital lobe. Agenesis of the corpus callosum.   Large extra-axial fluid collection on the right measures 4.6 x 11.1 cm.  This has low density centrally and the wall has calcified since the prior study. The fluid collection has matured and become better defined since the prior study. There is some mass effect on the right cerebral hemisphere. No midline shift to the left. No acute hemorrhage. Shunt tubing was intact from the ventricles to the abdomen.  Surgical history: Past Surgical History:  Procedure Laterality Date   BRAIN SURGERY     Shunt placed when he was a yr. old   I & D EXTREMITY Bilateral 04/29/2014   Procedure: IRRIGATION AND DEBRIDEMENT EXTREMITY;  Surgeon: Eldred Manges, MD;  Location: [facility] OR;  Service: Orthopedics;  Laterality: Bilateral;  Right and Left Hamstring Lenghening and Fiberglass Casting   LEG TENDON SURGERY Bilateral approx 6 yrs ago    Family history: family history includes Healthy in his mother; Stomach cancer in his paternal grandfather and paternal grandmother; Throat cancer in his paternal grandfather.   Social history: Social History   Socioeconomic History   Marital status: Single    Spouse name: Not on file   Number of children: Not on file   Years of education: Not on file   Highest education level: Not on file  Occupational History   Not on file  Tobacco Use   Smoking status: Never   Smokeless tobacco: Never  Substance and Sexual Activity   Alcohol use: No   Drug use: No   Sexual activity: Never  Other Topics Concern   Not on file  Social History Narrative   Not on file   Social Determinants of Health   Financial Resource Strain: Not on file  Food Insecurity: Not on file  Transportation Needs: Not on file  Physical Activity: Not on file  Stress: Not on file  Social Connections: Not on file  Intimate Partner Violence: Not on file    Past/failed meds:  Allergies: Allergies  Allergen Reactions   Penicillins Other (See Comments)    Right side of body started shaking     Vancomycin Rash    Immunizations: Immunization History  Administered Date(s)  Administered   PFIZER(Purple Top)SARS-COV-2 Vaccination 04/04/2020    Diagnostics/Screenings: Copied from previous record: 03/12/2021 - CT head wo contrast - 1. Outside of the chest, a second new discontinuity of the shunt tubing is identified overlying the left mastoid. 2. But there is no ventriculomegaly. And the non contrast CT appearance of the brain is stable since 2018.   05/23/2017 - CT head wo contrast - No acute finding. Chronic microcephaly and brain atrophy. Chronic calcified subdural collection on the right, not significantly changed since 03/04/2013. No evidence of shunt malfunction. Sinuses clear   Physical Exam: Wt 74 lb 9.6 oz (33.8 kg)   BMI 14.57 kg/m   General: well developed, well nourished young man, seated in wheelchair, in no evident distress; black hair, brown eyes, right handed Head: normocephalic and atraumatic. Oropharynx difficult to examine due to his inability to cooperate with examination but appears benign. No dysmorphic features. Neck: supple Cardiovascular: regular rate and rhythm, no murmurs. Respiratory: clear to auscultation bilaterally Abdomen: bowel sounds present all four quadrants, abdomen soft, non-tender, non-distended.  Musculoskeletal: thin extremities with significant muscle wasting. Has contractures and increased tone at the hips,  knees, ankles and elbows.  Skin: no rashes or neurocutaneous lesions  Neurologic Exam Mental Status: awake and fully alert. Has no language but has some signs. Signed to me several times that he wanted food. Does not smile responsively. Resistant to invasions in to his space Cranial Nerves: fundoscopic exam - red reflex present.  Unable to fully visualize fundus.  Pupils equal briskly reactive to light.  Turns to localize faces and objects in the periphery. Turns to localize sounds in the periphery. Facial movements are asymmetric, has lower facial weakness with drooling.  Motor: spastic quadriparesis with increased  tone in the lower extremities. Clumsy purposeful movements.  Sensory: withdrawal x 4 Coordination: unable to adequately assess due to patient's inability to participate in examination. No dysmetria when reaching for objects. Gait and Station: unable to stand and bear weight. Reflexes: unable to adequately assess due to patient's inability to cooperate with examination  Impression: Generalized convulsive epilepsy (HCC)  Obstructive hydrocephalus (HCC)  Congenital quadriplegia (HCC)  Partial epilepsy with impairment of consciousness (HCC)  Moderate intellectual disabilities  History of congenital brain abnormality  Loss of weight   Recommendations for plan of care: The patient's previous [facility] records were reviewed. Marisol has neither had nor required imaging or lab studies since the last visit. He is a 25 year old young man with history of acquired obstructive hydrocephalus s/p VP shunt, seizures, spastic quadriparesis, moderate intellectual disability, episodes of agitation and weight loss. He is taking and tolerating brand Carbatrol and Depakote and has remained seizure free since his last visit. He was seen in the ED in May and found to have disconnect in his shunt and was referred to neurosurgery. Replacement of the shunt was deemed unnecessary. Jayd has had weight loss and is down again several pounds today. I talked with his parents about the need for him to have adequate calorie intake, and ways to get him to consume more calories. I will continue to monitor his weight closely.   I will see Terri back in follow up in 4 months or sooner if needed. His parents agreed with the plans made today.   The medication list was reviewed and reconciled. No changes were made in the prescribed medications today. A complete medication list was provided to the patient.  Return in about 4 months (around 09/21/2021).   Allergies as of 05/21/2021       Reactions   Penicillins Other (See  Comments)   Right side of body started shaking    Vancomycin Rash        Medication List        Accurate as of May 21, 2021 11:59 PM. If you have any questions, ask your nurse or doctor.          Carbatrol 300 MG 12 hr capsule Generic drug: carbamazepine Take 1 capsule (300 mg total) by mouth 2 (two) times daily.   cetirizine 10 MG tablet Commonly known as: ZYRTEC Take 10 mg by mouth daily.   Depakote Sprinkles 125 MG capsule Generic drug: divalproex TAKE 5 CAPSULE BY MOUTH TWICE DAILY   diazepam 1 MG/ML solution Commonly known as: VALIUM Give 1 ml at bedtime and give 9ml by mouth up to twice per day as needed for restlessness   diazepam 10 MG Gel Commonly known as: DIASTAT ACUDIAL INSERT 10 MG RECTALLY AT ONSET OF SEIZURE   famotidine 40 MG tablet Commonly known as: PEPCID Take 40 mg by mouth daily.  Total time spent with the patient was 25 minutes, of which 50% or more was spent in counseling and coordination of care.  Elveria Rising NP-C [facility] Ph. [phone] Fax [phone]

## 2021-05-27 ENCOUNTER — Encounter (INDEPENDENT_AMBULATORY_CARE_PROVIDER_SITE_OTHER): Payer: Self-pay | Admitting: Family

## 2021-05-27 NOTE — Patient Instructions (Signed)
Thank you for coming in today.   Instructions for you until your next appointment are as follows: Continue Kenwood's medications as prescribed Let me know if he has seizures Try to work on getting him to eat more food and to add calories to the foods as we discussed. I am concerned about his ongoing weight loss.   Please sign up for MyChart if you have not done so.   Please plan to return for follow up in 4 months or sooner if needed.  At [facility], we are committed to providing exceptional care. You will receive a patient satisfaction survey through text or email regarding your visit today. Your opinion is important to me. Comments are appreciated.

## 2021-07-24 DIAGNOSIS — Z23 Encounter for immunization: Secondary | ICD-10-CM | POA: Diagnosis not present

## 2021-08-02 ENCOUNTER — Ambulatory Visit (INDEPENDENT_AMBULATORY_CARE_PROVIDER_SITE_OTHER): Payer: Medicare Other | Admitting: Internal Medicine

## 2021-08-02 ENCOUNTER — Other Ambulatory Visit: Payer: Self-pay

## 2021-08-02 ENCOUNTER — Encounter: Payer: Self-pay | Admitting: Internal Medicine

## 2021-08-02 VITALS — BP 122/72 | HR 78 | Resp 16 | Ht 60.0 in | Wt <= 1120 oz

## 2021-08-02 DIAGNOSIS — R64 Cachexia: Secondary | ICD-10-CM | POA: Diagnosis not present

## 2021-08-02 DIAGNOSIS — D696 Thrombocytopenia, unspecified: Secondary | ICD-10-CM

## 2021-08-02 DIAGNOSIS — R634 Abnormal weight loss: Secondary | ICD-10-CM

## 2021-08-02 DIAGNOSIS — J301 Allergic rhinitis due to pollen: Secondary | ICD-10-CM | POA: Diagnosis not present

## 2021-08-02 LAB — CBC WITH DIFFERENTIAL/PLATELET
Basophils Absolute: 0 10*3/uL (ref 0.0–0.1)
Basophils Relative: 0.6 % (ref 0.0–3.0)
Eosinophils Absolute: 0 10*3/uL (ref 0.0–0.7)
Eosinophils Relative: 0.5 % (ref 0.0–5.0)
HCT: 40.8 % (ref 39.0–52.0)
Hemoglobin: 14.4 g/dL (ref 13.0–17.0)
Lymphocytes Relative: 29.1 % (ref 12.0–46.0)
Lymphs Abs: 1.2 10*3/uL (ref 0.7–4.0)
MCHC: 35.3 g/dL (ref 30.0–36.0)
MCV: 98.6 fl (ref 78.0–100.0)
Monocytes Absolute: 0.4 10*3/uL (ref 0.1–1.0)
Monocytes Relative: 9.4 % (ref 3.0–12.0)
Neutro Abs: 2.5 10*3/uL (ref 1.4–7.7)
Neutrophils Relative %: 60.4 % (ref 43.0–77.0)
Platelets: 84 10*3/uL — ABNORMAL LOW (ref 150.0–400.0)
RBC: 4.14 Mil/uL — ABNORMAL LOW (ref 4.22–5.81)
RDW: 16.1 % — ABNORMAL HIGH (ref 11.5–15.5)
WBC: 4.1 10*3/uL (ref 4.0–10.5)

## 2021-08-02 LAB — BASIC METABOLIC PANEL
BUN: 13 mg/dL (ref 6–23)
CO2: 28 mEq/L (ref 19–32)
Calcium: 9.5 mg/dL (ref 8.4–10.5)
Chloride: 101 mEq/L (ref 96–112)
Creatinine, Ser: 0.57 mg/dL (ref 0.40–1.50)
GFR: 136.17 mL/min (ref >60.00)
Glucose, Bld: 108 mg/dL — ABNORMAL HIGH (ref 70–99)
Potassium: 4.3 mEq/L (ref 3.5–5.1)
Sodium: 138 mEq/L (ref 135–145)

## 2021-08-02 LAB — FOLATE: Folate: 6.4 ng/mL (ref >5.9)

## 2021-08-02 LAB — HEPATIC FUNCTION PANEL
ALT: 32 U/L (ref 0–53)
AST: 32 U/L (ref 0–37)
Albumin: 4.2 g/dL (ref 3.5–5.2)
Alkaline Phosphatase: 86 U/L (ref 39–117)
Bilirubin, Direct: 0.1 mg/dL (ref 0.0–0.3)
Total Bilirubin: 0.4 mg/dL (ref 0.2–1.2)
Total Protein: 7.5 g/dL (ref 6.0–8.3)

## 2021-08-02 LAB — VITAMIN B12: Vitamin B-12: 322 pg/mL (ref 211–911)

## 2021-08-02 MED ORDER — CETIRIZINE HCL 5 MG/5ML PO SOLN: 10.0000 mg | Freq: Every day | ORAL | 1 refills | Status: DC

## 2021-08-02 NOTE — Progress Notes (Signed)
Subjective:  Patient ID: Theodore Lewis, male    DOB: 21-Aug-1996  Age: 24 y.o. MRN: [id number]  CC: Establish Care  This visit occurred during the SARS-CoV-2 public health emergency.  Safety protocols were in place, including screening questions prior to the visit, additional usage of staff PPE, and extensive cleaning of exam room while observing appropriate contact time as indicated for disinfecting solutions.    HPI Theodore Lewis presents to establish.  Theodore Lewis was premature.  Theodore Lewis has cerebral palsy with blindness and muteness.  Theodore Lewis has history of seizure disorders but has not recently had any seizures.  Theodore Lewis has a malfunctioning shunt.  Theodore Lewis is with his mother today.  Her only concern is that she thinks Theodore Lewis is losing weight.  Theodore Lewis has a good appetite.  Theodore Lewis has had a low platelet count for many years.  There is no history of bleeding or bruising.  History Theodore Lewis has a past medical history of Anxiety, Cerebral palsy (HCC), Congenital quadriparesis (HCC), Neuromuscular disorder (HCC), Seizure (HCC), and Seizures (HCC).   Theodore Lewis has a past surgical history that includes Brain surgery; Leg Tendon Surgery (Bilateral, approx 6 yrs ago); and I & D extremity (Bilateral, 04/29/2014).   His family history includes Cancer in his paternal grandfather and paternal grandmother; Diabetes in his father and mother; Early death in his paternal grandfather and paternal grandmother; Healthy in his mother; Heart disease in his father, maternal grandfather, and mother; Hyperlipidemia in his father, maternal grandfather, and mother; Hypertension in his father, maternal grandfather, and maternal grandmother; Intellectual disability in his maternal grandfather; Learning disabilities in his father; Stomach cancer in his paternal grandfather and paternal grandmother; Throat cancer in his paternal grandfather.Theodore Lewis reports that Theodore Lewis has never smoked. Theodore Lewis has never used smokeless tobacco. Theodore Lewis reports that Theodore Lewis does not drink alcohol and does not  use drugs.  Outpatient Medications Prior to Visit  Medication Sig Dispense Refill   CARBATROL 300 MG 12 hr capsule Take 1 capsule (300 mg total) by mouth 2 (two) times daily. 62 capsule 5   DEPAKOTE SPRINKLES 125 MG capsule TAKE 5 CAPSULE BY MOUTH TWICE DAILY 310 capsule 5   diazepam (DIASTAT ACUDIAL) 10 MG GEL INSERT 10 MG RECTALLY AT ONSET OF SEIZURE 1 each 5   diazepam (VALIUM) 1 MG/ML solution Give 1 ml at bedtime and give 12ml by mouth up to twice per day as needed for restlessness 90 mL 5   famotidine (PEPCID) 40 MG tablet Take 40 mg by mouth daily.     cetirizine (ZYRTEC) 10 MG tablet Take 10 mg by mouth daily.     No facility-administered medications prior to visit.    ROS Review of Systems  Constitutional:  Positive for unexpected weight change.  HENT:  Positive for postnasal drip and rhinorrhea.   Respiratory:  Negative for cough.   Cardiovascular:  Negative for leg swelling.  Gastrointestinal:  Negative for abdominal pain, diarrhea and nausea.  Genitourinary: Negative.   Skin:  Negative for color change and rash.  Hematological:  Negative for adenopathy. Does not bruise/bleed easily.  Psychiatric/Behavioral: Negative.    All other systems reviewed and are negative.  Objective:  BP 122/72 (BP Location: Left Arm, Patient Position: Sitting, Cuff Size: Large)   Pulse 78   Ht 5' (1.524 m)   Wt 60 lb (27.2 kg)   SpO2 96%   BMI 11.72 kg/m   Physical Exam Vitals reviewed.  Constitutional:      Appearance: Theodore Lewis is ill-appearing.  HENT:     Nose: Nose normal.     Mouth/Throat:     Mouth: Mucous membranes are moist.  Eyes:     Conjunctiva/sclera: Conjunctivae normal.  Cardiovascular:     Rate and Rhythm: Normal rate and regular rhythm.     Heart sounds: No murmur heard. Pulmonary:     Effort: Pulmonary effort is normal.     Breath sounds: No stridor. No wheezing, rhonchi or rales.  Abdominal:     General: Abdomen is flat.     Palpations: There is no mass.      Tenderness: There is no abdominal tenderness. There is no guarding.  Musculoskeletal:        General: Normal range of motion.     Cervical back: Neck supple.     Right lower leg: No edema.     Left lower leg: No edema.  Lymphadenopathy:     Cervical: No cervical adenopathy.  Skin:    General: Skin is warm and dry.     Findings: No bruising.  Neurological:     Mental Status: Mental status is at baseline.     Comments: In a wheelchair Weakness and atrophy in BU and LE's    Lab Results  Component Value Date   WBC 4.1 08/02/2021   HGB 14.4 08/02/2021   HCT 40.8 08/02/2021   PLT 84.0 (L) 08/02/2021   GLUCOSE 108 (H) 08/02/2021   ALT 32 08/02/2021   AST 32 08/02/2021   NA 138 08/02/2021   K 4.3 08/02/2021   CL 101 08/02/2021   CREATININE 0.57 08/02/2021   BUN 13 08/02/2021   CO2 28 08/02/2021     Assessment & Plan:   Theodore Lewis was seen today for establish care.  Diagnoses and all orders for this visit:  Thrombocytopenia (HCC)- This has been present for many years.  There is no history of bleeding or bruising.  B12 and folate are normal.  This is likely ITP. -     CBC with Differential/Platelet; Future -     Vitamin B12; Future -     Folate; Future -     Folate -     Vitamin B12 -     CBC with Differential/Platelet  Loss of weight- Labs are negative for secondary causes.  I have encouraged his mother to let him have more food. -     Basic metabolic panel; Future -     Thyroid Panel With TSH; Future -     Hepatic function panel; Future -     Prealbumin; Future -     Prealbumin -     Hepatic function panel -     Thyroid Panel With TSH -     Basic metabolic panel  Seasonal allergic rhinitis due to pollen -     cetirizine HCl (ZYRTEC) 5 MG/5ML SOLN; Take 10 mLs (10 mg total) by mouth daily.  Cachexia [facility])- His labs are reassuring. -     Basic metabolic panel; Future -     Thyroid Panel With TSH; Future -     Hepatic function panel; Future -     Prealbumin;  Future -     Prealbumin -     Hepatic function panel -     Thyroid Panel With TSH -     Basic metabolic panel  I have discontinued Alric Seton. Repka's cetirizine. I am also having him start on cetirizine HCl. Additionally, I am having him maintain his Carbatrol, Depakote Sprinkles, diazepam, famotidine,  and diazepam.  Meds ordered this encounter  Medications   cetirizine HCl (ZYRTEC) 5 MG/5ML SOLN    Sig: Take 10 mLs (10 mg total) by mouth daily.    Dispense:  473 mL    Refill:  1     Follow-up: Return in about 3 months (around 11/01/2021).  Sanda Linger, MD

## 2021-08-02 NOTE — Patient Instructions (Signed)
Thrombocytopenia Thrombocytopenia is a condition in which you have a low number of platelets in your blood. Platelets are also called thrombocytes. Platelets are tiny cells in the blood. When you bleed, they clump together at the cut or injury to stop the bleeding. This is called blood clotting. Not having enough platelets can cause bleeding problems. Some cases of thrombocytopenia are mild while others are more severe. What are the causes? This condition may be caused by: Decreased production of platelets. This may be caused by: Aplastic anemia. This is when your bone marrow stops making blood cells. Cancer in the bone marrow. Certain medicines, including chemotherapy. Infection in the bone marrow. Drinking a lot of alcohol. Increased destruction of platelets. This may be caused by: Certain immune diseases. Certain medicines. Certain blood clotting disorders. Certain inherited disorders. Certain bleeding disorders. Pregnancy. Having an enlarged spleen (hypersplenism). In hypersplenism, the spleen gathers up platelets from circulation. This means that the platelets are not available to help with blood clotting. The spleen can be enlarged because of cirrhosis or other conditions. What are the signs or symptoms? Symptoms of this condition are the result of poor blood clotting. They will vary depending on how low the platelet counts are. Symptoms may include: Abnormal bleeding. Nosebleeds. Heavy menstrual periods. Blood in the urine or stool (feces). A purplish discoloration in the skin (purpura). Bruising. A rash that looks like pinpoint, purplish-red spots (petechiae) on the skin and mucous membranes. How is this diagnosed? This condition may be diagnosed with blood tests and a physical exam. Sometimes, a sample of bone marrow may be removed to look for the original cells (megakaryocytes) that make platelets. Other tests may be needed depending on the cause. How is this  treated? Treatment for this condition depends on the cause. Treatment options may include: Treatment of another condition that is causing the low platelet count. Medicines to help protect your platelets from being destroyed. A replacement (transfusion) of platelets to stop or prevent bleeding. Surgery to remove the spleen. Follow these instructions at home: Activity Avoid activities that could cause injury or bruising, and follow instructions about how to prevent falls. Take extra care not to cut yourself when you shave or when you use scissors, needles, knives, and other tools. Take extra care to protect yourself from burns when ironing or cooking. General instructions  Check your skin and the inside of your mouth for bruising or bleeding as told by your health care provider. Check your spit (sputum), urine, and stool for blood as told by your health care provider. Do not drink alcohol. Take over-the-counter and prescription medicines only as told by your health care provider. Do not take any medicines that have aspirin or NSAIDs in them. These medicines can thin your blood and cause you to bleed more easily. Tell all your health care providers, including dentists and eye doctors, about your condition. Contact a health care provider if you have: Unexplained bruising. Get help right away if you have: Active bleeding from anywhere on your body. Blood in your sputum, urine, or stool. Summary Thrombocytopenia is a condition in which you have a low number of platelets in your blood. Platelets are needed for blood clotting. Symptoms of this condition are the result of poor blood clotting and may include abnormal bleeding, nosebleeds, and bruising. This condition may be diagnosed with blood tests and a physical exam. Treatment for this condition depends on the cause. This information is not intended to replace advice given to you by your health   care provider. Make sure you discuss any  questions you have with your health care provider. Document Revised: 01/30/2021 Document Reviewed: 07/23/2018 Elsevier Patient Education  2022 [employer]  

## 2021-08-07 ENCOUNTER — Other Ambulatory Visit (INDEPENDENT_AMBULATORY_CARE_PROVIDER_SITE_OTHER): Payer: Self-pay | Admitting: Family

## 2021-08-07 ENCOUNTER — Telehealth (INDEPENDENT_AMBULATORY_CARE_PROVIDER_SITE_OTHER): Payer: Self-pay | Admitting: Family

## 2021-08-07 DIAGNOSIS — G40209 Localization-related (focal) (partial) symptomatic epilepsy and epileptic syndromes with complex partial seizures, not intractable, without status epilepticus: Secondary | ICD-10-CM

## 2021-08-07 DIAGNOSIS — G40309 Generalized idiopathic epilepsy and epileptic syndromes, not intractable, without status epilepticus: Secondary | ICD-10-CM

## 2021-08-07 MED ORDER — DEPAKOTE SPRINKLES 125 MG PO CSDR: DELAYED_RELEASE_CAPSULE | ORAL | 5 refills | Status: DC

## 2021-08-07 MED ORDER — CARBATROL 300 MG PO CP12: 300.0000 mg | ORAL_CAPSULE | Freq: Two times a day (BID) | ORAL | 5 refills | Status: DC

## 2021-08-07 NOTE — Telephone Encounter (Signed)
The prescriptions were faxed to the pharmacy. Please let Mom know. TG

## 2021-08-07 NOTE — Telephone Encounter (Signed)
  Who's calling (name and relationship to patient) : Ernie Kasler (mom)  Best contact number: [phone]( home)  Provider they see: Dr. Clyda Greener  Reason for call: Mom states that Medication needs to be approved so that patient can take meds on time. Requested Call back  PRESCRIPTION REFILL ONLY  Name of prescription: Carbatrol Depakote  Pharmacy: #[id number] [facility] [address] [place].

## 2021-08-07 NOTE — Telephone Encounter (Signed)
Spoke with mom and let her know the prescriptions were sent. Mom states gratitude and ended the call.

## 2021-08-08 NOTE — Telephone Encounter (Signed)
Contacted pharmacy to let them know a PA was obtained and they should be able to fill that. They inform it has already been run and the prescription is ready for pick up.  Left HIPAA compliant voicemail letting mom know the above details.

## 2021-08-08 NOTE — Telephone Encounter (Signed)
Mom states that depakote was not sent to the pharmacy and that there may need to be a prior auth.

## 2021-09-09 ENCOUNTER — Other Ambulatory Visit: Payer: Self-pay | Admitting: Internal Medicine

## 2021-09-24 ENCOUNTER — Telehealth (INDEPENDENT_AMBULATORY_CARE_PROVIDER_SITE_OTHER): Payer: Medicare Other | Admitting: Family

## 2021-10-03 ENCOUNTER — Other Ambulatory Visit: Payer: Self-pay | Admitting: Internal Medicine

## 2021-10-03 ENCOUNTER — Telehealth: Payer: Self-pay | Admitting: Internal Medicine

## 2021-10-03 NOTE — Telephone Encounter (Signed)
  Patient's mother Theodore Lewis states motor in [place] ramp for patient's wheelchair has went out  [employer] [facility] is requesting a letter from provider stating the medical necessaries for ramp in order to get the ramp fixed  I asked caller if [facility] provided her w/ paper work for provider to fill out, she stated they did not  Caller is requesting a call back to discuss letter  Please call [phone] if caller can not be reached on home number

## 2021-10-09 NOTE — Telephone Encounter (Signed)
Pt's mother, [name], has been informed that letter is completed.  I have mailed it to her per her request.

## 2021-11-12 ENCOUNTER — Telehealth (INDEPENDENT_AMBULATORY_CARE_PROVIDER_SITE_OTHER): Payer: Medicare Other | Admitting: Family

## 2021-11-12 ENCOUNTER — Other Ambulatory Visit: Payer: Self-pay

## 2021-11-12 ENCOUNTER — Encounter (INDEPENDENT_AMBULATORY_CARE_PROVIDER_SITE_OTHER): Payer: Self-pay | Admitting: Family

## 2021-11-12 VITALS — Wt <= 1120 oz

## 2021-11-12 DIAGNOSIS — G40309 Generalized idiopathic epilepsy and epileptic syndromes, not intractable, without status epilepticus: Secondary | ICD-10-CM | POA: Diagnosis not present

## 2021-11-12 DIAGNOSIS — G808 Other cerebral palsy: Secondary | ICD-10-CM

## 2021-11-12 DIAGNOSIS — G40209 Localization-related (focal) (partial) symptomatic epilepsy and epileptic syndromes with complex partial seizures, not intractable, without status epilepticus: Secondary | ICD-10-CM

## 2021-11-12 DIAGNOSIS — R634 Abnormal weight loss: Secondary | ICD-10-CM

## 2021-11-12 DIAGNOSIS — F71 Moderate intellectual disabilities: Secondary | ICD-10-CM

## 2021-11-12 DIAGNOSIS — M6281 Muscle weakness (generalized): Secondary | ICD-10-CM | POA: Diagnosis not present

## 2021-11-12 NOTE — Progress Notes (Signed)
This is a Pediatric Specialist E-Visit consult/follow up provided via My [place] and his mother Rana Barrus consented to an E-Visit consult today.  Location of patient: Jametrius is at home Location of provider: Normand Sloop is at office Patient was referred by Janith Lima, MD   The following participants were involved in this E-Visit: CMA, NP, patient's mother  This visit was done via VIDEO   Chief Complain/ Reason for E-Visit today: concern about muscle strength Total time on call: 15 min Follow up: 6 months   [facility] [place]   MRN:  [id number]  [birth year]   Provider: Rockwell Germany NP-C Location of Care: [facility]  Visit type: Return visit  Last visit: 05/21/2021  Referral source: Precious Haws, MD History from: parents and Epic chart  Brief history:  Copied from previous record: History of seizures, quadriparesis and moderate intellectual disability 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 This was treated with VP shunt. He has resultant spastic quadriparesis, contractures, poor vision, evidence of an axial collection of mixed type in the right frontal-temporal-parietal region and possible infarction of the right brain with encephalomalacia of the left brain, as well as intermittent episode of agitation. He is taking and tolerating Depakote and Carbatrol for his seizure disorder. He is cared for at home by his parents, but his father has significant health problems that is limiting his ability to provide physical care  Today's concerns: Mom reports today that Jamin is less able to help position himself in bed and less able to help with transfers. She wonders if his muscles are getting weak. She also feels that his legs are tighter and wonders if he needs AFO's as he used to wear in the past.   Mom reports that [place] has a good appetite and eats well, as  long as he gets the foods that he wants. He has a diet of largely chicken nuggets and french fries because the will not eat other foods for the most part. Mom says that she tries to get him to drink a Pediasure at least once per day.   Mom reports that Glendal has not had seizures and that he has been otherwise generally healthy since he was last seen. Mom has no other health concerns for Shreyash today other than previously mentioned.  Review of systems: Please see HPI for neurologic and other pertinent review of systems. Otherwise all other systems were reviewed and were negative.  Problem List: Patient Active Problem List   Diagnosis Date Noted   Thrombocytopenia ([place]) 08/02/2021   Seasonal allergic rhinitis due to pollen 08/02/2021   Cachexia ([place]) 08/02/2021   Loss of weight 09/22/2020   Oppositional behavior 09/22/2020   Restlessness and agitation 04/20/2015   Hamstring tightness of both lower extremities 04/29/2014   Moderate intellectual disabilities 02/24/2013   History of congenital brain abnormality 02/24/2013   Obstructive hydrocephalus ([place]) 02/24/2013   Congenital quadriplegia ([place]) 02/24/2013   Generalized convulsive epilepsy ([place]) 02/24/2013   Partial epilepsy with impairment of consciousness ([place]) 02/24/2013     Past Medical History:  Diagnosis Date   Anxiety    Cerebral palsy ([place])    Congenital quadriparesis ([place])    Neuromuscular disorder ([place])    Seizure ([place])    Seizures ([place])    last seizure 3 yrs ago    Past medical history comments: See HPI Copied from previous record: He presented  at few months of age with massive hydrocephalus.  He required urgent placement of a ventriculoperitoneal shunt.  He has experienced revisions in the past.  Brain images in 2007 showed an extra-axial collection of mixed type in the right frontotemporal parietal region with possible infarction in the right brain and significant encephalomalacia of the left brain.  Ventricles were  well decompressed.   A CT scan of the brain and shunt series Mar 04, 2013 showed microcephaly with marked diffuse thickening of the calvarium and marked enlargement of the mastoid sinus bilaterally. Left frontal shunt catheter extends to the region of the third ventricle and is unchanged. No hydrocephalus. There is cerebellar atrophy bilaterally. 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 Brainstem is small. There is atrophy of the left occipital lobe. Agenesis of the corpus callosum.   Large extra-axial fluid collection on the right measures 4.6 x 11.1 cm. This has low density centrally and the wall has calcified since the prior study. The fluid collection has matured and become better defined since the prior study. There is some mass effect on the right cerebral hemisphere. No midline shift to the left. No acute hemorrhage. Shunt tubing was intact from the ventricles to the abdomen.  Surgical history: Past Surgical History:  Procedure Laterality Date   BRAIN SURGERY     Shunt placed when he was a yr. old   I & D EXTREMITY Bilateral 04/29/2014   Procedure: IRRIGATION AND DEBRIDEMENT EXTREMITY;  Surgeon: Marybelle Killings, MD;  Location: [place] [facility];  Service: Orthopedics;  Laterality: Bilateral;  Right and Left Hamstring Lenghening and Fiberglass Casting   LEG TENDON SURGERY Bilateral approx 6 yrs ago     Family history: family history includes Cancer in his paternal grandfather and paternal grandmother; Diabetes in his father and mother; Early death in his paternal grandfather and paternal grandmother; Healthy in his mother; Heart disease in his father, maternal grandfather, and mother; Hyperlipidemia in his father, maternal grandfather, and mother; Hypertension in his father, maternal grandfather, and maternal grandmother; Intellectual disability in his maternal grandfather; Learning disabilities in his father; Stomach cancer in his paternal grandfather and paternal grandmother;  Throat cancer in his paternal grandfather.   Social history: Social History   Socioeconomic History   Marital status: Single    Spouse name: Not on file   Number of children: Not on file   Years of education: Not on file   Highest education level: Not on file  Occupational History   Not on file  Tobacco Use   Smoking status: Never   Smokeless tobacco: Never  Substance and Sexual Activity   Alcohol use: No   Drug use: No   Sexual activity: Never  Other Topics Concern   Not on file  Social History Narrative   Not on file   Social Determinants of Health   Financial Resource Strain: Not on file  Food Insecurity: Not on file  Transportation Needs: Not on file  Physical Activity: Not on file  Stress: Not on file  Social Connections: Not on file  Intimate Partner Violence: Not on file    Past/failed meds:  Allergies: Allergies  Allergen Reactions   Penicillins Other (See Comments)    Right side of body started shaking     Vancomycin Rash    Immunizations: Immunization History  Administered Date(s) Administered   Influenza-Unspecified 07/24/2021   PFIZER(Purple Top)SARS-COV-2 Vaccination 03/13/2020, 04/04/2020, 10/05/2020    Diagnostics/Screenings: Copied from previous record: 03/12/2021 - CT head  wo contrast - 1. Outside of the chest, a second new discontinuity of the shunt tubing is identified overlying the left mastoid. 2. But there is no ventriculomegaly. And the non contrast CT appearance of the brain is stable since 2018.   05/23/2017 - CT head wo contrast - No acute finding. Chronic microcephaly and brain atrophy. Chronic calcified subdural collection on the right, not significantly changed since 03/04/2013. No evidence of shunt malfunction. Sinuses clear   Physical Exam: Wt 70 lb (31.8 kg)    BMI 13.67 kg/m   Examination was limited by poor video quality General: thin but otherwise well developed, well nourished man, seated in wheelchair in no evident  distress Head: normocephalic and atraumatic. No dysmorphic features. Neck: supple Musculoskeletal: thin extremities with significant muscle wasting. Has flexion contractures Skin: no rashes or neurocutaneous lesions  Neurologic Exam Mental Status: awake and fully alert. Has no language but has some signs.  Cranial Nerves: turns to localize faces and objects in the periphery. Turns to localize sounds in the periphery. Facial movements are asymmetric, has lower facial weakness with drooling.  Motor: spastic quadriparesis  Sensory: withdrawal x 4 Coordination: unable to adequately assess due to patient's inability to participate in examination. No dysmetria when reaching for objects. Gait and Station: unable to stand and bear weight.  Impression: Congenital quadriplegia ([place]) - Plan: Ambulatory referral to Physical Therapy  Decreased motor strength - Plan: Ambulatory referral to Physical Therapy  Generalized convulsive epilepsy ([place])  Partial epilepsy with impairment of consciousness ([place])  Moderate intellectual disabilities  Loss of weight    Recommendations for plan of care: The patient's previous [facility] records were reviewed. Omarri has neither had nor required imaging or lab studies since the last visit. He is a 26 year old young man with history of quadriparesis, epilepsy, moderate intellectual disability, s/p VP shunt. He is taking and tolerating brand Depakote and Carbatrol and has remained seizure free since his last visit. He has experienced weight loss over time and has been seen by a dietician. Heitor's mother reports that he is less able to help with transitions or positioning himself in bed. I recommended in home physical therapy but also talked with Mom about his nutrition. I am very concerned that Merrick has lost weight over time and that his diet is so limited. I encouraged her to be sure to offer him Pediasure at least 2 times per day. If he continues to lose weight or  further restricts his diet, we will have to consider a gastrostomy tube in order to provide nutrition for him.   I will see Batuhan back in follow up in 6 months or sooner if needed. Mom agreed with the plans made today.   The medication list was reviewed and reconciled. No changes were made in the prescribed medications today. A complete medication list was provided to the patient.  Orders Placed This Encounter  Procedures   Ambulatory referral to Physical Therapy    Referral Priority:   Routine    Referral Type:   Physical Medicine    Referral Reason:   Specialty Services Required    Requested Specialty:   Physical Therapy    Number of Visits Requested:   1    Return in about 6 months (around 05/12/2022).   Allergies as of 11/12/2021       Reactions   Penicillins Other (See Comments)   Right side of body started shaking    Vancomycin Rash  Medication List        Accurate as of November 12, 2021  8:49 AM. If you have any questions, ask your nurse or doctor.          Carbatrol 300 MG 12 hr capsule Generic drug: carbamazepine Take 1 capsule (300 mg total) by mouth 2 (two) times daily.   cetirizine HCl 5 MG/5ML Soln Commonly known as: Zyrtec Take 10 mLs (10 mg total) by mouth daily.   Depakote Sprinkles 125 MG capsule Generic drug: divalproex TAKE 5 CAPSULE BY MOUTH TWICE DAILY   diazepam 1 MG/ML solution Commonly known as: VALIUM Give 1 ml at bedtime and give 83ml by mouth up to twice per day as needed for restlessness   diazepam 10 MG Gel Commonly known as: DIASTAT ACUDIAL INSERT 10 MG RECTALLY AT ONSET OF SEIZURE   famotidine 40 MG tablet Commonly known as: PEPCID Take 40 mg by mouth daily.   famotidine 40 MG/5ML suspension Commonly known as: PEPCID SHAKE LIQUID AND TAKE 5 ML(40 MG) BY MOUTH AT BEDTIME      Total time spent with the patient was 15 minutes, of which 50% or more was spent in counseling and coordination of care.  Rockwell Germany  NP-C [facility] Ph. [phone] Fax [phone]

## 2021-11-12 NOTE — Progress Notes (Deleted)
Theodore Lewis   MRN:  [id number]  April 24, 1996   Provider: Rockwell Germany NP-C Location of Care: [facility]  Visit type: Virtual visit Last visit: 05/21/2021 Referral source: Scarlette Calico, MD History from: guardian, [facility] Chart  This is a Pediatric Specialist E-Visit follow up consult provided via [place] and their parent/guardian Theodore Lewis consented to an E-Visit consult today.  Location of patient: Theodore Lewis is at home  Location of provider: , is at Pediatric Specialist office Patient was referred by Theodore Lima, MD   The following participants were involved in this E-Visit: Theodore Lewis, RMA Theodore Germany, NP Theodore Lewis- patient Sharilyn Sites- guardian This visit was done via [place] Complain/ Reason for E-Visit today: Seizures Total time on call: *** Follow up: ***   Brief history:  Copied from previous record:   Today's concerns:  *** has been otherwise generally healthy since he was last seen. Neither *** nor mother have other health concerns for *** today other than previously mentioned.   Review of systems: Please see HPI for neurologic and other pertinent review of systems. Otherwise all other systems were reviewed and were negative.  Problem List: Patient Active Problem List   Diagnosis Date Noted   Thrombocytopenia ([place]) 08/02/2021   Seasonal allergic rhinitis due to pollen 08/02/2021   Cachexia ([place]) 08/02/2021   Loss of weight 09/22/2020   Oppositional behavior 09/22/2020   Restlessness and agitation 04/20/2015   Hamstring tightness of both lower extremities 04/29/2014   Moderate intellectual disabilities 02/24/2013   History of congenital brain abnormality 02/24/2013   Obstructive hydrocephalus ([place]) 02/24/2013   Congenital quadriplegia ([place]) 02/24/2013   Generalized convulsive epilepsy ([place]) 02/24/2013   Partial epilepsy with impairment of consciousness ([place]) 02/24/2013     Past Medical  History:  Diagnosis Date   Anxiety    Cerebral palsy ([place])    Congenital quadriparesis ([facility])    Neuromuscular disorder ([place])    Seizure ([place])    Seizures ([place])    last seizure 3 yrs ago    Past medical history comments: See HPI Copied from previous record:   Surgical history: Past Surgical History:  Procedure Laterality Date   BRAIN SURGERY     Shunt placed when he was a yr. old   I & D EXTREMITY Bilateral 04/29/2014   Procedure: IRRIGATION AND DEBRIDEMENT EXTREMITY;  Surgeon: Marybelle Killings, MD;  Location: [facility];  Service: Orthopedics;  Laterality: Bilateral;  Right and Left Hamstring Lenghening and Fiberglass Casting   LEG TENDON SURGERY Bilateral approx 6 yrs ago     Family history: family history includes Cancer in his paternal grandfather and paternal grandmother; Diabetes in his father and mother; Early death in his paternal grandfather and paternal grandmother; Healthy in his mother; Heart disease in his father, maternal grandfather, and mother; Hyperlipidemia in his father, maternal grandfather, and mother; Hypertension in his father, maternal grandfather, and maternal grandmother; Intellectual disability in his maternal grandfather; Learning disabilities in his father; Stomach cancer in his paternal grandfather and paternal grandmother; Throat cancer in his paternal grandfather.   Social history: Social History   Socioeconomic History   Marital status: Single    Spouse name: Not on file   Number of children: Not on file   Years of education: Not on file   Highest education level: Not on file  Occupational History   Not on file  Tobacco Use   Smoking status: Never   Smokeless tobacco:  Never  Substance and Sexual Activity   Alcohol use: No   Drug use: No   Sexual activity: Never  Other Topics Concern   Not on file  Social History Narrative   Not on file   Social Determinants of Health   Financial Resource Strain: Not on file  Food Insecurity: Not on file   Transportation Needs: Not on file  Physical Activity: Not on file  Stress: Not on file  Social Connections: Not on file  Intimate Partner Violence: Not on file      Past/failed meds: Copied from previous record:  Allergies: Allergies  Allergen Reactions   Penicillins Other (See Comments)    Right side of body started shaking     Vancomycin Rash      Immunizations: Immunization History  Administered Date(s) Administered   Influenza-Unspecified 07/24/2021   PFIZER(Purple Top)SARS-COV-2 Vaccination 03/13/2020, 04/04/2020, 10/05/2020      Diagnostics/Screenings: Copied from previous record:   Physical Exam: Wt 70 lb (31.8 kg)    BMI 13.67 kg/m     Impression: No diagnosis found.    Recommendations for plan of care: The patient's previous [facility] records were reviewed. *** has neither had nor required imaging or lab studies since the last visit.   The medication list was reviewed and reconciled. No changes were made in the prescribed medications today. A complete medication list was provided to the patient.  No orders of the defined types were placed in this encounter.   No follow-ups on file.   Allergies as of 11/12/2021       Reactions   Penicillins Other (See Comments)   Right side of body started shaking    Vancomycin Rash        Medication List        Accurate as of November 12, 2021  8:42 AM. If you have any questions, ask your nurse or doctor.          Carbatrol 300 MG 12 hr capsule Generic drug: carbamazepine Take 1 capsule (300 mg total) by mouth 2 (two) times daily.   cetirizine HCl 5 MG/5ML Soln Commonly known as: Zyrtec Take 10 mLs (10 mg total) by mouth daily.   Depakote Sprinkles 125 MG capsule Generic drug: divalproex TAKE 5 CAPSULE BY MOUTH TWICE DAILY   diazepam 1 MG/ML solution Commonly known as: VALIUM Give 1 ml at bedtime and give 36ml by mouth up to twice per day as needed for restlessness   diazepam 10 MG  Gel Commonly known as: DIASTAT ACUDIAL INSERT 10 MG RECTALLY AT ONSET OF SEIZURE   famotidine 40 MG tablet Commonly known as: PEPCID Take 40 mg by mouth daily.   famotidine 40 MG/5ML suspension Commonly known as: PEPCID SHAKE LIQUID AND TAKE 5 ML(40 MG) BY MOUTH AT BEDTIME              Total time spent with the patient was *** minutes, of which 50% or more was spent in counseling and coordination of care.  Theodore Germany NP-C [facility] Ph. [phone] Fax [phone]

## 2021-11-12 NOTE — Patient Instructions (Signed)
It was a pleasure to see you today!  Instructions for you until your next appointment are as follows: Continue giving Jahiem's medications as prescribed I will refer Vernice to physical therapy as we discussed Please sign up for MyChart if you have not done so. Please plan to return for follow up in 6 months or sooner if needed.    Feel free to contact our office during normal business hours at [phone] with questions or concerns. If there is no answer or the call is outside business hours, please leave a message and our clinic staff will call you back within the next business day.  If you have an urgent concern, please stay on the line for our after-hours answering service and ask for the on-call neurologist.     I also encourage you to use MyChart to communicate with me more directly. If you have not yet signed up for MyChart within [facility], the front desk staff can help you. However, please note that this inbox is NOT monitored on nights or weekends, and response can take up to 2 business days.  Urgent matters should be discussed with the on-call pediatric neurologist.   At [facility], we are committed to providing exceptional care. You will receive a patient satisfaction survey through text or email regarding your visit today. Your opinion is important to me. Comments are appreciated.

## 2021-11-13 ENCOUNTER — Encounter (INDEPENDENT_AMBULATORY_CARE_PROVIDER_SITE_OTHER): Payer: Self-pay | Admitting: Family

## 2021-11-21 ENCOUNTER — Telehealth (INDEPENDENT_AMBULATORY_CARE_PROVIDER_SITE_OTHER): Payer: Self-pay | Admitting: Family

## 2021-11-21 NOTE — Telephone Encounter (Signed)
°  Who's calling (name and relationship to patient) : De Nurse contact number:  [phone]    Provider they see: Inetta Fermo   Reason for call: Received a referral for home health services and unable to take his insurance at this time     PRESCRIPTION REFILL ONLY  Name of prescription:  Pharmacy:

## 2021-11-21 NOTE — Telephone Encounter (Signed)
Information added to his referral. Will send to another facility to see if they are able to accept his referral.

## 2022-02-01 DIAGNOSIS — G809 Cerebral palsy, unspecified: Secondary | ICD-10-CM | POA: Diagnosis not present

## 2022-02-05 ENCOUNTER — Other Ambulatory Visit (INDEPENDENT_AMBULATORY_CARE_PROVIDER_SITE_OTHER): Payer: Self-pay

## 2022-02-05 ENCOUNTER — Telehealth (INDEPENDENT_AMBULATORY_CARE_PROVIDER_SITE_OTHER): Payer: Self-pay | Admitting: Family

## 2022-02-05 DIAGNOSIS — G40209 Localization-related (focal) (partial) symptomatic epilepsy and epileptic syndromes with complex partial seizures, not intractable, without status epilepticus: Secondary | ICD-10-CM

## 2022-02-05 DIAGNOSIS — G40309 Generalized idiopathic epilepsy and epileptic syndromes, not intractable, without status epilepticus: Secondary | ICD-10-CM

## 2022-02-05 MED ORDER — CARBATROL 300 MG PO CP12: 300.0000 mg | ORAL_CAPSULE | Freq: Two times a day (BID) | ORAL | 5 refills | Status: DC

## 2022-02-05 NOTE — Telephone Encounter (Signed)
?  Name of who is calling: ?Remo Lipps ?Caller's Relationship to Patient: ?Mom  ?Best contact number: ?[phone] ?Provider they see: ?[facility] ?Reason for call: ? ?At one point family was receiving Depakote and carbatrol every 30 days. Now the pharmacy has them on a different schedule. Please contact mom to see if we can advise  ? ? ?PRESCRIPTION REFILL ONLY ? ?Name of prescription: ? ?Pharmacy: ? ? ?

## 2022-02-06 ENCOUNTER — Other Ambulatory Visit (INDEPENDENT_AMBULATORY_CARE_PROVIDER_SITE_OTHER): Payer: Self-pay

## 2022-02-06 DIAGNOSIS — G40209 Localization-related (focal) (partial) symptomatic epilepsy and epileptic syndromes with complex partial seizures, not intractable, without status epilepticus: Secondary | ICD-10-CM

## 2022-02-06 DIAGNOSIS — G40309 Generalized idiopathic epilepsy and epileptic syndromes, not intractable, without status epilepticus: Secondary | ICD-10-CM

## 2022-02-06 MED ORDER — DEPAKOTE SPRINKLES 125 MG PO CSDR: DELAYED_RELEASE_CAPSULE | ORAL | 5 refills | Status: DC

## 2022-02-11 NOTE — Telephone Encounter (Signed)
I have been unable to reach Mom by phone. I will await her call. TG ?

## 2022-02-19 ENCOUNTER — Telehealth (INDEPENDENT_AMBULATORY_CARE_PROVIDER_SITE_OTHER): Payer: Self-pay | Admitting: Family

## 2022-02-19 NOTE — Telephone Encounter (Signed)
I returned the call to [facility]. She said that Mom was requesting in home physical therapy for Theodore Lewis. I explained that I do not know of any agencies doing in home PT at this time. I am happy to refer him for outpatient PT if Mom wants to take him for that. She will let Mom know. TG ?

## 2022-02-19 NOTE — Telephone Encounter (Signed)
[facility] faxed a release of record to the office for completion. Afterwards Theodore Lewis contacted the office to discuss a referral to there office that was requested by patient. Please contact family to follow up and send referral at patient request. The release has been completed and sent to the [facility] office.  ?

## 2022-02-19 NOTE — Telephone Encounter (Signed)
?  Name of who is calling:Jennifer  ? ?Caller's Relationship to Patient:[place]  ? ?Best contact [phone] ? ?Provider they TIW:[facility]  ? ?Reason for call:Jennifer with [facility] called back to follow up on the referral that the guardian was requesting for PT  ? ? ? ? ?PRESCRIPTION REFILL ONLY ? ?Name of prescription: ? ?Pharmacy: ? ? ?

## 2022-02-21 ENCOUNTER — Telehealth (INDEPENDENT_AMBULATORY_CARE_PROVIDER_SITE_OTHER): Payer: Self-pay | Admitting: Family

## 2022-02-21 NOTE — Telephone Encounter (Signed)
?  Name of who is calling: Victorino Dike ? ?Caller's Relationship to Patient: ? ?Best contact number:[phone] ? ?Provider they see: Elveria Rising ? ?Reason for call: Guardian will like  and out patient referral for outpatient physical therpy ? ? ? ? ?PRESCRIPTION REFILL ONLY ? ?Name of prescription: ? ?Pharmacy: ? ? ?

## 2022-02-21 NOTE — Telephone Encounter (Signed)
Left message for Theodore Lewis to call back with where she would like the referral for PT sent. Asked if [facility] on [address] would work.  ?

## 2022-04-02 ENCOUNTER — Ambulatory Visit (INDEPENDENT_AMBULATORY_CARE_PROVIDER_SITE_OTHER): Payer: Medicare Other | Admitting: Internal Medicine

## 2022-04-02 VITALS — BP 116/70 | HR 82 | Temp 97.4°F | Resp 16 | Ht 60.0 in | Wt <= 1120 oz

## 2022-04-02 DIAGNOSIS — K219 Gastro-esophageal reflux disease without esophagitis: Secondary | ICD-10-CM

## 2022-04-02 DIAGNOSIS — D696 Thrombocytopenia, unspecified: Secondary | ICD-10-CM

## 2022-04-02 LAB — CBC WITH DIFFERENTIAL/PLATELET
Basophils Absolute: 0 10*3/uL (ref 0.0–0.1)
Basophils Relative: 0.2 % (ref 0.0–3.0)
Eosinophils Absolute: 0 10*3/uL (ref 0.0–0.7)
Eosinophils Relative: 0.3 % (ref 0.0–5.0)
HCT: 40.3 % (ref 39.0–52.0)
Hemoglobin: 13.9 g/dL (ref 13.0–17.0)
Lymphocytes Relative: 30.3 % (ref 12.0–46.0)
Lymphs Abs: 1.1 10*3/uL (ref 0.7–4.0)
MCHC: 34.5 g/dL (ref 30.0–36.0)
MCV: 102.7 fl — ABNORMAL HIGH (ref 78.0–100.0)
Monocytes Absolute: 0.4 10*3/uL (ref 0.1–1.0)
Monocytes Relative: 11.5 % (ref 3.0–12.0)
Neutro Abs: 2.1 10*3/uL (ref 1.4–7.7)
Neutrophils Relative %: 57.7 % (ref 43.0–77.0)
Platelets: 73 10*3/uL — ABNORMAL LOW (ref 150.0–400.0)
RBC: 3.93 Mil/uL — ABNORMAL LOW (ref 4.22–5.81)
RDW: 15.1 % (ref 11.5–15.5)
WBC: 3.6 10*3/uL — ABNORMAL LOW (ref 4.0–10.5)

## 2022-04-02 MED ORDER — FAMOTIDINE 40 MG/5ML PO SUSR: 40.0000 mg | Freq: Every day | ORAL | 2 refills | Status: DC

## 2022-04-02 NOTE — Progress Notes (Unsigned)
Subjective:  Patient ID: Theodore Lewis, male    DOB: 1996/07/18  Age: 26 y.o. MRN: [id number]  CC: Follow-up   HPI Theodore Lewis presents for f/up -  He is with his mother today. There is no hx of bleeding or bruising. He needs a form completed for an upcoming dental exam.  Outpatient Medications Prior to Visit  Medication Sig Dispense Refill   CARBATROL 300 MG 12 hr capsule Take 1 capsule (300 mg total) by mouth 2 (two) times daily. 62 capsule 5   cetirizine HCl (ZYRTEC) 5 MG/5ML SOLN Take 10 mLs (10 mg total) by mouth daily. 473 mL 1   DEPAKOTE SPRINKLES 125 MG capsule TAKE 5 CAPSULE BY MOUTH TWICE DAILY 310 capsule 5   diazepam (DIASTAT ACUDIAL) 10 MG GEL INSERT 10 MG RECTALLY AT ONSET OF SEIZURE 1 each 5   diazepam (VALIUM) 1 MG/ML solution Give 1 ml at bedtime and give 47ml by mouth up to twice per day as needed for restlessness 90 mL 5   famotidine (PEPCID) 40 MG tablet Take 40 mg by mouth daily.     famotidine (PEPCID) 40 MG/5ML suspension SHAKE LIQUID AND TAKE 5 ML(40 MG) BY MOUTH AT BEDTIME 150 mL 2   No facility-administered medications prior to visit.    ROS Review of Systems  Objective:  BP 116/70 (BP Location: Right Arm, Patient Position: Sitting, Cuff Size: Normal)   Pulse 82   Temp (!) 97.4 F (36.3 C) (Oral)   Resp 16   Ht 5' (1.524 m)   Wt 62 lb (28.1 kg)   SpO2 95%   BMI 12.11 kg/m   BP Readings from Last 3 Encounters:  04/02/22 116/70  08/02/21 122/72  03/12/21 100/75    Wt Readings from Last 3 Encounters:  04/02/22 62 lb (28.1 kg)  11/12/21 70 lb (31.8 kg)  08/02/21 60 lb (27.2 kg)    Physical Exam  Lab Results  Component Value Date   WBC 3.6 (L) 04/02/2022   HGB 13.9 04/02/2022   HCT 40.3 04/02/2022   PLT 73.0 (L) 04/02/2022   GLUCOSE 108 (H) 08/02/2021   ALT 32 08/02/2021   AST 32 08/02/2021   NA 138 08/02/2021   K 4.3 08/02/2021   CL 101 08/02/2021   CREATININE 0.57 08/02/2021   BUN 13 08/02/2021   CO2 28 08/02/2021     DG Neck Soft Tissue  Result Date: 03/12/2021 CLINICAL DATA:  26 year old male shunt discontinuity in the chest. EXAM: NECK SOFT TISSUES - 1+ VIEW COMPARISON:  Chest radiograph today. FINDINGS: Left side shunt descending in the neck appears intact until the upper chest with discontinuity again noted at the anterior 1st rib level. Incomplete visualization of the intracranial portion and reservoir. No osseous abnormality identified. IMPRESSION: Solitary area of shunt catheter discontinuity at the anterior 1st rib. Electronically Signed   By: Odessa Fleming M.D.   On: 03/12/2021 07:41   DG Chest 1 View  Result Date: 03/12/2021 CLINICAL DATA:  27 year old male with persistent cough, dark stools. Cerebral palsy. EXAM: CHEST  1 VIEW COMPARISON:  Abdominal radiographs today reported separately. Portable chest 05/23/2017 and earlier. FINDINGS: Supine AP view at 0534 hours. Normal cardiac size and mediastinal contours. Visualized tracheal air column is within normal limits. Lung volumes are normal and both lungs are clear. No acute osseous abnormality identified. Chronic left side CSF shunt catheter has a new discontinuity at the level of the anterior 1st rib measuring about 8 mm. The remaining  visible catheter tubing appears stable. IMPRESSION: 1. Chronic left side CSF shunt catheter has fractured and is now discontinuous at the level of the anterior 1st rib. 2. No cardiopulmonary abnormality. Electronically Signed   By: Odessa Fleming M.D.   On: 03/12/2021 05:47   DG Abdomen 1 View  Result Date: 03/12/2021 CLINICAL DATA:  26 year old male with persistent cough, dark stools. Cerebral palsy. EXAM: ABDOMEN - 1 VIEW COMPARISON:  CT Abdomen and Pelvis 05/23/2017 and earlier. FINDINGS: Supine views of the abdomen and pelvis at 0536 hours. Chronic CSF shunt catheter coursing from the chest into the abdomen with no adverse features. Normal lung bases. Non obstructed bowel gas pattern. Abdominal and pelvic visceral contours are  within normal limits. Gracile osseous structures with chronically dislocated right hip. IMPRESSION: 1. Normal bowel gas pattern. 2. Chronic CSF shunt catheter with no adverse features. 3. Chronic osseous changes related to cerebral palsy. Electronically Signed   By: Odessa Fleming M.D.   On: 03/12/2021 05:46   CT Head Wo Contrast  Result Date: 03/12/2021 CLINICAL DATA:  26 year old male with history of CSF shunt, cerebral palsy. Shunt discontinuity in the upper chest new since 2018. EXAM: CT HEAD WITHOUT CONTRAST TECHNIQUE: Contiguous axial images were obtained from the base of the skull through the vertex without intravenous contrast. COMPARISON:  Head CT 05/23/2017 and earlier. FINDINGS: Brain: Chronic rim calcified oval mass in the right hemisphere measures up to 12 cm in length on sagittal images (series 8, image 21 today versus series 5, image 34 previously) and appears stable and size and configuration. The mixed internal density of the lesion appears stable. No acute intracranial mass effect. Basilar cisterns remain patent. Superimposed dysplastic brain with corpus callosum dysgenesis, dysplastic ventricles, posterior left hemisphere and brainstem/cerebellar dysgenesis or atrophy. The intracranial portion of the CSF shunt appears stable. There is no ventriculomegaly. No acute intracranial hemorrhage identified. No cortically based acute infarct identified. Vascular: No suspicious intracranial vascular hyperdensity. Skull: Stable hyperostosis. No acute osseous abnormality identified. Sinuses/Orbits: Visualized paranasal sinuses and mastoids are stable and well aerated. Other: No acute orbit or scalp soft tissue finding. The reservoir and tubing appear intact over the superior left convexity. However at the level of the left mastoids there is a focal discontinuity of the tubing over a segment of 7 mm (series 5, image 54). IMPRESSION: 1. Outside of the chest, a second new discontinuity of the shunt tubing is  identified overlying the left mastoid. 2. But there is no ventriculomegaly. And the non contrast CT appearance of the brain is stable since 2018. Electronically Signed   By: Odessa Fleming M.D.   On: 03/12/2021 10:25    Assessment & Plan:   Zymire was seen today for follow-up.  Diagnoses and all orders for this visit:  Thrombocytopenia (HCC) -     CBC with Differential/Platelet; Future -     CBC with Differential/Platelet  Gastroesophageal reflux disease without esophagitis -     famotidine (PEPCID) 40 MG/5ML suspension; Take 5 mLs (40 mg total) by mouth at bedtime.   I have changed Alric Seton. Dehart's famotidine. I am also having him maintain his diazepam, famotidine, diazepam, cetirizine HCl, Carbatrol, and Depakote Sprinkles.  Meds ordered this encounter  Medications   famotidine (PEPCID) 40 MG/5ML suspension    Sig: Take 5 mLs (40 mg total) by mouth at bedtime.    Dispense:  150 mL    Refill:  2     Follow-up: No follow-ups on file.  Sanda Linger,  MD

## 2022-04-03 ENCOUNTER — Encounter: Payer: Self-pay | Admitting: Internal Medicine

## 2022-04-08 ENCOUNTER — Telehealth: Payer: Self-pay | Admitting: Internal Medicine

## 2022-04-08 NOTE — Telephone Encounter (Signed)
I have spoken to pt's mother, Theodore Lewis, she has been informed of notation from 04/03/22 letter from PCP stating, "His platelet count is still low but it is stable. Let us know if there is any bleeding/bruising."  Theodore Lewis has the below questions:  Does [name] would need to see a specialist in regard to the low platelet count?   Is anything that pt's parents need to do to help bring his count up as far as diet? If not diet, additional recommendations?  Does pt still need to proceed with upcoming dental procedure of r/s since the platelets are low?  Please advise.

## 2022-04-08 NOTE — Telephone Encounter (Signed)
Patients mom has questions concerning son's platelet count.

## 2022-04-09 NOTE — Telephone Encounter (Signed)
Pt's mother, Remo Lipps, has been informed of below responses and expressed understanding.

## 2022-04-15 ENCOUNTER — Telehealth: Payer: Self-pay | Admitting: Internal Medicine

## 2022-04-15 ENCOUNTER — Other Ambulatory Visit: Payer: Self-pay | Admitting: Internal Medicine

## 2022-04-15 DIAGNOSIS — D696 Thrombocytopenia, unspecified: Secondary | ICD-10-CM

## 2022-04-15 NOTE — Telephone Encounter (Signed)
Patient's mom would like to know what can be done about her son's

## 2022-04-15 NOTE — Telephone Encounter (Signed)
Patients mother states that his dental procedure has been cancelled due to his blood count - should she still just do what was recommended previously or should he be sent to a specialist - please advise.

## 2022-04-16 ENCOUNTER — Telehealth: Payer: Self-pay | Admitting: Oncology

## 2022-04-16 NOTE — Telephone Encounter (Signed)
Scheduled appt per 6/12 referral. Pt's mother is aware of appt date and time. Pt's mother is aware to arrive 15 mins prior to appt time and to bring and updated insurance card. Pt's mother is aware of appt location.

## 2022-04-16 NOTE — Telephone Encounter (Signed)
Pt's mother, [name], has been informed.

## 2022-04-22 ENCOUNTER — Telehealth: Payer: Self-pay | Admitting: Internal Medicine

## 2022-04-22 NOTE — Telephone Encounter (Signed)
Pt mother requested an updated medical  history  be sent to [facility] for pts. Procedure. Caller was unable to provide fax number at this time.

## 2022-04-22 NOTE — Telephone Encounter (Signed)
Noted. Will wait for fax number to be provided.

## 2022-05-03 ENCOUNTER — Inpatient Hospital Stay: Payer: Medicare Other | Attending: Oncology | Admitting: Oncology

## 2022-05-03 ENCOUNTER — Telehealth: Payer: Self-pay

## 2022-05-03 ENCOUNTER — Other Ambulatory Visit: Payer: Self-pay

## 2022-05-03 ENCOUNTER — Inpatient Hospital Stay: Payer: Medicare Other

## 2022-05-03 VITALS — BP 122/82 | HR 81 | Resp 16 | Ht 60.0 in | Wt 74.0 lb

## 2022-05-03 DIAGNOSIS — D696 Thrombocytopenia, unspecified: Secondary | ICD-10-CM | POA: Diagnosis not present

## 2022-05-03 DIAGNOSIS — G809 Cerebral palsy, unspecified: Secondary | ICD-10-CM | POA: Insufficient documentation

## 2022-05-03 LAB — CBC WITH DIFFERENTIAL (CANCER CENTER ONLY)
Abs Immature Granulocytes: 0.01 10*3/uL (ref 0.00–0.07)
Basophils Absolute: 0 10*3/uL (ref 0.0–0.1)
Basophils Relative: 0 %
Eosinophils Absolute: 0.1 10*3/uL (ref 0.0–0.5)
Eosinophils Relative: 3 %
HCT: 39.5 % (ref 39.0–52.0)
Hemoglobin: 13.8 g/dL (ref 13.0–17.0)
Immature Granulocytes: 0 %
Lymphocytes Relative: 22 %
Lymphs Abs: 1 10*3/uL (ref 0.7–4.0)
MCH: 35 pg — ABNORMAL HIGH (ref 26.0–34.0)
MCHC: 34.9 g/dL (ref 30.0–36.0)
MCV: 100.3 fL — ABNORMAL HIGH (ref 80.0–100.0)
Monocytes Absolute: 0.6 10*3/uL (ref 0.1–1.0)
Monocytes Relative: 14 %
Neutro Abs: 2.7 10*3/uL (ref 1.7–7.7)
Neutrophils Relative %: 61 %
Platelet Count: 81 10*3/uL — ABNORMAL LOW (ref 150–400)
RBC: 3.94 MIL/uL — ABNORMAL LOW (ref 4.22–5.81)
RDW: 14.6 % (ref 11.5–15.5)
Smear Review: NORMAL
WBC Count: 4.4 10*3/uL (ref 4.0–10.5)
nRBC: 0 % (ref 0.0–0.2)

## 2022-05-03 NOTE — Telephone Encounter (Signed)
Dental Clearance letter from Dr. Clelia Croft as well as Pre-Operative Physical Examination Form, completed by Dr. Sanda Linger, faxed to [facility] @ [phone]. Completed paperwork sent for scanning. Copy of Dr. Alver Fisher letter routed to Dr. Yetta Barre.

## 2022-05-03 NOTE — Progress Notes (Signed)
Reason for the request:    Thrombocytopenia  HPI: I was asked by Dr. Yetta Barre to evaluate Theodore Lewis for the evaluation of thrombocytopenia.  He is a 26 year old with cerebral palsy and neuromuscular disorder was found to have thrombocytopenia.  CBC obtained on Apr 02, 2022 showed a platelet count of 73, white cell count of 3.3 and a hemoglobin of 13.9.  Previous CBC in September 2022 showed a platelet count of 84 he has normal white cell count differential and red cell indices.  Previous CBCs showed fluctuating platelet count as low as 88 in 2015 with spontaneous recovery to 171 and July 2017.  It was down to 128 in 2022.  CBC in 2020 showed a platelet count of 102 and it was 92 in 2019.  Clinically, the patient is nonverbal and unable to provide any specific complaints.  Per his family, there is no bruising or bleeding complications.   He does not report any headaches, blurry vision, syncope or seizures. Does not report any fevers, chills or sweats.  Does not report any cough, wheezing or hemoptysis.  Does not report any chest pain, palpitation, orthopnea or leg edema.  Does not report any nausea, vomiting or abdominal pain.  Does not report any constipation or diarrhea.  Does not report any skeletal complaints.    Does not report frequency, urgency or hematuria.  Does not report any skin rashes or lesions. Does not report any heat or cold intolerance.  Does not report any lymphadenopathy or petechiae.  Does not report any anxiety or depression.  Remaining review of systems is negative.     Past Medical History:  Diagnosis Date   Anxiety    Cerebral palsy (HCC)    Congenital quadriparesis (HCC)    Neuromuscular disorder (HCC)    Seizure (HCC)    Seizures (HCC)    last seizure 3 yrs ago  :   Past Surgical History:  Procedure Laterality Date   BRAIN SURGERY     Shunt placed when he was a yr. old   I & D EXTREMITY Bilateral 04/29/2014   Procedure: IRRIGATION AND DEBRIDEMENT EXTREMITY;   Surgeon: Eldred Manges, MD;  Location: [facility] OR;  Service: Orthopedics;  Laterality: Bilateral;  Right and Left Hamstring Lenghening and Fiberglass Casting   LEG TENDON SURGERY Bilateral approx 6 yrs ago  :   Current Outpatient Medications:    CARBATROL 300 MG 12 hr capsule, Take 1 capsule (300 mg total) by mouth 2 (two) times daily., Disp: 62 capsule, Rfl: 5   cetirizine HCl (ZYRTEC) 5 MG/5ML SOLN, Take 10 mLs (10 mg total) by mouth daily., Disp: 473 mL, Rfl: 1   DEPAKOTE SPRINKLES 125 MG capsule, TAKE 5 CAPSULE BY MOUTH TWICE DAILY, Disp: 310 capsule, Rfl: 5   diazepam (DIASTAT ACUDIAL) 10 MG GEL, INSERT 10 MG RECTALLY AT ONSET OF SEIZURE, Disp: 1 each, Rfl: 5   diazepam (VALIUM) 1 MG/ML solution, Give 1 ml at bedtime and give 49ml by mouth up to twice per day as needed for restlessness, Disp: 90 mL, Rfl: 5   famotidine (PEPCID) 40 MG tablet, Take 40 mg by mouth daily., Disp: , Rfl:    famotidine (PEPCID) 40 MG/5ML suspension, Take 5 mLs (40 mg total) by mouth at bedtime., Disp: 150 mL, Rfl: 2:   Allergies  Allergen Reactions   Penicillins Other (See Comments)    Right side of body started shaking     Vancomycin Rash  :   Family History  Problem  Relation Age of Onset   Hyperlipidemia Mother    Heart disease Mother    Diabetes Mother    Healthy Mother    Learning disabilities Father    Hypertension Father    Hyperlipidemia Father    Heart disease Father    Diabetes Father    Hypertension Maternal Grandmother    Intellectual disability Maternal Grandfather    Hypertension Maternal Grandfather    Hyperlipidemia Maternal Grandfather    Heart disease Maternal Grandfather    Cancer Paternal Grandmother    Early death Paternal Grandmother    Stomach cancer Paternal Grandmother        Died at 71   Cancer Paternal Grandfather    Early death Paternal Grandfather    Throat cancer Paternal Grandfather        Died at 43   Stomach cancer Paternal Grandfather   :   Social History    Socioeconomic History   Marital status: Single    Spouse name: Not on file   Number of children: Not on file   Years of education: Not on file   Highest education level: Not on file  Occupational History   Not on file  Tobacco Use   Smoking status: Never    Passive exposure: Never   Smokeless tobacco: Never  Substance and Sexual Activity   Alcohol use: No   Drug use: No   Sexual activity: Never  Other Topics Concern   Not on file  Social History Narrative   Not on file   Social Determinants of Health   Financial Resource Strain: Not on file  Food Insecurity: Not on file  Transportation Needs: Not on file  Physical Activity: Not on file  Stress: Not on file  Social Connections: Not on file  Intimate Partner Violence: Not on file  :  Pertinent items are noted in HPI.  Exam: Blood pressure 122/82, pulse 81, resp. rate 16, height 5' (1.524 m), weight 74 lb (33.6 kg).  General appearance: alert and cooperative appeared without distress. Head: atraumatic without any abnormalities. Eyes: conjunctivae/corneas clear. PERRL.  Sclera anicteric. Throat: lips, mucosa, and tongue normal; without oral thrush or ulcers. Resp: clear to auscultation bilaterally without rhonchi, wheezes or dullness to percussion. Cardio: regular rate and rhythm, S1, S2 normal, no murmur, click, rub or gallop GI: soft, non-tender; bowel sounds normal; no masses,  no organomegaly Skin: No ecchymosis or petechiae noted. Lymph nodes: Cervical, supraclavicular, and axillary nodes normal. Neurologic: Grossly normal without any motor, sensory or deep tendon reflexes. Musculoskeletal: No joint deformity or effusion.   Assessment and Plan:    26 year old with:  1.  Thrombocytopenia dating back to 2015 with fluctuating counts between 70 and normal range.  CBC in May 2023 showed a platelet count of 73.  The differential diagnosis of these findings were discussed at this time.  Reactive thrombocytopenia  platelet clumping remains the most likely etiology at this time.  Medication effects could also be a contributing factor.  ITP, HUS, TTP, DIC, MDS are considered unlikely.  From a management standpoint, I recommended continued surveillance at this time.  He does not require any intervention currently.  Close surveillance and a trial of steroids could be considered in the future.  I anticipate spontaneous improvement in his platelets without intervention.  2.  Clearance for dental procedure: I will repeat his CBC today and if his platelet count is above 50 I have no contraindication at this time.   3.  Follow-up: will be  in 3 months for repeat evaluation and recheck of his platelets.   45  minutes were dedicated to this visit. The time was spent on reviewing laboratory data, discussing treatment options, discussing differential diagnosis and answering questions regarding future plan.    A copy of this consult has been forwarded to the requesting physician.

## 2022-05-13 DIAGNOSIS — Z880 Allergy status to penicillin: Secondary | ICD-10-CM | POA: Diagnosis not present

## 2022-05-13 DIAGNOSIS — K0530 Chronic periodontitis, unspecified: Secondary | ICD-10-CM | POA: Diagnosis not present

## 2022-05-13 DIAGNOSIS — K029 Dental caries, unspecified: Secondary | ICD-10-CM | POA: Diagnosis not present

## 2022-05-13 DIAGNOSIS — K056 Periodontal disease, unspecified: Secondary | ICD-10-CM | POA: Diagnosis not present

## 2022-05-13 DIAGNOSIS — Z79899 Other long term (current) drug therapy: Secondary | ICD-10-CM | POA: Diagnosis not present

## 2022-05-13 DIAGNOSIS — K219 Gastro-esophageal reflux disease without esophagitis: Secondary | ICD-10-CM | POA: Diagnosis not present

## 2022-05-13 DIAGNOSIS — Z881 Allergy status to other antibiotic agents status: Secondary | ICD-10-CM | POA: Diagnosis not present

## 2022-05-13 DIAGNOSIS — K0510 Chronic gingivitis, plaque induced: Secondary | ICD-10-CM | POA: Diagnosis not present

## 2022-07-11 ENCOUNTER — Telehealth (INDEPENDENT_AMBULATORY_CARE_PROVIDER_SITE_OTHER): Payer: Self-pay

## 2022-07-11 DIAGNOSIS — G40209 Localization-related (focal) (partial) symptomatic epilepsy and epileptic syndromes with complex partial seizures, not intractable, without status epilepticus: Secondary | ICD-10-CM

## 2022-07-11 DIAGNOSIS — G40309 Generalized idiopathic epilepsy and epileptic syndromes, not intractable, without status epilepticus: Secondary | ICD-10-CM

## 2022-07-11 MED ORDER — DEPAKOTE SPRINKLES 125 MG PO CSDR: DELAYED_RELEASE_CAPSULE | ORAL | 5 refills | Status: DC

## 2022-07-11 NOTE — Telephone Encounter (Signed)
Refill for Depakote Sprinkles 125 mg 11/12/2021 seen next appt 08/08/22

## 2022-08-02 ENCOUNTER — Other Ambulatory Visit: Payer: Self-pay

## 2022-08-02 ENCOUNTER — Inpatient Hospital Stay (HOSPITAL_BASED_OUTPATIENT_CLINIC_OR_DEPARTMENT_OTHER): Payer: Medicare Other | Admitting: Oncology

## 2022-08-02 ENCOUNTER — Inpatient Hospital Stay: Payer: Medicare Other | Attending: Oncology

## 2022-08-02 VITALS — BP 144/99 | HR 76 | Temp 97.2°F | Resp 16

## 2022-08-02 DIAGNOSIS — D696 Thrombocytopenia, unspecified: Secondary | ICD-10-CM | POA: Insufficient documentation

## 2022-08-02 LAB — CBC WITH DIFFERENTIAL (CANCER CENTER ONLY)
Abs Immature Granulocytes: 0.02 10*3/uL (ref 0.00–0.07)
Basophils Absolute: 0 10*3/uL (ref 0.0–0.1)
Basophils Relative: 0 %
Eosinophils Absolute: 0 10*3/uL (ref 0.0–0.5)
Eosinophils Relative: 1 %
HCT: 43 % (ref 39.0–52.0)
Hemoglobin: 14.9 g/dL (ref 13.0–17.0)
Immature Granulocytes: 1 %
Lymphocytes Relative: 31 %
Lymphs Abs: 0.8 10*3/uL (ref 0.7–4.0)
MCH: 33.8 pg (ref 26.0–34.0)
MCHC: 34.7 g/dL (ref 30.0–36.0)
MCV: 97.5 fL (ref 80.0–100.0)
Monocytes Absolute: 0.3 10*3/uL (ref 0.1–1.0)
Monocytes Relative: 11 %
Neutro Abs: 1.4 10*3/uL — ABNORMAL LOW (ref 1.7–7.7)
Neutrophils Relative %: 56 %
Platelet Count: 76 10*3/uL — ABNORMAL LOW (ref 150–400)
RBC: 4.41 MIL/uL (ref 4.22–5.81)
RDW: 12.8 % (ref 11.5–15.5)
WBC Count: 2.5 10*3/uL — ABNORMAL LOW (ref 4.0–10.5)
nRBC: 0 % (ref 0.0–0.2)

## 2022-08-02 NOTE — Progress Notes (Signed)
Hematology and Oncology Follow Up Visit  Theodore Lewis [id number] 03-09-1996 25 y.o. 08/02/2022 9:18 AM Theodore Lewis, MDJones, [name], MD   Principle Diagnosis: 26 year old with thrombocytopenia with fluctuating counts close to 80.  The differential diagnosis include reactive findings versus ITP.       Current therapy: Active surveillance.  Interim History: Theodore Lewis returns today for a follow-up.  Since last visit, no major issues reported.  Theodore Lewis underwent dental clearance and according to Theodore Lewis family Theodore Lewis had no bleeding complications.  Theodore Lewis did have some constipation issues afterwards.  Theodore Lewis denies any hematochezia, melena or hemoptysis.  Theodore Lewis denies any ecchymosis or petechiae.     Medications: I have reviewed the patient's current medications.  Current Outpatient Medications  Medication Sig Dispense Refill   CARBATROL 300 MG 12 hr capsule Take 1 capsule (300 mg total) by mouth 2 (two) times daily. 62 capsule 5   cetirizine HCl (ZYRTEC) 5 MG/5ML SOLN Take 10 mLs (10 mg total) by mouth daily. 473 mL 1   DEPAKOTE SPRINKLES 125 MG capsule TAKE 5 CAPSULE BY MOUTH TWICE DAILY 310 capsule 5   diazepam (DIASTAT ACUDIAL) 10 MG GEL INSERT 10 MG RECTALLY AT ONSET OF SEIZURE 1 each 5   diazepam (VALIUM) 1 MG/ML solution Give 1 ml at bedtime and give 45m by mouth up to twice per day as needed for restlessness 90 mL 5   famotidine (PEPCID) 40 MG tablet Take 40 mg by mouth daily.     famotidine (PEPCID) 40 MG/5ML suspension Take 5 mLs (40 mg total) by mouth at bedtime. 150 mL 2   No current facility-administered medications for this visit.     Allergies:  Allergies  Allergen Reactions   Penicillins Other (See Comments)    Right side of body started shaking     Vancomycin Rash      Physical Exam: Blood pressure (!) 144/99, pulse 76, temperature (!) 97.2 F (36.2 C), temperature source Temporal, resp. rate 16, SpO2 100 %.  ECOG: 3    General appearance: Comfortable appearing  without any discomfort Head: Normocephalic without any trauma Oropharynx: Mucous membranes are moist and pink without any thrush or ulcers. Eyes: Pupils are equal and round reactive to light. Lymph nodes: No cervical, supraclavicular, inguinal or axillary lymphadenopathy.   Heart:regular rate and rhythm.  S1 and S2 without leg edema. Lung: Clear without any rhonchi or wheezes.  No dullness to percussion. Abdomin: Soft, nontender, nondistended with good bowel sounds.  No hepatosplenomegaly. Musculoskeletal: No joint deformity or effusion.  Full range of motion noted. Neurological: No deficits noted on motor, sensory and deep tendon reflex exam. Skin: No petechial rash or dryness.  Appeared moist.      Lab Results: Lab Results  Component Value Date   WBC 4.4 05/03/2022   HGB 13.8 05/03/2022   HCT 39.5 05/03/2022   MCV 100.3 (H) 05/03/2022   PLT 81 (L) 05/03/2022     Chemistry      Component Value Date/Time   NA 138 08/02/2021 1112   K 4.3 08/02/2021 1112   CL 101 08/02/2021 1112   CO2 28 08/02/2021 1112   BUN 13 08/02/2021 1112   CREATININE 0.57 08/02/2021 1112      Component Value Date/Time   CALCIUM 9.5 08/02/2021 1112   ALKPHOS 86 08/02/2021 1112   AST 32 08/02/2021 1112   ALT 32 08/02/2021 1112   BILITOT 0.4 08/02/2021 1112        Impression and Plan:  26 year old with:  1.  Mild fluctuating thrombocytopenia with platelet count close to 80 dating back to 2015.  The differential diagnosis including ITP versus reactive findings.  Laboratory data in the last year were reviewed and showed a platelet count as low as 73 and up to 81 in June 2023.  The differential diagnosis was discussed at this time.  ITP versus reactive findings remain the most likely etiology.  Medication effect as well as chronic sequestration could be a consideration but less likely.  For management standpoint, I recommended continued active surveillance given the chronicity and the mild nature  of Theodore Lewis thrombocytopenia.  I see no need for a bone marrow biopsy at this time unless other cytopenias develop in the interim.         3.  Follow-up: We are happy to reevaluate Theodore Lewis in the future as needed.     20  minutes were spent on this encounter.  The time was dedicated to updating disease status, treatment choices and outlining future plan of care reviewed.     Zola Button, MD 9/29/20239:18 AM

## 2022-08-06 ENCOUNTER — Telehealth: Payer: Self-pay | Admitting: Internal Medicine

## 2022-08-06 NOTE — Telephone Encounter (Signed)
error 

## 2022-08-07 ENCOUNTER — Other Ambulatory Visit (INDEPENDENT_AMBULATORY_CARE_PROVIDER_SITE_OTHER): Payer: Self-pay | Admitting: Family

## 2022-08-07 DIAGNOSIS — G40209 Localization-related (focal) (partial) symptomatic epilepsy and epileptic syndromes with complex partial seizures, not intractable, without status epilepticus: Secondary | ICD-10-CM

## 2022-08-07 DIAGNOSIS — G40309 Generalized idiopathic epilepsy and epileptic syndromes, not intractable, without status epilepticus: Secondary | ICD-10-CM

## 2022-08-07 NOTE — Progress Notes (Signed)
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 This was treated with VP shunt. He has resultant spastic quadriparesis, contractures, poor vision, evidence of an axial collection of mixed type in the right frontal-temporal-parietal region and possible infarction of the right brain with encephalomalacia of the left brain, as well as intermittent episode of agitation. He is taking and tolerating Depakote and Carbatrol for his seizure disorder. He is cared for at home by his parents, but his father has significant health problems that is limiting his ability to provide physical care  Today's concerns: Parents report today that Theodore Lewis has had a brief seizure since his last visit. They say that he is eating less and that sometimes he refuses the bites of foods have have his medications in them. They report that he is very picky about what he will eat and that he seems to be unable or unwilling to swallow some textures.   Theodore Lewis underwent a dental procedure in July to clean and restore some teeth. Mom said that he tolerated it well but that his bowel movements are more firm since then.   Theodore Lewis has been prescribed Famotidine but has not been receiving it because Mom was unsure if it should be given.   Theodore Lewis was found to have low platelet levels and is being followed by hematology for that. He has been otherwise generally healthy since he was last seen. His parents would like for him to have water therapy to give him exercise and have questions about that  today. They have no other health concerns for him today other than previously mentioned.  Review of systems: Please see HPI for neurologic and other pertinent review of systems. Otherwise all other systems were reviewed and were negative.  Problem List: Patient Active Problem List   Diagnosis Date Noted   Dysphagia 08/09/2022   Protein-calorie malnutrition ([name]) 08/09/2022   Picky eater 08/09/2022   Decreased motor strength 11/12/2021   Thrombocytopenia ([place]) 08/02/2021   Seasonal allergic rhinitis due to pollen 08/02/2021   Cachexia ([place]) 08/02/2021   Loss of weight 09/22/2020   Oppositional behavior 09/22/2020   Restlessness and agitation 04/20/2015   Moderate intellectual disabilities 02/24/2013   History of congenital brain abnormality 02/24/2013   Obstructive hydrocephalus ([place]) 02/24/2013   Congenital quadriplegia ([place]) 02/24/2013   Generalized convulsive epilepsy ([place]) 02/24/2013   Partial epilepsy with impairment of consciousness ([place]) 02/24/2013     Past Medical History:  Diagnosis Date   Anxiety    Cerebral palsy ([place])    Congenital quadriparesis ([place])    Neuromuscular disorder ([place])    Seizure ([place])    Seizures ([place])    last seizure 3 yrs ago    Past medical history comments: See HPI Copied from previous record: He presented at few months of age with massive hydrocephalus.  He required urgent placement of a ventriculoperitoneal shunt.  He has experienced revisions in the past.  Brain images in 2007 showed an extra-axial collection of mixed type in the right frontotemporal parietal region with possible infarction in the right brain and significant encephalomalacia of the  left brain.  Ventricles were well decompressed.   A CT scan of the brain and shunt series Mar 04, 2013 showed microcephaly with marked diffuse thickening of the calvarium and marked enlargement of the mastoid sinus bilaterally. Left frontal shunt catheter extends to the region of the third ventricle  and is unchanged. No hydrocephalus. There is cerebellar atrophy bilaterally. 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 Brainstem is small. There is atrophy of the left occipital lobe. Agenesis of the corpus callosum.   Large extra-axial fluid collection on the right measures 4.6 x 11.1 cm. This has low density centrally and the wall has calcified since the prior study. The fluid collection has matured and become better defined since the prior study. There is some mass effect on the right cerebral hemisphere. No midline shift to the left. No acute hemorrhage. Shunt tubing was intact from the ventricles to the abdomen.   Surgical history: Past Surgical History:  Procedure Laterality Date   BRAIN SURGERY     Shunt placed when he was a yr. old   I & D EXTREMITY Bilateral 04/29/2014   Procedure: IRRIGATION AND DEBRIDEMENT EXTREMITY;  Surgeon: Eldred Manges, MD;  Location: [facility] OR;  Service: Orthopedics;  Laterality: Bilateral;  Right and Left Hamstring Lenghening and Fiberglass Casting   LEG TENDON SURGERY Bilateral approx 6 yrs ago     Family history: family history includes Cancer in his paternal grandfather and paternal grandmother; Diabetes in his father and mother; Early death in his paternal grandfather and paternal grandmother; Healthy in his mother; Heart disease in his father, maternal grandfather, and mother; Hyperlipidemia in his father, maternal grandfather, and mother; Hypertension in his father, maternal grandfather, and maternal grandmother; Intellectual disability in his maternal grandfather; Learning disabilities in his father; Stomach cancer in his paternal grandfather and paternal grandmother; Throat cancer in his paternal grandfather.   Social history: Social History   Socioeconomic History   Marital status: Single    Spouse name: Not on file   Number of children: Not on file   Years of education: Not on file   Highest education level: Not on file   Occupational History   Not on file  Tobacco Use   Smoking status: Never    Passive exposure: Never   Smokeless tobacco: Never  Substance and Sexual Activity   Alcohol use: No   Drug use: No   Sexual activity: Never  Other Topics Concern   Not on file  Social History Narrative   Not on file   Social Determinants of Health   Financial Resource Strain: Not on file  Food Insecurity: Not on file  Transportation Needs: Not on file  Physical Activity: Not on file  Stress: Not on file  Social Connections: Not on file  Intimate Partner Violence: Not on file    Past/failed meds:  Allergies: Allergies  Allergen Reactions   Penicillins Other (See Comments)    Right side of body started shaking     Vancomycin Rash     Immunizations: Immunization History  Administered Date(s) Administered   Influenza-Unspecified 07/24/2021   PFIZER(Purple Top)SARS-COV-2 Vaccination 03/13/2020, 04/04/2020, 10/05/2020    Diagnostics/Screenings: Copied from previous record: 03/12/2021 - CT head wo contrast - 1. Outside of the chest, a second new discontinuity of the shunt tubing is identified overlying the left mastoid. 2. But there is no ventriculomegaly. And the non contrast CT appearance of the brain is stable since 2018.   05/23/2017 - CT head  wo contrast - No acute finding. Chronic microcephaly and brain atrophy. Chronic calcified subdural collection on the right, not significantly changed since 03/04/2013. No evidence of shunt malfunction. Sinuses clear  Physical Exam: BP 110/82 (BP Location: Right Arm, Patient Position: Sitting, Cuff Size: Normal)   Ht 5' 1.08" (1.551 m)   Wt 74 lb (33.6 kg)   BMI 13.95 kg/m   Wt Readings from Last 3 Encounters:  08/08/22 74 lb (33.6 kg)  05/03/22 74 lb (33.6 kg)  04/02/22 62 lb (28.1 kg)    General: well developed, well nourished young man, seated in wheelchair, in no evident distress Head: normocephalic and atraumatic. Oropharynx benign. No  dysmorphic features. Neck: supple Cardiovascular: regular rate and rhythm, no murmurs. Respiratory: clear to auscultation bilaterally Abdomen: bowel sounds present all four quadrants, abdomen soft, non-tender, non-distended.  Musculoskeletal: has thin extremities with significant muscle wasting. Has flexion contractures.  Skin: no rashes or neurocutaneous lesions  Neurologic Exam Mental Status: awake and fully alert. Has no language but has some signs.  Impassive face. Resistant to invasions into his space Cranial Nerves: fundoscopic exam - red reflex present.  Unable to fully visualize fundus.  Pupils equal briskly reactive to light.  Turns to localize faces and objects in the periphery. Turns to localize sounds in the periphery. Facial movements are symmetric. Motor: spastic quadriparesis  Sensory: withdrawal x 4 Coordination: unable to adequately assess due to patient's inability to participate in examination. No dysmetria when reaching for objects. Gait and Station: unable to stand and bear weight.   Impression: Generalized convulsive epilepsy (HCC)  Congenital quadriplegia (HCC) - Plan: DG SWALLOW FUNC SPEECH PATH, SLP modified barium swallow  Moderate intellectual disabilities - Plan: DG SWALLOW FUNC SPEECH PATH, SLP modified barium swallow  Loss of weight - Plan: DG SWALLOW FUNC SPEECH PATH, SLP modified barium swallow  Decreased motor strength - Plan: DG SWALLOW FUNC SPEECH PATH, SLP modified barium swallow  Dysphagia, unspecified type - Plan: DG SWALLOW FUNC SPEECH PATH, SLP modified barium swallow  Protein-calorie malnutrition, unspecified severity (HCC) - Plan: DG SWALLOW FUNC SPEECH PATH, SLP modified barium swallow  Partial epilepsy with impairment of consciousness (HCC)  Obstructive hydrocephalus (HCC)  Picky eater - Plan: DG SWALLOW FUNC SPEECH PATH, SLP modified barium swallow   Recommendations for plan of care: The patient's previous Epic records were reviewed.  Mouhamed has neither had nor required imaging studies since the last visit. He has had lab studies and Mom is aware of those results. Tavari continues to have problems with being underweight and his parents feel that he is having trouble swallowing. I recommended a swallow study and explained the procedure for that. Makarios was also seen today by the dietician who gave recommendations for adding calories to his diet.   His parents asked about water therapy for him and I will look into that and let them know.   I will otherwise see Sotirios back in follow up in 3 months or sooner if needed.   The medication list was reviewed and reconciled. No changes were made in the prescribed medications today. A complete medication list was provided to the patient.  Orders Placed This Encounter  Procedures   DG SWALLOW FUNC SPEECH PATH    Standing Status:   Future    Standing Expiration Date:   08/09/2023    Order Specific Question:   Reason for Exam (SYMPTOM  OR DIAGNOSIS REQUIRED)    Answer:   concern for aspiration    Order Specific  Question:   Where should this test be performed?    Answer:   [facility]   SLP modified barium swallow    Standing Status:   Future    Standing Expiration Date:   08/09/2023    Order Specific Question:   Where should this test be performed:    Answer:   [facility]    Order Specific Question:   Please indicate reason for Referral:    Answer:   Concerned about Dysphagia/Aspiration    Order Specific Question:   Patients current diet consistency:    Answer:   Regular    Order Specific Question:   Patients current liquid consistency:    Answer:   Thin (All Liquid Allowed)    Order Specific Question:   Existing signs/symptoms of possible Aspiration/Dysphagia:    Answer:   Coughing up mucus/phlegm    Order Specific Question:   Other risk factors for Dysphagia:    Answer:   Dysarthria/poor oral motor skills    Order Specific Question:   Other risk factors for Dysphagia:    Answer:    Unintentional weight loss    Order Specific Question:   Other risk factors for Dysphagia:    Answer:   Malnutrition or Dehydration    Return in about 3 months (around 11/08/2022).   Allergies as of 08/08/2022       Reactions   Penicillins Other (See Comments)   Right side of body started shaking    Vancomycin Rash        Medication List        Accurate as of August 08, 2022 11:59 PM. If you have any questions, ask your nurse or doctor.          Carbatrol 300 MG 12 hr capsule Generic drug: carbamazepine TAKE 1 CAPSULE BY MOUTH TWICE DAILY   cetirizine HCl 5 MG/5ML Soln Commonly known as: Zyrtec Take 10 mLs (10 mg total) by mouth daily.   Depakote Sprinkles 125 MG capsule Generic drug: divalproex TAKE 5 CAPSULES BY MOUTH TWICE DAILY What changed: additional instructions Changed by: Elveria Rising, NP   diazepam 1 MG/ML solution Commonly known as: VALIUM Give 1 ml at bedtime and give 77ml by mouth up to twice per day as needed for restlessness   diazepam 10 MG Gel Commonly known as: DIASTAT ACUDIAL INSERT 10 MG RECTALLY AT ONSET OF SEIZURE   famotidine 40 MG tablet Commonly known as: PEPCID Take 40 mg by mouth daily.   famotidine 40 MG/5ML suspension Commonly known as: PEPCID Take 5 mLs (40 mg total) by mouth at bedtime.   Nutritional Supplement Plus Liqd 1 Ensure Clear or Boost Breeze given PO daily. Started by: Milana Obey, RD      I discussed this patient's care with the dietician involved in his care today to develop this assessment and plan.  Total time spent with the patient was 35 minutes, of which 50% or more was spent in counseling and coordination of care.  Elveria Rising NP-C [facility] Ph. [phone] Fax [phone]

## 2022-08-08 ENCOUNTER — Ambulatory Visit (INDEPENDENT_AMBULATORY_CARE_PROVIDER_SITE_OTHER): Payer: Medicare Other | Admitting: Dietician

## 2022-08-08 ENCOUNTER — Encounter (INDEPENDENT_AMBULATORY_CARE_PROVIDER_SITE_OTHER): Payer: Self-pay | Admitting: Dietician

## 2022-08-08 ENCOUNTER — Ambulatory Visit (INDEPENDENT_AMBULATORY_CARE_PROVIDER_SITE_OTHER): Payer: Medicare Other | Admitting: Family

## 2022-08-08 ENCOUNTER — Other Ambulatory Visit (INDEPENDENT_AMBULATORY_CARE_PROVIDER_SITE_OTHER): Payer: Self-pay | Admitting: Family

## 2022-08-08 ENCOUNTER — Encounter (INDEPENDENT_AMBULATORY_CARE_PROVIDER_SITE_OTHER): Payer: Self-pay | Admitting: Family

## 2022-08-08 VITALS — BP 110/82 | Ht 61.08 in | Wt 74.0 lb

## 2022-08-08 DIAGNOSIS — F71 Moderate intellectual disabilities: Secondary | ICD-10-CM | POA: Diagnosis not present

## 2022-08-08 DIAGNOSIS — E46 Unspecified protein-calorie malnutrition: Secondary | ICD-10-CM | POA: Diagnosis not present

## 2022-08-08 DIAGNOSIS — R634 Abnormal weight loss: Secondary | ICD-10-CM

## 2022-08-08 DIAGNOSIS — R636 Underweight: Secondary | ICD-10-CM

## 2022-08-08 DIAGNOSIS — G808 Other cerebral palsy: Secondary | ICD-10-CM

## 2022-08-08 DIAGNOSIS — G40309 Generalized idiopathic epilepsy and epileptic syndromes, not intractable, without status epilepticus: Secondary | ICD-10-CM | POA: Diagnosis not present

## 2022-08-08 DIAGNOSIS — R638 Other symptoms and signs concerning food and fluid intake: Secondary | ICD-10-CM

## 2022-08-08 DIAGNOSIS — G911 Obstructive hydrocephalus: Secondary | ICD-10-CM | POA: Diagnosis not present

## 2022-08-08 DIAGNOSIS — M6281 Muscle weakness (generalized): Secondary | ICD-10-CM | POA: Diagnosis not present

## 2022-08-08 DIAGNOSIS — R1310 Dysphagia, unspecified: Secondary | ICD-10-CM | POA: Diagnosis not present

## 2022-08-08 DIAGNOSIS — R6339 Other feeding difficulties: Secondary | ICD-10-CM

## 2022-08-08 DIAGNOSIS — G40209 Localization-related (focal) (partial) symptomatic epilepsy and epileptic syndromes with complex partial seizures, not intractable, without status epilepticus: Secondary | ICD-10-CM

## 2022-08-08 MED ORDER — NUTRITIONAL SUPPLEMENT PLUS PO LIQD: ORAL | 12 refills | Status: DC

## 2022-08-08 NOTE — Progress Notes (Signed)
Medical Nutrition Therapy - Progress Note Appt start time: 11:05 AM Appt end time: 11:50 AM Reason for referral: Loss of weight Referring provider: Rockwell Germany, NP - Neuro Pertinent medical hx: obstructive hydrocephalus, congenital quadriplegia, epilepsy, thrombocytopenia, moderate intellectual disabilities, hx of congenital brain abnormality, loss of weight, cachexia, decreased motor strength  Assessment: Food allergies: none known Pertinent Medications: see medication list - pepcid Vitamins/Supplements: none Pertinent labs:  (9/23) CBC: WBC - 2.5 (low), Platelet Count - 76 (low)  (10/5) Anthropometrics: Ht: 155 cm  Wt: 33.6 kg (74 #) BMI: 13.95   IBW based on Hamwi Equation: 50 kg (+/- 10%)  05/03/22 Wt: 74# 04/02/22 Wt: 62# 11/12/21 Wt: 70#  08/02/21 Wt: 60 # 05/21/21 Wt: 74 # 9.6 oz  Estimated minimum caloric needs: 1720 kcal/day (CP non-ambulatory - 11.1 kcal/cm) Estimated minimum protein needs: 0.8 g/kg/day (DRI) Estimated minimum fluid needs: 1720 mL/kg/day (1 mL/kcal)  Primary concerns today: Consult given pt with loss of weight. Pt previously followed by Estanislado Spire, RD. Mom and dad accompanied pt to appt today.   Dietary Intake Hx: DME: [facility] Usual eating pattern includes: 3 meals and 2-3 snacks per day.  Feeding skills: finger feeding, drinking via straw    Preferred foods: chicken nuggets (Wendy's or food lion brand), pudding (chocolate, banana cream, butterscotch), oatmeal with sugar/butter/cream, shrimp, french fries, corn chips, doritos, crispy cream doughnuts Avoided foods: all other foods   Chewing/swallowing difficulties with foods or liquids: occasionally gagging with with foods and liquids, swallowing food whole Texture modifications: chopped, blended   24-hr recall: Breakfast (8 AM): 8 chicken nuggets (cut in half) + 1 cup pudding + soda/water Snack: hashbrown from [employer] (didn't eat it) Lunch (11-12 PM): 8-12 piece chicken nuggets  (Wendy's) + small amount of chips/french fries  Snack: chips Dinner (6:30 PM): 8 chicken nuggets (cut in half) + 1 cup pudding + soda/water Snack: none  Typical Beverages: Hi-C, diet soda (3 cans) thinned out with water (8 oz), water (available throughout the day) Nutrition Supplements: none   Notes: Mom notes concern for continued weakening which has led to difficulties with [employer]. Kashton will sign for food when he is hungry up to 6x/day, however when served food he will sometimes not eat it. Dagmawi's weakness with eating gradually gets worse throughout the day and mom expresses concern for aspiration pneumonia. Parents feel Eamonn is always hungry and is likely not eating enough to sustain himself. Parents note that Glenden is extremely picky and is even picky when it comes to brands of his accepted foods. Yakir does best when eating at his grandmother's house. Vale is currently not receiving any nutritional supplementation, he previously was drinking boosts however quickly tired of the creamy drink.   Physical Activity: wheelchair bound  GI: daily (texture changes)  GU: no concern  Estimated intake not meeting needs given poor weight gain and underweight status.  Pt not consuming various food groups.  Pt consuming inadequate amounts of fruits, vegetables, grains and dairy.  Nutrition Diagnosis: (10/5) Increased nutrient needs related to inadequate energy intake as evidenced by underweight status, loss of weight and requiring nutritional supplement to meet nutrient needs.   Intervention: Discussed pt's weight trends and current intake. Discussed MBS with NP given parental report of swallowing and mastication difficulties. Discussed ways to increase calories and MVI supplementation. RD provided sample of Ensure Clear during visit which Tayvin thoroughly enjoyed. Discussed recommendations below. All questions answered, family in agreement with plan.   Nutrition Recommendations: -  Goal for at least 1 ensure clear per day. I will put in an order with [facility] - Try offering [place] pureed foods (fruits, vegetables) and add in extra calories where you can. This will help with him going to the bathroom as well.  - Limit soda to no more than 1 can per day, offer water, whole milk or ensure instead.  - Focus on creating a meal/snack time routine by serving 3 meals and a snack in-between to create hunger for mealtimes and prevent grazing.  - Limit meals to 30 minutes.  - Increase calories where able. Add 1 tsp of oil or butter to Juluis's foods. Incorporate nuts, seeds, nut butter, avocado, cheese, etc when possible.  - Work on offering small variations of Shelden's accepted foods (different brand, different flavor, different shape, etc).  - I recommend starting a liquid multivitamin, you can find these on [place] or online -- centrum liquid, Alive, [employer] or [employer].   Keep up the good work!    Handouts Given: - GG High Calorie, High Protein Foods  Teach back method used.  Monitoring/Evaluation: Continue to Monitor: - Growth trends  - PO intake  - Need for higher calories nutritional supplement or Duocal  Follow-up in 3 months, joint with Rockwell Germany, NP.  Total time spent in counseling: 45 minutes.

## 2022-08-08 NOTE — Telephone Encounter (Signed)
I spoke to the pharmacy and they said they haven't received the RX for depakote sprinkles that was sent in on 07/11/22. They were going to add on 5 RFS to his last RX from back in April,  but I informed her that I wasn't authorized to send in this RX.  Asked Rockwell Germany if she will send the RX again or fax it over to them?  SS, CCMA

## 2022-08-08 NOTE — Progress Notes (Signed)
RD faxed updated order for 1 ensure clear/boost breeze to [facility] @ [phone].

## 2022-08-08 NOTE — Patient Instructions (Addendum)
Nutrition Recommendations: - Goal for at least 1 ensure clear per day. I will put in an order with [facility] - Try offering [place]aylyn pureed foods (fruits, vegetables) and add in extra calories where you can. This will help with him going to the bathroom as well.  - Limit soda to no more than 1 can per day, offer water, whole milk or ensure instead.  - Focus on creating a meal/snack time routine by serving 3 meals and a snack in-between to create hunger for mealtimes and prevent grazing.  - Limit meals to 30 minutes.  - Increase calories where able. Add 1 tsp of oil or butter to Theodore Lewis's foods. Incorporate nuts, seeds, nut butter, avocado, cheese, etc when possible.  - Work on offering small variations of General's accepted foods (different brand, different flavor, different shape, etc).  - I recommend starting a liquid multivitamin, you can find these on [place] or online -- centrum liquid, Alive, Animal [employer] or [employer].   Keep up the good work!

## 2022-08-08 NOTE — Patient Instructions (Addendum)
It was a pleasure to see you today!  Instructions for you until your next appointment are as follows: I have ordered a swallow study for [facility] This will be done at the hospital. You will receive a call from the hospital to schedule the test.  Be sure to follow the recommendations by the dietician today Let me know if Yussef has seizures or if you have any other concerns. Please sign up for MyChart if you have not done so. Please plan to return for follow up in 3 months or sooner if needed.  Feel free to contact our office during normal business hours at [phone] with questions or concerns. If there is no answer or the call is outside business hours, please leave a message and our clinic staff will call you back within the next business day.  If you have an urgent concern, please stay on the line for our after-hours answering service and ask for the on-call neurologist.     I also encourage you to use MyChart to communicate with me more directly. If you have not yet signed up for MyChart within [facility], the front desk staff can help you. However, please note that this inbox is NOT monitored on nights or weekends, and response can take up to 2 business days.  Urgent matters should be discussed with the on-call pediatric neurologist.   At [facility], we are committed to providing exceptional care. You will receive a patient satisfaction survey through text or email regarding your visit today. Your opinion is important to me. Comments are appreciated.

## 2022-08-09 ENCOUNTER — Encounter (INDEPENDENT_AMBULATORY_CARE_PROVIDER_SITE_OTHER): Payer: Self-pay | Admitting: Family

## 2022-08-09 DIAGNOSIS — E46 Unspecified protein-calorie malnutrition: Secondary | ICD-10-CM | POA: Insufficient documentation

## 2022-08-09 DIAGNOSIS — R1310 Dysphagia, unspecified: Secondary | ICD-10-CM | POA: Insufficient documentation

## 2022-08-09 DIAGNOSIS — R6339 Other feeding difficulties: Secondary | ICD-10-CM | POA: Insufficient documentation

## 2022-08-14 ENCOUNTER — Other Ambulatory Visit (INDEPENDENT_AMBULATORY_CARE_PROVIDER_SITE_OTHER): Payer: Self-pay | Admitting: Family

## 2022-08-14 DIAGNOSIS — G808 Other cerebral palsy: Secondary | ICD-10-CM

## 2022-08-22 ENCOUNTER — Ambulatory Visit (HOSPITAL_COMMUNITY)
Admission: RE | Admit: 2022-08-22 | Discharge: 2022-08-22 | Disposition: A | Discharge status: Home / Self Care | Payer: Medicare Other | Source: Ambulatory Visit | Attending: Internal Medicine | Admitting: Internal Medicine

## 2022-08-22 DIAGNOSIS — Z8669 Personal history of other diseases of the nervous system and sense organs: Secondary | ICD-10-CM | POA: Insufficient documentation

## 2022-08-22 DIAGNOSIS — F419 Anxiety disorder, unspecified: Secondary | ICD-10-CM | POA: Diagnosis not present

## 2022-08-22 DIAGNOSIS — R6339 Other feeding difficulties: Secondary | ICD-10-CM | POA: Diagnosis not present

## 2022-08-22 DIAGNOSIS — K219 Gastro-esophageal reflux disease without esophagitis: Secondary | ICD-10-CM | POA: Diagnosis not present

## 2022-08-22 DIAGNOSIS — G40909 Epilepsy, unspecified, not intractable, without status epilepticus: Secondary | ICD-10-CM | POA: Insufficient documentation

## 2022-08-22 DIAGNOSIS — K0889 Other specified disorders of teeth and supporting structures: Secondary | ICD-10-CM | POA: Diagnosis not present

## 2022-08-22 DIAGNOSIS — R1311 Dysphagia, oral phase: Secondary | ICD-10-CM | POA: Insufficient documentation

## 2022-08-22 DIAGNOSIS — G809 Cerebral palsy, unspecified: Secondary | ICD-10-CM | POA: Diagnosis not present

## 2022-08-22 DIAGNOSIS — E46 Unspecified protein-calorie malnutrition: Secondary | ICD-10-CM | POA: Insufficient documentation

## 2022-08-22 DIAGNOSIS — M6281 Muscle weakness (generalized): Secondary | ICD-10-CM | POA: Insufficient documentation

## 2022-08-22 DIAGNOSIS — G808 Other cerebral palsy: Secondary | ICD-10-CM | POA: Diagnosis not present

## 2022-08-22 DIAGNOSIS — Z79899 Other long term (current) drug therapy: Secondary | ICD-10-CM | POA: Diagnosis not present

## 2022-08-22 DIAGNOSIS — F71 Moderate intellectual disabilities: Secondary | ICD-10-CM | POA: Diagnosis not present

## 2022-08-22 DIAGNOSIS — R634 Abnormal weight loss: Secondary | ICD-10-CM | POA: Diagnosis not present

## 2022-08-22 DIAGNOSIS — R1310 Dysphagia, unspecified: Secondary | ICD-10-CM

## 2022-08-23 ENCOUNTER — Telehealth (INDEPENDENT_AMBULATORY_CARE_PROVIDER_SITE_OTHER): Payer: Self-pay | Admitting: Family

## 2022-08-23 DIAGNOSIS — R933 Abnormal findings on diagnostic imaging of other parts of digestive tract: Secondary | ICD-10-CM

## 2022-08-23 DIAGNOSIS — R634 Abnormal weight loss: Secondary | ICD-10-CM

## 2022-08-23 DIAGNOSIS — G808 Other cerebral palsy: Secondary | ICD-10-CM

## 2022-08-23 DIAGNOSIS — G40209 Localization-related (focal) (partial) symptomatic epilepsy and epileptic syndromes with complex partial seizures, not intractable, without status epilepticus: Secondary | ICD-10-CM

## 2022-08-23 DIAGNOSIS — G40309 Generalized idiopathic epilepsy and epileptic syndromes, not intractable, without status epilepticus: Secondary | ICD-10-CM

## 2022-08-23 DIAGNOSIS — R1310 Dysphagia, unspecified: Secondary | ICD-10-CM

## 2022-08-23 MED ORDER — CARBAMAZEPINE ER 300 MG PO CP12: 300.0000 mg | ORAL_CAPSULE | Freq: Two times a day (BID) | ORAL | 3 refills | Status: DC

## 2022-08-23 NOTE — Telephone Encounter (Signed)
I called and reviewed the swallow study with Mom and recommendations by SLP for referral to GI. She agreed with this plan.   Mom asked about problems filling Carbatrol. I called the pharmacy and learned that there is a manufacturer backorder for brand Carbatrol. I called Mom back and explained the backorder, and recommended switching to generic formulation. She agreed with this plan and I sent in updated Rx to the pharmacy. TG

## 2022-08-29 ENCOUNTER — Ambulatory Visit: Payer: Medicare Other | Attending: Family

## 2022-08-29 DIAGNOSIS — R293 Abnormal posture: Secondary | ICD-10-CM | POA: Insufficient documentation

## 2022-08-29 DIAGNOSIS — R2689 Other abnormalities of gait and mobility: Secondary | ICD-10-CM | POA: Insufficient documentation

## 2022-08-29 DIAGNOSIS — G808 Other cerebral palsy: Secondary | ICD-10-CM | POA: Insufficient documentation

## 2022-08-29 DIAGNOSIS — M6281 Muscle weakness (generalized): Secondary | ICD-10-CM | POA: Insufficient documentation

## 2022-08-29 DIAGNOSIS — R262 Difficulty in walking, not elsewhere classified: Secondary | ICD-10-CM | POA: Insufficient documentation

## 2022-08-29 NOTE — Therapy (Signed)
OUTPATIENT PHYSICAL THERAPY NEURO EVALUATION   Patient Name: Theodore Lewis MRN: [id number] DOB:08/23/1996, 26 y.o., male Today's Date: 08/29/2022   PCP: Sanda Linger, MD REFERRING PROVIDER: Elveria Rising, NP    PT End of Session - 08/29/22 1310     Visit Number 1    Date for PT Re-Evaluation 10/10/22    Authorization Type [employer] medicare/medicaid    PT Start Time 1315    PT Stop Time 1350    PT Time Calculation (min) 35 min    Activity Tolerance Patient tolerated treatment well    Behavior During Therapy WFL for tasks assessed/performed             Past Medical History:  Diagnosis Date   Anxiety    Cerebral palsy (HCC)    Congenital quadriparesis (HCC)    Neuromuscular disorder (HCC)    Seizure (HCC)    Seizures (HCC)    last seizure 3 yrs ago   Past Surgical History:  Procedure Laterality Date   BRAIN SURGERY     Shunt placed when he was a yr. old   I & D EXTREMITY Bilateral 04/29/2014   Procedure: IRRIGATION AND DEBRIDEMENT EXTREMITY;  Surgeon: Eldred Manges, MD;  Location: [facility];  Service: Orthopedics;  Laterality: Bilateral;  Right and Left Hamstring Lenghening and Fiberglass Casting   LEG TENDON SURGERY Bilateral approx 6 yrs ago   Patient Active Problem List   Diagnosis Date Noted   Dysphagia 08/09/2022   Protein-calorie malnutrition (HCC) 08/09/2022   Picky eater 08/09/2022   Decreased motor strength 11/12/2021   Thrombocytopenia (HCC) 08/02/2021   Seasonal allergic rhinitis due to pollen 08/02/2021   Cachexia (HCC) 08/02/2021   Loss of weight 09/22/2020   Oppositional behavior 09/22/2020   Restlessness and agitation 04/20/2015   Moderate intellectual disabilities 02/24/2013   History of congenital brain abnormality 02/24/2013   Obstructive hydrocephalus (HCC) 02/24/2013   Congenital quadriplegia (HCC) 02/24/2013   Generalized convulsive epilepsy (HCC) 02/24/2013   Partial epilepsy with impairment of consciousness (HCC) 02/24/2013    ONSET  DATE: 08/14/2022 referral  REFERRING DIAG: G80.8 (ICD-10-CM) - Congenital quadriplegia (HCC)   THERAPY DIAG:  Abnormal posture  Congenital quadriplegia (HCC)  Difficulty in walking, not elsewhere classified  Muscle weakness (generalized)  Other abnormalities of gait and mobility  Rationale for Evaluation and Treatment Rehabilitation  SUBJECTIVE:                                                                                                                                                                                             SUBJECTIVE STATEMENT: Patient presents with family. They are  interested in aquatic therapy. Patient has not received much therapy since beginning of COVID. Patient is currently having seizures- triggers seem to be fatigue, generally not feeling well. Seizures are not grand-mal or tonic clonic, but seem to be more absent seizures. Patient is also incontinent of b/b- did previously wear swim pull ups when he got in the pool with his school. Patient is currently only able to pull to stand on lower surfaces and crawl. He is non-ambulatory.    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 No  PRECAUTIONS: Fall  WEIGHT BEARING RESTRICTIONS: No  FALLS: Has patient fallen in last 6 months? No  LIVING ENVIRONMENT: Lives with: lives with their family Lives in: House/apartment Stairs: No Has following equipment at home: Wheelchair (manual), shower chair, and Grab bars  PLOF: Requires assistive device for independence, Needs assistance with ADLs, and Needs  assistance with transfers  PATIENT GOALS: "improve strength" per family  OBJECTIVE:   COGNITION: Overall cognitive status: History of cognitive impairments - at baseline   GAIT: Non-ambulatory   TODAY'S TREATMENT:                                                                                   N/A eval   PATIENT EDUCATION: Education details: aquatic contraindications Person educated: Patient and Parent Education method: Explanation Education comprehension: verbalized understanding  HOME EXERCISE PROGRAM: To be provided   GOALS: Goals reviewed with patient? Yes  SHORT TERM GOALS: Goals to be set once it is determined which location could best meet patients needs.   ASSESSMENT:  CLINICAL IMPRESSION: Patient is a 26 y.o. male who was seen today for physical therapy evaluation and treatment for aquatic therapy evaluation. Patient currently experiences seizures that the parents describe to be like absent seizures. He is also incontinent of both B/B and is unable to communicate those needs. Based on those 2 contraindications, seizures and incontinence, patient is not appropriate for aquatic therapy. However, parents reporting that he is losing his ability to pull to stand and crawl sufficiently, which he previously was able to do with ease. This PT is reaching out to the pediatric clinic to see if they would be able to meet the patients needs. Otherwise, patient may be best suited with home health to have him become more efficient and independent within his home.   OBJECTIVE IMPAIRMENTS: decreased activity tolerance, decreased balance, decreased cognition, decreased coordination, decreased endurance, decreased knowledge of condition, decreased knowledge of use of DME, decreased mobility, difficulty walking, decreased ROM, decreased strength, increased muscle spasms, impaired flexibility, impaired tone, impaired UE functional use, and postural dysfunction.   ACTIVITY LIMITATIONS:  lifting, bending, sitting, standing, squatting, stairs, transfers, bed mobility, continence, bathing, dressing, self feeding, reach over head, hygiene/grooming, and locomotion level  PARTICIPATION LIMITATIONS: meal prep, interpersonal relationship, community activity, occupation, and school  PERSONAL FACTORS:  Behavior pattern, Past/current experiences, Time since onset of injury/illness/exacerbation, Transportation, and 1-2 comorbidities: seizures, incontinence, spastic quad  are also affecting patient's functional outcome.   REHAB POTENTIAL: Fair time since onset  CLINICAL DECISION MAKING: Evolving/moderate complexity  EVALUATION COMPLEXITY: Moderate  PLAN:  PT FREQUENCY:  to be set once determined which setting patient will be served in  PT DURATION: other: to be set once it's determined which setting patient will be served in  PLANNED INTERVENTIONS: Therapeutic exercises, Therapeutic activity, Neuromuscular re-education, Balance training, Gait training, Patient/Family education, Self Care, Joint mobilization, Vestibular training, Visual/preceptual remediation/compensation, Orthotic/Fit training, DME instructions, Aquatic Therapy, Cognitive remediation, Electrical stimulation, Wheelchair mobility training, Taping, Vasopneumatic device, Manual therapy, and Re-evaluation  PLAN FOR NEXT SESSION: determine setting patient will be seen in   Westley Foots, PT Westley Foots, PT, DPT, CBIS  08/29/2022, 2:54 PM

## 2022-09-06 ENCOUNTER — Ambulatory Visit: Payer: Medicare Other | Attending: Internal Medicine

## 2022-09-06 DIAGNOSIS — R262 Difficulty in walking, not elsewhere classified: Secondary | ICD-10-CM | POA: Diagnosis not present

## 2022-09-06 DIAGNOSIS — R2689 Other abnormalities of gait and mobility: Secondary | ICD-10-CM | POA: Insufficient documentation

## 2022-09-06 DIAGNOSIS — R293 Abnormal posture: Secondary | ICD-10-CM | POA: Diagnosis not present

## 2022-09-06 DIAGNOSIS — M6281 Muscle weakness (generalized): Secondary | ICD-10-CM | POA: Diagnosis not present

## 2022-09-06 NOTE — Therapy (Signed)
OUTPATIENT PHYSICAL THERAPY NEURO EVALUATION   Patient Name: Theodore Lewis MRN: [id number] DOB:July 09, 1996, 26 y.o., male Today's Date: 09/06/2022   PCP: Rockwell Germany, NP  REFERRING PROVIDER: Rockwell Germany, NP    PT End of Session - 09/06/22 1237     Visit Number 1    Number of Visits 21    Date for PT Re-Evaluation 11/29/22    Authorization Type [employer] medicare/medicaid    PT Start Time 0865   patient late   PT Stop Time 1315    PT Time Calculation (min) 40 min    Activity Tolerance Patient tolerated treatment well    Behavior During Therapy WFL for tasks assessed/performed             Past Medical History:  Diagnosis Date   Anxiety    Cerebral palsy ([place])    Congenital quadriparesis ([place])    Neuromuscular disorder ([place])    Seizure ([place])    Seizures ([place])    last seizure 3 yrs ago   Past Surgical History:  Procedure Laterality Date   BRAIN SURGERY     Shunt placed when he was a yr. old   I & D EXTREMITY Bilateral 04/29/2014   Procedure: IRRIGATION AND DEBRIDEMENT EXTREMITY;  Surgeon: Marybelle Killings, MD;  Location: [facility];  Service: Orthopedics;  Laterality: Bilateral;  Right and Left Hamstring Lenghening and Fiberglass Casting   LEG TENDON SURGERY Bilateral approx 6 yrs ago   Patient Active Problem List   Diagnosis Date Noted   Dysphagia 08/09/2022   Protein-calorie malnutrition ([place]) 08/09/2022   Picky eater 08/09/2022   Decreased motor strength 11/12/2021   Thrombocytopenia ([place]) 08/02/2021   Seasonal allergic rhinitis due to pollen 08/02/2021   Cachexia ([place]) 08/02/2021   Loss of weight 09/22/2020   Oppositional behavior 09/22/2020   Restlessness and agitation 04/20/2015   Moderate intellectual disabilities 02/24/2013   History of congenital brain abnormality 02/24/2013   Obstructive hydrocephalus ([place]) 02/24/2013   Congenital quadriplegia ([place]) 02/24/2013   Generalized convulsive epilepsy ([place]) 02/24/2013   Partial epilepsy with impairment of  consciousness ([place]) 02/24/2013    ONSET DATE: 08/14/2022 referral  REFERRING DIAG: G80.8 (ICD-10-CM) - Congenital quadriplegia ([place])   THERAPY DIAG:  Abnormal posture  Difficulty in walking, not elsewhere classified  Muscle weakness (generalized)  Other abnormalities of gait and mobility  Rationale for Evaluation and Treatment: Rehabilitation  SUBJECTIVE:                                                                                                                                                                                             SUBJECTIVE  STATEMENT: Patient reports doing well. Here with mother and father. Patient was last able to transfer himself into manual TIS about 4 years ago. Just recently started being able to propel himself in the wheelchair. Wheelchair about 26 year old.  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  Did not indicate pain   PRECAUTIONS: Fall and Other: seizure  WEIGHT BEARING RESTRICTIONS: No  FALLS: Has patient fallen in last 6 months? No  LIVING ENVIRONMENT: Lives with: lives with their family Lives in: House/apartment Stairs: No Has following equipment at home: Wheelchair (manual), shower chair, and Grab bars  PLOF: Needs assistance with ADLs and Needs assistance with transfers  PATIENT GOALS: per parents: to be able to pull to stand and transfer in/out of bed/wheelchair/couch  OBJECTIVE:   COGNITION: Overall cognitive status: History of cognitive impairments - at baseline   SENSATION: Difficult to  assess  COORDINATION: Spastic quadriplegia with limited AROM limiting full assessment as well as patients limited ability to follow cues  MUSCLE TONE: LLE: adduction 2+ MAS; abduction 2+ MAS RLE: adduction 2+ MAS; abduction 2+ MAS  MUSCLE LENGTH: Hamstrings: Right ~30 deg; Left ~30 deg    POSTURE: rounded shoulders, forward head, increased thoracic kyphosis, posterior pelvic tilt, and flexed trunk   BED MOBILITY:  Family reports assisting patient, but has found him transferred out of his bed at times without assist  TRANSFERS: Assistive device utilized: None  Sit to stand: Total A Chair to chair: Total A mom basket-carrying patient to therapy mat  GAIT: Non ambulatory at this time  SITTING BALANCE Able to sit EOM with close supervision/CGA, reaching outside BOS with CGA/MinA  TODAY'S TREATMENT:                                                                                                                              N/a eval   PATIENT EDUCATION: Education details: PT POC, exam findings, horsepower therapy, wc components for safe transfers Person educated: Patient and Parent Education method: Explanation, Demonstration, and Handouts Education comprehension: verbalized understanding and needs further education  HOME EXERCISE PROGRAM: To be provided  GOALS: Goals reviewed with patient? Yes  SHORT TERM GOALS: Target date: 10/11/2022  Patient will complete HEP at least 3x a week with assist from family Baseline: to be provided Goal status: INITIAL  2.  Mother will demonstrate safe transfer mechanics for bed <> wc, ModI Baseline: requiring increased cuing for safety Goal status: INITIAL  3.  Patient will initiate standing with LRAD and assist as needed Baseline: did not stand at eval Goal status: INITIAL  4.  Patient will initiate pull to  stand motion with LRAD and assist as needed Baseline: did not initiate on eval Goal status: INITIAL  5.  Patient will  improve R knee extension to </= 20* from full extension Baseline: ~30* Goal status: INITIAL  6.  Patient will improve L knee extension to </= 20* from full extension Baseline: ~30* Goal status: INITIAL  LONG TERM GOALS: Target date: 11/29/2022  Patient will complete final HEP at least 3x a week with assist from family Baseline: to be provided Goal status: INITIAL  Patient will participate in bed <> wc transfer with LRAD Baseline: Requiring TotalA from mother Goal status: INITIAL  Patient will stand with LRAD and assist as needed for at least 1 min Baseline: did not stand at eval Goal status: INITIAL  Patient will initiate gait training with LRAD and assist as needed Baseline: did not initiate on eval Goal status: INITIAL  Patient will improve R knee extension to </= 10* from full extension Baseline: ~30* Goal status: INITIAL  6. Patient will improve L knee extension to </= 10* from full extension Baseline: ~30* Goal status: INITIAL  ASSESSMENT:  CLINICAL IMPRESSION: Patient is a 26 y.o. male who was seen today for physical therapy evaluation and treatment for general decline in function and impaired ability to complete transfers independently. Patient currently in manual tilt in space wheelchair. PT recommending that wheelchair height be lowered if able to assist mother with maintaining appropriate ergonomics during transfers, as well as improve patients ability to successfully complete floor<> wc transfers. Mother completing basket carry transfer with limited support of patients lower body and without removing swing away leg rests. PT instructing and demonstrating to mother how to remove leg rests to improve efficiency and safety of transfers. Provided family with HorsePower therapy information. Patient with Fair static sitting balance EOM with close supervision/CGA. Significant hamstring tone limiting full extension of B knees severely impacting his ability to stand, even with  AD. Patient able to propel himself in wc ~31ft at a time predominantly with R UE. Patient would benefit from skilled PT services to address the above mentioned deficits..   OBJECTIVE IMPAIRMENTS: Abnormal gait, decreased balance, decreased cognition, decreased coordination, decreased knowledge of condition, decreased knowledge of use of DME, decreased mobility, difficulty walking, decreased ROM, decreased strength, hypomobility, impaired flexibility, impaired tone, impaired UE functional use, impaired vision/preception, improper body mechanics, and postural dysfunction.   ACTIVITY LIMITATIONS: sitting, standing, transfers, bed mobility, bathing, dressing, and locomotion level  PARTICIPATION LIMITATIONS: meal prep, interpersonal relationship, community activity, occupation, and school  PERSONAL FACTORS: Behavior pattern, Education, Past/current experiences, Time since onset of injury/illness/exacerbation, Transportation, and 1-2 comorbidities: seizures, congenital quadriplegia   are also affecting patient's functional outcome.   REHAB POTENTIAL: Fair time since onset  CLINICAL DECISION MAKING: Evolving/moderate complexity  EVALUATION COMPLEXITY: Moderate  PLAN:  PT FREQUENCY: 2x/week  PT DURATION: 10 weeks  PLANNED INTERVENTIONS: Therapeutic exercises, Therapeutic activity, Neuromuscular re-education, Balance training, Gait training, Patient/Family education, Self Care, Joint mobilization, Stair training, Vestibular training, Visual/preceptual remediation/compensation, Orthotic/Fit training, DME instructions, Aquatic Therapy, Cognitive remediation, Electrical stimulation, Wheelchair mobility training, Taping, Manual therapy, and Re-evaluation  PLAN FOR NEXT SESSION: did this bring AFOs?, height of bed? Attempt standing? Bed mobility?   Debbora Dus, PT Debbora Dus, PT, DPT, CBIS  09/06/2022, 1:36 PM

## 2022-09-11 ENCOUNTER — Ambulatory Visit: Payer: Medicare Other | Admitting: Physical Therapy

## 2022-09-11 DIAGNOSIS — R2689 Other abnormalities of gait and mobility: Secondary | ICD-10-CM

## 2022-09-11 DIAGNOSIS — R262 Difficulty in walking, not elsewhere classified: Secondary | ICD-10-CM

## 2022-09-11 DIAGNOSIS — R293 Abnormal posture: Secondary | ICD-10-CM | POA: Diagnosis not present

## 2022-09-11 DIAGNOSIS — M6281 Muscle weakness (generalized): Secondary | ICD-10-CM | POA: Diagnosis not present

## 2022-09-11 NOTE — Therapy (Signed)
OUTPATIENT PHYSICAL THERAPY NEURO TREATMENT   Patient Name: Theodore Lewis MRN: [id number] DOB:18-Sep-1996, 26 y.o., male Today's Date: 09/11/2022   PCP: Elveria Rising, NP  REFERRING PROVIDER: Elveria Rising, NP    PT End of Session - 09/11/22 1319     Visit Number 2    Number of Visits 21    Date for PT Re-Evaluation 11/29/22    Authorization Type [employer] medicare/medicaid    PT Start Time 1232    PT Stop Time 1319    PT Time Calculation (min) 47 min    Activity Tolerance Patient tolerated treatment well    Behavior During Therapy WFL for tasks assessed/performed              Past Medical History:  Diagnosis Date   Anxiety    Cerebral palsy (HCC)    Congenital quadriparesis (HCC)    Neuromuscular disorder (HCC)    Seizure (HCC)    Seizures (HCC)    last seizure 3 yrs ago   Past Surgical History:  Procedure Laterality Date   BRAIN SURGERY     Shunt placed when he was a yr. old   I & D EXTREMITY Bilateral 04/29/2014   Procedure: IRRIGATION AND DEBRIDEMENT EXTREMITY;  Surgeon: Eldred Manges, MD;  Location: [facility];  Service: Orthopedics;  Laterality: Bilateral;  Right and Left Hamstring Lenghening and Fiberglass Casting   LEG TENDON SURGERY Bilateral approx 6 yrs ago   Patient Active Problem List   Diagnosis Date Noted   Dysphagia 08/09/2022   Protein-calorie malnutrition (HCC) 08/09/2022   Picky eater 08/09/2022   Decreased motor strength 11/12/2021   Thrombocytopenia (HCC) 08/02/2021   Seasonal allergic rhinitis due to pollen 08/02/2021   Cachexia (HCC) 08/02/2021   Loss of weight 09/22/2020   Oppositional behavior 09/22/2020   Restlessness and agitation 04/20/2015   Moderate intellectual disabilities 02/24/2013   History of congenital brain abnormality 02/24/2013   Obstructive hydrocephalus (HCC) 02/24/2013   Congenital quadriplegia (HCC) 02/24/2013   Generalized convulsive epilepsy (HCC) 02/24/2013   Partial epilepsy with impairment of consciousness  (HCC) 02/24/2013    ONSET DATE: 08/14/2022 referral  REFERRING DIAG: G80.8 (ICD-10-CM) - Congenital quadriplegia (HCC)   THERAPY DIAG:  Abnormal posture  Difficulty in walking, not elsewhere classified  Other abnormalities of gait and mobility  Muscle weakness (generalized)  Rationale for Evaluation and Treatment: Rehabilitation  SUBJECTIVE:                                                                                                                                                                                             SUBJECTIVE STATEMENT: Patient  presents with Mother and Father. Mother reports high levels of fatigue, is not sleeping more than 3-4 hours per night. No changes since last session. 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  Did not indicate pain   PRECAUTIONS: Fall and Other: seizure  WEIGHT BEARING RESTRICTIONS: No  FALLS: Has patient fallen in last 6 months? No  LIVING ENVIRONMENT: Lives with: lives with their family Lives in: House/apartment Stairs: No Has following equipment at home: Wheelchair (manual), shower chair, and Grab bars  PLOF: Needs assistance with ADLs and Needs assistance with transfers  PATIENT GOALS: per parents: to be able to pull to stand and transfer in/out of bed/wheelchair/couch  OBJECTIVE:   COGNITION: Overall cognitive status: History of cognitive impairments - at baseline  COORDINATION: Spastic quadriplegia with limited AROM limiting full assessment as well as patients limited  ability to follow cues  MUSCLE TONE: LLE: adduction 2+ MAS; abduction 2+ MAS RLE: adduction 2+ MAS; abduction 2+ MAS  MUSCLE LENGTH: Hamstrings: Right ~30 deg; Left ~30 deg   POSTURE: rounded shoulders, forward head, increased thoracic kyphosis, posterior pelvic tilt, and flexed trunk   TODAY'S TREATMENT:  Ther Act  Mother reports pt's bed is 29.5" tall at home. Will use measurement to assist with pull to stand in clinic  Mother declined use of posterior walker at this time due to large size and the complexity of use.  Attempted to don pt's AFOs and pt agreeable to wear L AFO but refuses R. Therapist had significant difficulty placing R foot into AFO, as his malleoli do not fit. Upon further assessment of R foot, noted small bruise/discoloration along distal head of 5th metarsal, which is sensitive to touch. Noted pt places a lot of pressure on this part of his foot in his footrest and rubs against edge of footrest as well. Encouraged family to reach out to PCP to obtain X-ray of foot and order for modifications to be made to AFOs.  Pt only agreeable to therapy if therapist "pushed" him in a lap around the gym (performed 4x115'). Instructed pt that therapist would only push if he helped her (propelling w/RUE). Encouraged use of LUE, which pt used last session, but pt only using RUE.  Educated mother on proper stretching of hamstrings in both seated and supine positions but mother will benefit from further education as she has not been performing at home.  Encouraged mother and pt to start wear schedule of LLE AFO for now up to tolerance, performing frequent skin checks. Mother verbalized understanding.    GAIT: Non ambulatory at this time    PATIENT EDUCATION: Education details: See above Person educated: Patient and Parent Education method: Explanation, Demonstration, and Handouts Education comprehension: verbalized understanding and needs further education  HOME EXERCISE  PROGRAM: To be provided  GOALS: Goals reviewed with patient? Yes  SHORT TERM GOALS: Target date: 10/11/2022  Patient will complete HEP at least 3x a week with assist from family Baseline: to be provided Goal status: INITIAL  2.  Mother will demonstrate safe transfer mechanics for bed <> wc, ModI Baseline: requiring increased cuing for safety Goal status: INITIAL  3.  Patient will initiate standing with LRAD and assist as needed Baseline: did not stand at eval Goal status: INITIAL  4.  Patient will initiate pull to stand motion with LRAD and assist as needed Baseline: did not initiate on eval Goal status: INITIAL  5.  Patient will improve R knee extension to </= 20* from full extension Baseline: ~30* Goal status: INITIAL  6.  Patient will improve L knee extension to </= 20* from full extension Baseline: ~30* Goal status: INITIAL  LONG TERM GOALS: Target date: 11/29/2022  Patient will complete final HEP at least 3x a week with assist from family Baseline: to be provided Goal status: INITIAL  Patient will participate in bed <> wc transfer with LRAD Baseline: Requiring TotalA from mother Goal status: INITIAL  Patient will stand with LRAD and assist as needed for at least 1 min Baseline: did not stand at eval Goal status: INITIAL  Patient will initiate gait training with LRAD and assist as needed Baseline: did not initiate on eval Goal status: INITIAL  Patient will improve R knee extension to </= 10* from full extension Baseline: ~30* Goal status: INITIAL  6. Patient will improve L knee extension to </= 10* from full extension Baseline: ~30* Goal status: INITIAL  ASSESSMENT:  CLINICAL IMPRESSION: Emphasis of skilled PT session on pt and family education and assessing fit of AFOs. Pt able to wear his L AFO well but cannot tolerate R AFO. Noted discoloration along 5th metatarsal head on R foot, so encouraged obtaining new imaging of foot to ensure there is no  discomfort/pain. Introduced stretching program to mother today, but due to her fatigue, will need further education. Continue POC.   OBJECTIVE IMPAIRMENTS: Abnormal gait, decreased balance, decreased cognition, decreased coordination, decreased knowledge of condition, decreased knowledge of use of DME, decreased mobility, difficulty walking, decreased ROM, decreased strength, hypomobility, impaired flexibility, impaired tone, impaired UE functional use, impaired vision/preception, improper body mechanics, and postural dysfunction.   ACTIVITY LIMITATIONS: sitting, standing, transfers, bed mobility, bathing, dressing, and locomotion level  PARTICIPATION LIMITATIONS: meal prep, interpersonal relationship, community activity, occupation, and school  PERSONAL FACTORS: Behavior pattern, Education, Past/current experiences, Time since onset of injury/illness/exacerbation, Transportation, and 1-2 comorbidities: seizures, congenital quadriplegia   are also affecting patient's functional outcome.   REHAB POTENTIAL: Fair time since onset  CLINICAL DECISION MAKING: Evolving/moderate complexity  EVALUATION COMPLEXITY: Moderate  PLAN:  PT FREQUENCY: 2x/week  PT DURATION: 10 weeks  PLANNED INTERVENTIONS: Therapeutic exercises, Therapeutic activity, Neuromuscular re-education, Balance training, Gait training, Patient/Family education, Self Care, Joint mobilization, Stair training, Vestibular training, Visual/preceptual remediation/compensation, Orthotic/Fit training, DME instructions, Aquatic Therapy, Cognitive remediation, Electrical stimulation, Wheelchair mobility training, Taping, Manual therapy, and Re-evaluation  PLAN FOR NEXT SESSION: did this bring AFOs? Attempt standing? Bed mobility? Stretching program    Jill Alexanders Shawntez Dickison, PT, DPT  09/11/2022, 1:20 PM

## 2022-09-18 ENCOUNTER — Ambulatory Visit: Payer: Medicare Other

## 2022-09-18 DIAGNOSIS — R262 Difficulty in walking, not elsewhere classified: Secondary | ICD-10-CM | POA: Diagnosis not present

## 2022-09-18 DIAGNOSIS — R293 Abnormal posture: Secondary | ICD-10-CM | POA: Diagnosis not present

## 2022-09-18 DIAGNOSIS — R2689 Other abnormalities of gait and mobility: Secondary | ICD-10-CM | POA: Diagnosis not present

## 2022-09-18 DIAGNOSIS — M6281 Muscle weakness (generalized): Secondary | ICD-10-CM | POA: Diagnosis not present

## 2022-09-18 NOTE — Therapy (Signed)
OUTPATIENT PHYSICAL THERAPY NEURO TREATMENT   Patient Name: Theodore Lewis MRN: [id number] DOB:30-Jul-1996, 26 y.o., male Today's Date: 09/18/2022   PCP: Rockwell Germany, NP  REFERRING PROVIDER: Rockwell Germany, NP    PT End of Session - 09/18/22 1235     Visit Number 3    Number of Visits 21    Date for PT Re-Evaluation 11/29/22    Authorization Type [employer] medicare/medicaid    PT Start Time 1234   patient late   PT Stop Time 1314    PT Time Calculation (min) 40 min    Activity Tolerance Patient tolerated treatment well;Treatment limited secondary to agitation    Behavior During Therapy [facility] for tasks assessed/performed;Restless              Past Medical History:  Diagnosis Date   Anxiety    Cerebral palsy ([place])    Congenital quadriparesis ([place])    Neuromuscular disorder ([place])    Seizure ([place])    Seizures ([place])    last seizure 3 yrs ago   Past Surgical History:  Procedure Laterality Date   BRAIN SURGERY     Shunt placed when he was a yr. old   I & D EXTREMITY Bilateral 04/29/2014   Procedure: IRRIGATION AND DEBRIDEMENT EXTREMITY;  Surgeon: Marybelle Killings, MD;  Location: [place];  Service: Orthopedics;  Laterality: Bilateral;  Right and Left Hamstring Lenghening and Fiberglass Casting   LEG TENDON SURGERY Bilateral approx 6 yrs ago   Patient Active Problem List   Diagnosis Date Noted   Dysphagia 08/09/2022   Protein-calorie malnutrition ([place]) 08/09/2022   Picky eater 08/09/2022   Decreased motor strength 11/12/2021   Thrombocytopenia ([place]) 08/02/2021   Seasonal allergic rhinitis due to pollen 08/02/2021   Cachexia ([place]) 08/02/2021   Loss of weight 09/22/2020   Oppositional behavior 09/22/2020   Restlessness and agitation 04/20/2015   Moderate intellectual disabilities 02/24/2013   History of congenital brain abnormality 02/24/2013   Obstructive hydrocephalus ([place]) 02/24/2013   Congenital quadriplegia ([place]) 02/24/2013   Generalized convulsive epilepsy ([place])  02/24/2013   Partial epilepsy with impairment of consciousness ([place]) 02/24/2013    ONSET DATE: 08/14/2022 referral  REFERRING DIAG: G80.8 (ICD-10-CM) - Congenital quadriplegia ([place])   THERAPY DIAG:  Abnormal posture  Other abnormalities of gait and mobility  Difficulty in walking, not elsewhere classified  Muscle weakness (generalized)  Rationale for Evaluation and Treatment: Rehabilitation  SUBJECTIVE:  SUBJECTIVE STATEMENT: Patient presents with Mother and Father. Mother reports high levels of fatigue, is not sleeping more than 3-4 hours per night. No changes since last session. 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  Did not indicate pain   PRECAUTIONS: Fall and Other: seizure  WEIGHT BEARING RESTRICTIONS: No  FALLS: Has patient fallen in last 6 months? No  LIVING ENVIRONMENT: Lives with: lives with their family Lives in: House/apartment Stairs: No Has following equipment at home: Wheelchair (manual), shower chair, and Grab bars  PLOF: Needs assistance with ADLs and Needs assistance with transfers  PATIENT GOALS: per parents: to be able to pull to stand and transfer in/out of bed/wheelchair/couch  OBJECTIVE:   COGNITION: Overall cognitive status: History of cognitive impairments - at baseline  COORDINATION: Spastic quadriplegia with limited  AROM limiting full assessment as well as patients limited ability to follow cues  MUSCLE TONE: LLE: adduction 2+ MAS; abduction 2+ MAS RLE: adduction 2+ MAS; abduction 2+ MAS  MUSCLE LENGTH: Hamstrings: Right ~30 deg; Left ~30 deg   POSTURE: rounded shoulders, forward head, increased thoracic kyphosis, posterior pelvic tilt, and flexed trunk   TODAY'S TREATMENT:  Ther Act  Able to don B AFOs putting inserts on first  3x115' pushing wc with Max encouragement to utilize L UE Provided mom with stretching HEP  Encouraged to wear AFOs daily for at least 15-20 mins and perform skin check afterward Demonstrated power standing frame and patient agreeable to trial at next visit    PATIENT EDUCATION: Education details: See above Person educated: Patient and Parent Education method: Explanation, Demonstration, and Handouts Education comprehension: verbalized understanding and needs further education  HOME EXERCISE PROGRAM: Access Code: [email or web address] URL: [email or web address] Date: 09/18/2022 Prepared by: Merry Lofty  Exercises - Supine Ankle Dorsiflexion Stretch with Caregiver  - 1 x daily - 7 x weekly - 3 sets - 1 min hold - Hip Abduction and Adduction Caregiver PROM  - 1 x daily - 7 x weekly - 3 sets - 1 min hold - Seated Hamstring Stretch  - 1 x daily - 7 x weekly - 3 sets - 1 min hold  GOALS: Goals reviewed with patient? Yes  SHORT TERM GOALS: Target date: 10/11/2022  Patient will complete HEP at least 3x a week with assist from family Baseline: to be provided Goal status: INITIAL  2.  Mother will demonstrate safe transfer mechanics for bed <> wc, ModI Baseline: requiring increased cuing for safety Goal status: INITIAL  3.  Patient will initiate standing with LRAD and assist as needed Baseline: did not stand at eval Goal status: INITIAL  4.  Patient will initiate pull to stand motion with LRAD and assist as needed Baseline: did not initiate on eval Goal  status: INITIAL  5.  Patient will improve R knee extension to </= 20* from full extension Baseline: ~30* Goal status: INITIAL  6.  Patient will improve L knee extension to </= 20* from full extension Baseline: ~30* Goal status: INITIAL  LONG TERM GOALS: Target date: 11/29/2022  Patient will complete final HEP at least 3x a week with assist from family Baseline: to  be provided Goal status: INITIAL  Patient will participate in bed <> wc transfer with LRAD Baseline: Requiring TotalA from mother Goal status: INITIAL  Patient will stand with LRAD and assist as needed for at least 1 min Baseline: did not stand at eval Goal status: INITIAL  Patient will initiate gait training with LRAD and assist as needed Baseline: did not initiate on eval Goal status: INITIAL  Patient will improve R knee extension to </= 10* from full extension Baseline: ~30* Goal status: INITIAL  6. Patient will improve L knee extension to </= 10* from full extension Baseline: ~30* Goal status: INITIAL  ASSESSMENT:  CLINICAL IMPRESSION: Patient seen for skilled PT session with emphasis on donning AFOs and introducing standing frame. Patient tolerated wearing B AFOs for ~15 mins with mild redness to dorsum on R foot, blanchable. Explained to mom that redness should go away in <20 mins. Agreeable to progressing wear time of AFOs at home with continued skin checks. Patient continues ot be motivated by propelling laps around the gym in wc. He was also agreeable to trial AFO at next visit for weight bearing and prolonged stretch. Continue POC.   OBJECTIVE IMPAIRMENTS: Abnormal gait, decreased balance, decreased cognition, decreased coordination, decreased knowledge of condition, decreased knowledge of use of DME, decreased mobility, difficulty walking, decreased ROM, decreased strength, hypomobility, impaired flexibility, impaired tone, impaired UE functional use, impaired vision/preception, improper body mechanics,  and postural dysfunction.   ACTIVITY LIMITATIONS: sitting, standing, transfers, bed mobility, bathing, dressing, and locomotion level  PARTICIPATION LIMITATIONS: meal prep, interpersonal relationship, community activity, occupation, and school  PERSONAL FACTORS: Behavior pattern, Education, Past/current experiences, Time since onset of injury/illness/exacerbation, Transportation, and 1-2 comorbidities: seizures, congenital quadriplegia   are also affecting patient's functional outcome.   REHAB POTENTIAL: Fair time since onset  CLINICAL DECISION MAKING: Evolving/moderate complexity  EVALUATION COMPLEXITY: Moderate  PLAN:  PT FREQUENCY: 2x/week  PT DURATION: 10 weeks  PLANNED INTERVENTIONS: Therapeutic exercises, Therapeutic activity, Neuromuscular re-education, Balance training, Gait training, Patient/Family education, Self Care, Joint mobilization, Stair training, Vestibular training, Visual/preceptual remediation/compensation, Orthotic/Fit training, DME instructions, Aquatic Therapy, Cognitive remediation, Electrical stimulation, Wheelchair mobility training, Taping, Manual therapy, and Re-evaluation  PLAN FOR NEXT SESSION: did this bring AFOs? Attempt standing? Bed mobility?    Debbora Dus, PT, DPT  09/18/2022, 1:20 PM

## 2022-09-20 ENCOUNTER — Ambulatory Visit: Payer: Medicare Other

## 2022-10-02 ENCOUNTER — Ambulatory Visit: Payer: Medicare Other | Admitting: Physical Therapy

## 2022-10-02 DIAGNOSIS — M6281 Muscle weakness (generalized): Secondary | ICD-10-CM

## 2022-10-02 DIAGNOSIS — R2689 Other abnormalities of gait and mobility: Secondary | ICD-10-CM | POA: Diagnosis not present

## 2022-10-02 DIAGNOSIS — R293 Abnormal posture: Secondary | ICD-10-CM

## 2022-10-02 DIAGNOSIS — R262 Difficulty in walking, not elsewhere classified: Secondary | ICD-10-CM

## 2022-10-02 NOTE — Therapy (Signed)
OUTPATIENT PHYSICAL THERAPY NEURO TREATMENT   Patient Name: Theodore Lewis MRN: [id number] DOB:Mar 18, 1996, 26 y.o., male Today's Date: 10/02/2022   PCP: Elveria Rising, NP  REFERRING PROVIDER: Elveria Rising, NP    PT End of Session - 10/02/22 1234     Visit Number 4    Number of Visits 21    Date for PT Re-Evaluation 11/29/22    Authorization Type [employer] medicare/medicaid    PT Start Time 1232    PT Stop Time 1307    PT Time Calculation (min) 35 min    Activity Tolerance Patient tolerated treatment well;Treatment limited secondary to agitation    Behavior During Therapy [facility] for tasks assessed/performed;Restless               Past Medical History:  Diagnosis Date   Anxiety    Cerebral palsy (HCC)    Congenital quadriparesis (HCC)    Neuromuscular disorder (HCC)    Seizure (HCC)    Seizures (HCC)    last seizure 3 yrs ago   Past Surgical History:  Procedure Laterality Date   BRAIN SURGERY     Shunt placed when he was a yr. old   I & D EXTREMITY Bilateral 04/29/2014   Procedure: IRRIGATION AND DEBRIDEMENT EXTREMITY;  Surgeon: Eldred Manges, MD;  Location: [facility] OR;  Service: Orthopedics;  Laterality: Bilateral;  Right and Left Hamstring Lenghening and Fiberglass Casting   LEG TENDON SURGERY Bilateral approx 6 yrs ago   Patient Active Problem List   Diagnosis Date Noted   Dysphagia 08/09/2022   Protein-calorie malnutrition (HCC) 08/09/2022   Picky eater 08/09/2022   Decreased motor strength 11/12/2021   Thrombocytopenia (HCC) 08/02/2021   Seasonal allergic rhinitis due to pollen 08/02/2021   Cachexia (HCC) 08/02/2021   Loss of weight 09/22/2020   Oppositional behavior 09/22/2020   Restlessness and agitation 04/20/2015   Moderate intellectual disabilities 02/24/2013   History of congenital brain abnormality 02/24/2013   Obstructive hydrocephalus (HCC) 02/24/2013   Congenital quadriplegia (HCC) 02/24/2013   Generalized convulsive epilepsy (HCC) 02/24/2013    Partial epilepsy with impairment of consciousness (HCC) 02/24/2013    ONSET DATE: 08/14/2022 referral  REFERRING DIAG: G80.8 (ICD-10-CM) - Congenital quadriplegia (HCC)   THERAPY DIAG:  Abnormal posture  Other abnormalities of gait and mobility  Difficulty in walking, not elsewhere classified  Muscle weakness (generalized)  Rationale for Evaluation and Treatment: Rehabilitation  SUBJECTIVE:  SUBJECTIVE STATEMENT: Patient presents with Mother, father stayed in waiting room. Mother reports she is able to perform stretching program about 1x/ day. Has not been wearing AFOs, "they have been in the [place] since last visit". 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  Did not indicate pain   PRECAUTIONS: Fall and Other: seizure  WEIGHT BEARING RESTRICTIONS: No  FALLS: Has patient fallen in last 6 months? No  LIVING ENVIRONMENT: Lives with: lives with their family Lives in: House/apartment Stairs: No Has following equipment at home: Wheelchair (manual), shower chair, and Grab bars  PLOF: Needs assistance with ADLs and Needs assistance with transfers  PATIENT GOALS: per parents: to be able to pull to stand and transfer in/out of bed/wheelchair/couch  OBJECTIVE:   COGNITION: Overall cognitive status: History of cognitive impairments - at  baseline  COORDINATION: Spastic quadriplegia with limited AROM limiting full assessment as well as patients limited ability to follow cues  MUSCLE TONE: LLE: adduction 2+ MAS; abduction 2+ MAS RLE: adduction 2+ MAS; abduction 2+ MAS  MUSCLE LENGTH: Hamstrings: Right ~30 deg; Left ~30 deg   POSTURE: rounded shoulders, forward head, increased thoracic kyphosis, posterior pelvic tilt, and flexed trunk   TODAY'S TREATMENT:  NMR Squat pivot from WC to standing frame on L side w/max A (pt places BUEs around therapist's neck and is able to actively push through BLEs on 8" box placed under feet)  Standing frame x10 minutes while driving pt around gym. Noted windswept pelvis to L side w/RLE in adduction, w/pt actively loosening R foot out of foot rest. Pt became very agitated during session, unclear if due to pain in hamstrings. Unable to obtain full standing position today due to pt irritability and difficulty w/pelvic positioning in frame.  Squat pivot from standing frame to power chair on R side using same technique as above.  Addressed mother's questions about use of gait belt, purpose of standing frame and importance of wearing AFOs during day as discussed previous session. Mother verbalized understanding.    PATIENT EDUCATION: Education details: See above Person educated: Patient and Parent Education method: Explanation, Demonstration, and Handouts Education comprehension: verbalized understanding and needs further education  HOME EXERCISE PROGRAM: Access Code: [email or web address] URL: [email or web address] Date: 09/18/2022 Prepared by: Merry Lofty  Exercises - Supine Ankle Dorsiflexion Stretch with Caregiver  - 1 x daily - 7 x weekly - 3 sets - 1 min hold - Hip Abduction and Adduction Caregiver PROM  - 1 x daily - 7 x weekly - 3 sets - 1 min hold - Seated Hamstring Stretch  - 1 x daily - 7 x weekly - 3 sets - 1 min hold  GOALS: Goals reviewed with patient? Yes  SHORT  TERM GOALS: Target date: 10/11/2022  Patient will complete HEP at least 3x a week with assist from family Baseline: to be provided Goal status: INITIAL  2.  Mother will demonstrate safe transfer mechanics for bed <> wc, ModI Baseline: requiring increased cuing for safety Goal status: INITIAL  3.  Patient will initiate standing with LRAD and assist as needed Baseline: did not stand at eval Goal status: INITIAL  4.  Patient will initiate pull to stand motion with LRAD and assist as needed Baseline: did not initiate on eval Goal status: INITIAL  5.  Patient will improve R knee extension to </= 20* from full extension Baseline: ~30* Goal status: INITIAL  6.  Patient will improve L knee extension to </= 20* from full extension Baseline: ~30* Goal status: INITIAL  LONG TERM GOALS: Target date: 11/29/2022  Patient will complete final HEP at least 3x a week with assist from family Baseline: to be provided Goal status: INITIAL  Patient will participate in bed <> wc transfer with LRAD Baseline: Requiring TotalA from mother Goal status: INITIAL  Patient will stand with LRAD and assist as needed for at least 1 min Baseline: did not stand at eval Goal status: INITIAL  Patient will initiate gait training with LRAD and assist as needed Baseline: did not initiate on eval Goal status: INITIAL  Patient will improve R knee extension to </= 10* from full extension Baseline: ~30* Goal status: INITIAL  6. Patient will improve L knee extension to </= 10* from full extension Baseline: ~30* Goal status: INITIAL  ASSESSMENT:  CLINICAL IMPRESSION: Emphasis of skilled PT session on AROM stretching of hamstrings, facilitating transfers and initiating weightbearing tolerance. Pt every agitated during session, requiring two therapists and mother to encourage pt to participate. Pt continues to be motivated by driving laps around the gym, today performed in standing frame. Pt's mother reports not  wearing AFOs at home and did not bring to session to assist w/positioning in standing frame. Encouraged mother to wear at home and bring to next session. Continue POC.   OBJECTIVE IMPAIRMENTS: Abnormal gait, decreased balance, decreased cognition, decreased coordination, decreased knowledge of condition, decreased knowledge of use of DME, decreased mobility, difficulty walking, decreased ROM, decreased strength, hypomobility, impaired flexibility, impaired tone, impaired UE functional use, impaired vision/preception, improper body mechanics, and postural dysfunction.   ACTIVITY LIMITATIONS: sitting, standing, transfers, bed mobility, bathing, dressing, and locomotion level  PARTICIPATION LIMITATIONS: meal prep, interpersonal relationship, community activity, occupation, and school  PERSONAL FACTORS: Behavior pattern, Education, Past/current experiences, Time since onset of injury/illness/exacerbation, Transportation, and 1-2 comorbidities: seizures, congenital quadriplegia   are also affecting patient's functional outcome.   REHAB POTENTIAL: Fair time since onset  CLINICAL DECISION MAKING: Evolving/moderate complexity  EVALUATION COMPLEXITY: Moderate  PLAN:  PT FREQUENCY: 2x/week  PT DURATION: 10 weeks  PLANNED INTERVENTIONS: Therapeutic exercises, Therapeutic activity, Neuromuscular re-education, Balance training, Gait training, Patient/Family education, Self Care, Joint mobilization, Stair training, Vestibular training, Visual/preceptual remediation/compensation, Orthotic/Fit training, DME instructions, Aquatic Therapy, Cognitive remediation, Electrical stimulation, Wheelchair mobility training, Taping, Manual therapy, and Re-evaluation  PLAN FOR NEXT SESSION: did this bring AFOs? Attempt standing? Bed mobility?    Jill Alexanders Ariyel Jeangilles, PT, DPT 10/02/2022, 1:11 PM

## 2022-10-04 ENCOUNTER — Ambulatory Visit: Payer: Medicare Other | Admitting: Physical Therapy

## 2022-10-09 ENCOUNTER — Telehealth (INDEPENDENT_AMBULATORY_CARE_PROVIDER_SITE_OTHER): Payer: Self-pay

## 2022-10-09 ENCOUNTER — Ambulatory Visit: Payer: Medicare Other | Admitting: Physical Therapy

## 2022-10-09 NOTE — Telephone Encounter (Signed)
After speaking to [place], a representative of [facility], she stated that their patient care coordination team may not have gotten to this pt's referral yet as they have a long list, and it would probably be easier for the pt to call and schedule.  Contacted pt's mother to make her aware of this a provided [facility]'s phone number.  Mother verbalized understanding of this.  SS, CCMA

## 2022-10-11 ENCOUNTER — Ambulatory Visit: Payer: Medicare Other | Admitting: Physical Therapy

## 2022-10-16 ENCOUNTER — Ambulatory Visit: Payer: Medicare Other

## 2022-10-18 ENCOUNTER — Ambulatory Visit: Payer: Medicare Other

## 2022-10-23 ENCOUNTER — Ambulatory Visit: Payer: Medicare Other | Attending: Internal Medicine

## 2022-10-23 DIAGNOSIS — R2689 Other abnormalities of gait and mobility: Secondary | ICD-10-CM | POA: Insufficient documentation

## 2022-10-23 DIAGNOSIS — R293 Abnormal posture: Secondary | ICD-10-CM | POA: Insufficient documentation

## 2022-10-23 DIAGNOSIS — R262 Difficulty in walking, not elsewhere classified: Secondary | ICD-10-CM | POA: Diagnosis not present

## 2022-10-23 DIAGNOSIS — M6281 Muscle weakness (generalized): Secondary | ICD-10-CM | POA: Insufficient documentation

## 2022-10-23 NOTE — Therapy (Signed)
OUTPATIENT PHYSICAL THERAPY NEURO TREATMENT   Patient Name: Theodore Lewis MRN: [id number] DOB:Jul 08, 1996, 26 y.o., male Today's Date: 10/23/2022   PCP: Elveria Rising, NP  REFERRING PROVIDER: Elveria Rising, NP    PT End of Session - 10/23/22 1223     Visit Number 5    Number of Visits 21    Date for PT Re-Evaluation 11/29/22    Authorization Type [employer] medicare/medicaid    Progress Note Due on Visit 10    PT Start Time 1230    PT Stop Time 1315    PT Time Calculation (min) 45 min    Activity Tolerance Patient tolerated treatment well    Behavior During Therapy [facility] for tasks assessed/performed;Restless               Past Medical History:  Diagnosis Date   Anxiety    Cerebral palsy (HCC)    Congenital quadriparesis (HCC)    Neuromuscular disorder (HCC)    Seizure (HCC)    Seizures (HCC)    last seizure 3 yrs ago   Past Surgical History:  Procedure Laterality Date   BRAIN SURGERY     Shunt placed when he was a yr. old   I & D EXTREMITY Bilateral 04/29/2014   Procedure: IRRIGATION AND DEBRIDEMENT EXTREMITY;  Surgeon: Eldred Manges, MD;  Location: [facility] OR;  Service: Orthopedics;  Laterality: Bilateral;  Right and Left Hamstring Lenghening and Fiberglass Casting   LEG TENDON SURGERY Bilateral approx 6 yrs ago   Patient Active Problem List   Diagnosis Date Noted   Dysphagia 08/09/2022   Protein-calorie malnutrition (HCC) 08/09/2022   Picky eater 08/09/2022   Decreased motor strength 11/12/2021   Thrombocytopenia (HCC) 08/02/2021   Seasonal allergic rhinitis due to pollen 08/02/2021   Cachexia (HCC) 08/02/2021   Loss of weight 09/22/2020   Oppositional behavior 09/22/2020   Restlessness and agitation 04/20/2015   Moderate intellectual disabilities 02/24/2013   History of congenital brain abnormality 02/24/2013   Obstructive hydrocephalus (HCC) 02/24/2013   Congenital quadriplegia (HCC) 02/24/2013   Generalized convulsive epilepsy (HCC) 02/24/2013    Partial epilepsy with impairment of consciousness (HCC) 02/24/2013    ONSET DATE: 08/14/2022 referral  REFERRING DIAG: G80.8 (ICD-10-CM) - Congenital quadriplegia (HCC)   THERAPY DIAG:  Abnormal posture  Other abnormalities of gait and mobility  Difficulty in walking, not elsewhere classified  Muscle weakness (generalized)  Rationale for Evaluation and Treatment: Rehabilitation  SUBJECTIVE:  SUBJECTIVE STATEMENT: Patient presents with mother- reports that she's been "doing nothing" with patient since last here. He has not worn his AFOs and mom has not been doing stretches. Denies falls/near falls.   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  Did not indicate pain   PRECAUTIONS: Fall and Other: seizure   PATIENT GOALS: per parents: to be able to pull to stand and transfer in/out of bed/wheelchair/couch  OBJECTIVE:   MUSCLE TONE: LLE: adduction 2+ MAS; abduction 2+ MAS RLE: adduction 2+ MAS; abduction 2+ MAS  MUSCLE LENGTH: Hamstrings: Right ~30 deg; Left ~30 deg  TODAY'S TREATMENT:  Self care/home management:  -discussed with mom difference between patients physical ability/inability to complete certain transfers vs behavioral influences  -this PT believes that it's more behavioral  -mom asking PT for sneakers that will fit his AFOs   -PT recommending that they bring his AFOs  shoe shopping with them   -mom stating that previous PT bought the sneakers for the patient  -provided mom and patient with recourses on [facility] and ABA centers -Discussed that if HEP is not adhered to then it may be difficult to continue with PT as no progress will be seen   PATIENT EDUCATION: Education details: See above Person educated: Patient and Parent Education method: Explanation, Demonstration, and Handouts Education comprehension: verbalized understanding and needs further education  HOME EXERCISE PROGRAM: Access Code: [email or web address] URL: [email or web address] Date: 09/18/2022 Prepared by: Merry Lofty  Exercises - Supine Ankle Dorsiflexion Stretch with Caregiver  - 1 x daily - 7 x weekly - 3 sets - 1 min hold - Hip Abduction and Adduction Caregiver PROM  - 1 x daily - 7 x weekly - 3 sets - 1 min hold - Seated Hamstring Stretch  - 1 x daily - 7 x weekly - 3 sets - 1 min hold  GOALS: Goals reviewed with patient? Yes  SHORT TERM GOALS: Target date: 10/11/2022  Patient will complete HEP at least 3x a week with assist from family Baseline: to be provided Goal status: INITIAL  2.  Mother will demonstrate safe transfer mechanics for bed <> wc, ModI Baseline: requiring increased cuing for safety Goal status: INITIAL  3.  Patient will initiate standing with LRAD and assist as needed Baseline: did not stand at eval Goal status: INITIAL  4.  Patient will initiate pull to stand motion with LRAD and assist as needed Baseline: did not initiate on eval Goal status: INITIAL  5.  Patient will improve R knee extension to </= 20* from full extension Baseline: ~30* Goal status: INITIAL  6.  Patient will improve L knee extension to </= 20* from full extension Baseline: ~30* Goal status: INITIAL  LONG TERM GOALS: Target date: 11/29/2022  Patient will complete final HEP at least 3x a week with assist from family Baseline: to be provided Goal status:  INITIAL  Patient will participate in bed <> wc transfer with LRAD Baseline: Requiring TotalA from mother Goal status: INITIAL  Patient will stand with LRAD and assist as needed for at least 1 min Baseline: did not stand at eval Goal status: INITIAL  Patient will initiate gait training with  LRAD and assist as needed Baseline: did not initiate on eval Goal status: INITIAL  Patient will improve R knee extension to </= 10* from full extension Baseline: ~30* Goal status: INITIAL  6. Patient will improve L knee extension to </= 10* from full extension Baseline: ~30* Goal status: INITIAL  ASSESSMENT:  CLINICAL IMPRESSION: Patient seen for skilled PT session with emphasis on patient and parent education. Providing mom with multiple handouts and resources to contact with therapies that may be beneficial to patient. Patient continues to only want to complete laps in the wc around the gym with PT pushing him- engaging in minimal wc mobility himself. Continue POC as able.   OBJECTIVE IMPAIRMENTS: Abnormal gait, decreased balance, decreased cognition, decreased coordination, decreased knowledge of condition, decreased knowledge of use of DME, decreased mobility, difficulty walking, decreased ROM, decreased strength, hypomobility, impaired flexibility, impaired tone, impaired UE functional use, impaired vision/preception, improper body mechanics, and postural dysfunction.   ACTIVITY LIMITATIONS: sitting, standing, transfers, bed mobility, bathing, dressing, and locomotion level  PARTICIPATION LIMITATIONS: meal prep, interpersonal relationship, community activity, occupation, and school  PERSONAL FACTORS: Behavior pattern, Education, Past/current experiences, Time since onset of injury/illness/exacerbation, Transportation, and 1-2 comorbidities: seizures, congenital quadriplegia   are also affecting patient's functional outcome.   REHAB POTENTIAL: Fair time since onset  CLINICAL DECISION  MAKING: Evolving/moderate complexity  EVALUATION COMPLEXITY: Moderate  PLAN:  PT FREQUENCY: 2x/week  PT DURATION: 10 weeks  PLANNED INTERVENTIONS: Therapeutic exercises, Therapeutic activity, Neuromuscular re-education, Balance training, Gait training, Patient/Family education, Self Care, Joint mobilization, Stair training, Vestibular training, Visual/preceptual remediation/compensation, Orthotic/Fit training, DME instructions, Aquatic Therapy, Cognitive remediation, Electrical stimulation, Wheelchair mobility training, Taping, Manual therapy, and Re-evaluation  PLAN FOR NEXT SESSION: GOAL CHECK- basically have mom demonstrate how she transfers him and performs HEP   Westley Foots, PT, DPT Westley Foots, PT, DPT, CBIS  10/23/2022, 1:25 PM

## 2022-10-25 ENCOUNTER — Ambulatory Visit: Payer: Medicare Other | Admitting: Physical Therapy

## 2022-10-25 DIAGNOSIS — M6281 Muscle weakness (generalized): Secondary | ICD-10-CM

## 2022-10-25 DIAGNOSIS — R2689 Other abnormalities of gait and mobility: Secondary | ICD-10-CM | POA: Diagnosis not present

## 2022-10-25 DIAGNOSIS — R293 Abnormal posture: Secondary | ICD-10-CM | POA: Diagnosis not present

## 2022-10-25 DIAGNOSIS — R262 Difficulty in walking, not elsewhere classified: Secondary | ICD-10-CM | POA: Diagnosis not present

## 2022-10-25 NOTE — Therapy (Signed)
OUTPATIENT PHYSICAL THERAPY NEURO TREATMENT- DISCHARGE SUMMARY   Patient Name: Theodore Lewis MRN: [id number] DOB:11-29-1995, 26 y.o., male Today's Date: 10/25/2022   PCP: Elveria Rising, NP  REFERRING PROVIDER: Elveria Rising, NP   PHYSICAL THERAPY DISCHARGE SUMMARY  Visits from Start of Care: 6  Current functional level related to goals / functional outcomes: Pt requires total A for all mobility and ADLs   Remaining deficits: Quadriplegia, behavioral challenges    Education / Equipment: Stretching program for mother    Patient agrees to discharge. Patient goals were  Discontinued due to pt agitation and non-compliance with stretching at home or therapy . Patient is being discharged due to  agitation and non-compliance w/therapy.    PT End of Session - 10/25/22 1238     Visit Number 6    Number of Visits 21    Date for PT Re-Evaluation 11/29/22    Authorization Type [employer] medicare/medicaid    Progress Note Due on Visit 10    PT Start Time 1237   Pt arrived late   PT Stop Time 1255   Pt agitated and throwing objects across gym   PT Time Calculation (min) 18 min    Activity Tolerance Treatment limited secondary to agitation    Behavior During Therapy Restless;Agitated                Past Medical History:  Diagnosis Date   Anxiety    Cerebral palsy (HCC)    Congenital quadriparesis (HCC)    Neuromuscular disorder (HCC)    Seizure (HCC)    Seizures (HCC)    last seizure 3 yrs ago   Past Surgical History:  Procedure Laterality Date   BRAIN SURGERY     Shunt placed when he was a yr. old   I & D EXTREMITY Bilateral 04/29/2014   Procedure: IRRIGATION AND DEBRIDEMENT EXTREMITY;  Surgeon: Eldred Manges, MD;  Location: [facility] OR;  Service: Orthopedics;  Laterality: Bilateral;  Right and Left Hamstring Lenghening and Fiberglass Casting   LEG TENDON SURGERY Bilateral approx 6 yrs ago   Patient Active Problem List   Diagnosis Date Noted   Dysphagia 08/09/2022    Protein-calorie malnutrition (HCC) 08/09/2022   Picky eater 08/09/2022   Decreased motor strength 11/12/2021   Thrombocytopenia (HCC) 08/02/2021   Seasonal allergic rhinitis due to pollen 08/02/2021   Cachexia (HCC) 08/02/2021   Loss of weight 09/22/2020   Oppositional behavior 09/22/2020   Restlessness and agitation 04/20/2015   Moderate intellectual disabilities 02/24/2013   History of congenital brain abnormality 02/24/2013   Obstructive hydrocephalus (HCC) 02/24/2013   Congenital quadriplegia (HCC) 02/24/2013   Generalized convulsive epilepsy (HCC) 02/24/2013   Partial epilepsy with impairment of consciousness (HCC) 02/24/2013    ONSET DATE: 08/14/2022 referral  REFERRING DIAG: G80.8 (ICD-10-CM) - Congenital quadriplegia (HCC)   THERAPY DIAG:  Other abnormalities of gait and mobility  Muscle weakness (generalized)  Rationale for Evaluation and Treatment: Rehabilitation  SUBJECTIVE:  SUBJECTIVE STATEMENT: Patient presents with mother- reports that they have "not been doing anything" at home. "I don't even know which stretches you gave him nor how to do them". Pt very agitated today and immediately attempting to grab therapist.   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  Did not indicate pain   PRECAUTIONS: Fall and Other: seizure   PATIENT GOALS: per parents: to be able to pull to  stand and transfer in/out of bed/wheelchair/couch  OBJECTIVE:   MUSCLE TONE: LLE: adduction 2+ MAS; abduction 2+ MAS RLE: adduction 2+ MAS; abduction 2+ MAS  MUSCLE LENGTH: Hamstrings: Right ~30 deg; Left ~30 deg  TODAY'S TREATMENT:  Ther Act  Had mother transfer pt to mat and noted no carryover from transfer training in previous sessions, as mother carrying pt far distance from chair to mat rather than setting up squat pivot transfer per previous education. Mother able to verbalize the transfer being incorrect but did not attempt to fix it Reviewed HEP with pt's mom and attempted to have her demonstrate stretches w/pt on mat. Pt did not tolerate stretches and threw large binder clip aggressively across treatment gym. Attempted to educate mother on proper hand placement and purpose of each stretch (reviewed in previous sessions) but mother demonstrates no carryover.  Mother carried pt from mat to power chair and left early due to pt aggression. Mother in agreement to DC from PT at this time due to behavioral challenges and lack of compliance at home. All goals discontinued.   PATIENT EDUCATION: Education details: See above Person educated: Patient and Parent Education method: Explanation, Demonstration, and Handouts Education comprehension: verbalized understanding and needs further education  HOME EXERCISE PROGRAM: Access Code: [email or web address] URL: [email or web address] Date: 09/18/2022 Prepared by: Merry Lofty  Exercises - Supine Ankle Dorsiflexion Stretch with Caregiver  - 1 x daily - 7 x weekly - 3 sets - 1 min hold - Hip Abduction and Adduction Caregiver PROM  - 1 x daily - 7 x weekly - 3 sets - 1 min hold - Seated Hamstring Stretch  - 1 x daily - 7 x weekly - 3 sets - 1 min hold  GOALS: Goals reviewed with patient? Yes  ALL GOALS DISCONTINUED DUE TO PT AGGRESSION AND LACK OF COMPLIANCE AT HOME  SHORT TERM GOALS: Target date: 10/11/2022  Patient will complete HEP  at least 3x a week with assist from family Baseline: to be provided Goal status: INITIAL  2.  Mother will demonstrate safe transfer mechanics for bed <> wc, ModI Baseline: requiring increased cuing for safety Goal status: INITIAL  3.  Patient will initiate standing with LRAD and assist as needed Baseline: did not stand at eval Goal status: INITIAL  4.  Patient will initiate pull to stand motion with LRAD and assist as needed Baseline: did not initiate on eval Goal status: INITIAL  5.  Patient will improve R knee extension to </= 20* from full extension Baseline: ~30* Goal status: INITIAL  6.  Patient will improve L knee extension to </= 20* from full extension Baseline: ~30* Goal status: INITIAL  LONG TERM GOALS: Target date: 11/29/2022  Patient will complete final HEP at  least 3x a week with assist from family Baseline: to be provided Goal status: INITIAL  Patient will participate in bed <> wc transfer with LRAD Baseline: Requiring TotalA from mother Goal status: INITIAL  Patient will stand with LRAD and assist as needed for at least 1 min Baseline: did not stand at eval Goal status: INITIAL  Patient will initiate gait training with LRAD and assist as needed Baseline: did not initiate on eval Goal status: INITIAL  Patient will improve R knee extension to </= 10* from full extension Baseline: ~30* Goal status: INITIAL  6. Patient will improve L knee extension to </= 10* from full extension Baseline: ~30* Goal status: INITIAL  ASSESSMENT:  CLINICAL IMPRESSION: Emphasis of skilled PT session on caregiver education and DC from PT. At this time, pt's mother reports she has not performed any stretches or proper transfers at home despite repeated education from therapists. Pt very aggressive during session, attempting to hit the therapist and mother and throwing items across gym. Due to pt's behavior and lack of compliance at home, therapist discharging pt. Provided  mother with behavioral therapy resources and strongly encouraged reaching out to PCP for care aide assistance. Mother verbalized understanding and is in agreement with DC at this time.   OBJECTIVE IMPAIRMENTS: Abnormal gait, decreased balance, decreased cognition, decreased coordination, decreased knowledge of condition, decreased knowledge of use of DME, decreased mobility, difficulty walking, decreased ROM, decreased strength, hypomobility, impaired flexibility, impaired tone, impaired UE functional use, impaired vision/preception, improper body mechanics, and postural dysfunction.   ACTIVITY LIMITATIONS: sitting, standing, transfers, bed mobility, bathing, dressing, and locomotion level  PARTICIPATION LIMITATIONS: meal prep, interpersonal relationship, community activity, occupation, and school  PERSONAL FACTORS: Behavior pattern, Education, Past/current experiences, Time since onset of injury/illness/exacerbation, Transportation, and 1-2 comorbidities: seizures, congenital quadriplegia   are also affecting patient's functional outcome.   REHAB POTENTIAL: Fair time since onset  CLINICAL DECISION MAKING: Evolving/moderate complexity  EVALUATION COMPLEXITY: Moderate  PLAN:  PT FREQUENCY: 2x/week  PT DURATION: 10 weeks  PLANNED INTERVENTIONS: Therapeutic exercises, Therapeutic activity, Neuromuscular re-education, Balance training, Gait training, Patient/Family education, Self Care, Joint mobilization, Stair training, Vestibular training, Visual/preceptual remediation/compensation, Orthotic/Fit training, DME instructions, Aquatic Therapy, Cognitive remediation, Electrical stimulation, Wheelchair mobility training, Taping, Manual therapy, and Re-evaluation   Kristianna Saperstein E Eshaal Duby, PT, DPT 10/25/2022, 12:56 PM

## 2022-10-30 ENCOUNTER — Ambulatory Visit: Payer: Medicare Other | Admitting: Physical Therapy

## 2022-10-31 NOTE — Progress Notes (Signed)
Medical Nutrition Therapy - Progress Note Appt start time: 11:30 AM Appt end time: 12:15 PM Reason for referral: Loss of weight Referring provider: Elveria Rising, NP - Neuro Pertinent medical hx: obstructive hydrocephalus, congenital quadriplegia, epilepsy, thrombocytopenia, moderate intellectual disabilities, hx of congenital brain abnormality, loss of weight, cachexia, decreased motor strength  Assessment: Food allergies: none known Pertinent Medications: see medication list - pepcid Vitamins/Supplements: none Pertinent labs:  (9/29) CBC: WBC - 2.5 (low), Platelet Count - 76 (low)  (1/11) Anthropometrics: Ht: 155 cm  Wt: 33.4 kg (73 lb 12.8 oz) BMI: 13.9  IBW based on [place] Equation: 50 kg (+/- 10%)  08/08/22 Wt: 74 #  05/03/22 Wt: 74# 04/02/22 Wt: 62# 11/12/21 Wt: 70#  08/02/21 Wt: 60 # 05/21/21 Wt: 74 # 9.6 oz  Estimated minimum caloric needs: 1720 kcal/day (CP non-ambulatory - 11.1 kcal/cm)  Estimated minimum protein needs: 0.8 g/kg/day (DRI) Estimated minimum fluid needs: 1720 mL/kg/day (1 mL/kcal)   Primary concerns today: Follow-up given pt with loss of weight. Mom and dad accompanied pt to appt today.   Dietary Intake Hx: DME: [facility], fax: [phone] Usual eating pattern includes: 3 meals and 2-3 snacks per day.  Feeding skills: finger feeding, drinking via straw    Preferred foods: chicken nuggets ([employer]endy's or food lion brand), pudding (chocolate, banana cream, butterscotch), oatmeal with sugar/butter/cream, shrimp, french fries, corn chips, doritos, crispy cream doughnuts Avoided foods: all other foods   Chewing/swallowing difficulties with foods or liquids: occasionally gagging with with foods and liquids, swallowing food whole Texture modifications: chopped, blended   24-hr recall: Breakfast (8 AM): 8 chicken nuggets (cut in half) + 1 cup pudding + soda/water Snack: hashbrown from [employer] (didn't eat it) Lunch (11-12 PM): 8-12 piece chicken nuggets  (Wendy's) + small amount of chips/french fries  Snack: chips Dinner (6:30 PM): 8 chicken nuggets (cut in half) + 1 cup pudding + soda/water Snack: none  Typical Beverages: diet soda (5 cans) thinned out with water (8 oz), water (available throughout the day)  Nutrition Supplements: ~1 boost breeze   Notes: Mom notes concern for continued weakening which has led to difficulties with [employer]. Jeoffrey will sign for food when he is hungry up to 6x/day, however when served food he will sometimes not eat it. Zachariah's weakness with eating gradually gets worse throughout the day and mom expresses concern for aspiration pneumonia. Parents feel Hence is always hungry and is likely not eating enough to sustain himself. Parents note that Taft is extremely picky and is even picky when it comes to brands of his accepted foods. Toshiyuki does best when eating at his grandmother's house.   Physical Activity: wheelchair bound  GI: daily (texture changes)  GU: no concern  Estimated intake not meeting needs given poor weight gain and underweight status.  Pt not consuming various food groups.  Pt consuming inadequate amounts of fruits, vegetables, grains and dairy.   Nutrition Diagnosis: (10/5) Increased nutrient needs related to inadequate energy intake as evidenced by underweight status, loss of weight and requiring nutritional supplement to meet nutrient needs.   Intervention: Discussed pt's weight trends and current intake. Discussed recommendations below. All questions answered, family in agreement with plan.   Nutrition Recommendations: - Goal for at least 1 ensure clear or boost breeze per day. It's very important that Zollie gets this daily to ensure he's getting adequate nutrition.  - Limit soda to no more than 1 can per day, offer water, whole milk or nutrition shake instead.  -  Offer Damain something to eat at least every 3 hours to work on his appetite. Praise Trystan when he eats something.   - Limit meals to 30 minutes.  - Increase calories where able. Add 1 tsp of oil or butter to [place]'s foods. Incorporate nuts, seeds, nut butter, avocado, cheese, etc when possible.  - Try giving [place] dips with chicken nuggets or fries to help increase calories.  - I recommend starting a liquid multivitamin, you can find these on [place] or online -- centrum liquid, Alive, [employer] or [employer]    Keep up the good work!   Handouts Given: - GG High Calorie, High Protein Foods  Teach back method used.  Monitoring/Evaluation: Continue to Monitor: - Growth trends  - PO intake  - Need for higher calories nutritional supplement or Duocal  Follow-up in 6 months, joint with Elveria Rising, NP.  Total time spent in counseling: 45 minutes.

## 2022-11-01 ENCOUNTER — Ambulatory Visit: Payer: Medicare Other

## 2022-11-01 ENCOUNTER — Other Ambulatory Visit (INDEPENDENT_AMBULATORY_CARE_PROVIDER_SITE_OTHER): Payer: Self-pay | Admitting: Family

## 2022-11-01 DIAGNOSIS — G40309 Generalized idiopathic epilepsy and epileptic syndromes, not intractable, without status epilepticus: Secondary | ICD-10-CM

## 2022-11-01 DIAGNOSIS — G40209 Localization-related (focal) (partial) symptomatic epilepsy and epileptic syndromes with complex partial seizures, not intractable, without status epilepticus: Secondary | ICD-10-CM

## 2022-11-01 NOTE — Telephone Encounter (Signed)
Seen 10/5 follow up is 11/14/22 last ordered 04/2021

## 2022-11-06 ENCOUNTER — Ambulatory Visit: Payer: Medicare Other | Admitting: Physical Therapy

## 2022-11-08 ENCOUNTER — Ambulatory Visit: Payer: Medicare Other

## 2022-11-12 ENCOUNTER — Encounter: Payer: Self-pay | Admitting: Family

## 2022-11-13 ENCOUNTER — Ambulatory Visit: Payer: Medicare Other

## 2022-11-14 ENCOUNTER — Ambulatory Visit (INDEPENDENT_AMBULATORY_CARE_PROVIDER_SITE_OTHER): Payer: 59 | Admitting: Family

## 2022-11-14 ENCOUNTER — Ambulatory Visit (INDEPENDENT_AMBULATORY_CARE_PROVIDER_SITE_OTHER): Payer: 59 | Admitting: Dietician

## 2022-11-14 ENCOUNTER — Encounter (INDEPENDENT_AMBULATORY_CARE_PROVIDER_SITE_OTHER): Payer: Self-pay | Admitting: Family

## 2022-11-14 VITALS — HR 88 | Ht 59.7 in | Wt 73.8 lb

## 2022-11-14 DIAGNOSIS — R634 Abnormal weight loss: Secondary | ICD-10-CM | POA: Diagnosis not present

## 2022-11-14 DIAGNOSIS — G808 Other cerebral palsy: Secondary | ICD-10-CM | POA: Diagnosis not present

## 2022-11-14 DIAGNOSIS — G911 Obstructive hydrocephalus: Secondary | ICD-10-CM | POA: Diagnosis not present

## 2022-11-14 DIAGNOSIS — R933 Abnormal findings on diagnostic imaging of other parts of digestive tract: Secondary | ICD-10-CM | POA: Diagnosis not present

## 2022-11-14 DIAGNOSIS — R638 Other symptoms and signs concerning food and fluid intake: Secondary | ICD-10-CM | POA: Insufficient documentation

## 2022-11-14 DIAGNOSIS — R6339 Other feeding difficulties: Secondary | ICD-10-CM

## 2022-11-14 DIAGNOSIS — R636 Underweight: Secondary | ICD-10-CM | POA: Insufficient documentation

## 2022-11-14 DIAGNOSIS — E46 Unspecified protein-calorie malnutrition: Secondary | ICD-10-CM

## 2022-11-14 DIAGNOSIS — G40309 Generalized idiopathic epilepsy and epileptic syndromes, not intractable, without status epilepticus: Secondary | ICD-10-CM

## 2022-11-14 DIAGNOSIS — R1310 Dysphagia, unspecified: Secondary | ICD-10-CM

## 2022-11-14 DIAGNOSIS — F71 Moderate intellectual disabilities: Secondary | ICD-10-CM

## 2022-11-14 DIAGNOSIS — R4689 Other symptoms and signs involving appearance and behavior: Secondary | ICD-10-CM

## 2022-11-14 DIAGNOSIS — G40209 Localization-related (focal) (partial) symptomatic epilepsy and epileptic syndromes with complex partial seizures, not intractable, without status epilepticus: Secondary | ICD-10-CM

## 2022-11-14 NOTE — Progress Notes (Signed)
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 This was treated with VP shunt. He has resultant spastic quadriparesis, contractures, poor vision, evidence of an axial collection of mixed type in the right frontal-temporal-parietal region and possible infarction of the right brain with encephalomalacia of the left brain, as well as intermittent episode of agitation. He is taking and tolerating Depakote and Carbatrol for his seizure disorder. He is cared for at home by his parents, but his father has significant health problems that is limiting his ability to provide physical care   Due to his medical condition, he is indefinitely incontinent of stool and urine.  It is medically necessary for him to use diapers, underpads, and gloves to assist with hygiene and skin integrity.     Today's concerns: Has remained seizure free since last visit Continues to be a picky eater, tends to refuse foods offered to him.  Drinks soft drinks, sometimes watered down to half strength all during the day Mom admits to being overwhelmed at times with caring for him and his father, who has a neuromuscular disorder Had home modifications done in the last year that has helped some with providing care Theodore Lewis has been otherwise generally healthy since he was last seen. No health concerns today other than previously mentioned.  Review of systems: Please see HPI for neurologic and other pertinent review of systems. Otherwise all other systems were reviewed and were negative.  Problem  List: Patient Active Problem List   Diagnosis Date Noted   Dysphagia 08/09/2022   Protein-calorie malnutrition (HCC) 08/09/2022   Picky eater 08/09/2022   Decreased motor strength 11/12/2021   Thrombocytopenia (HCC) 08/02/2021   Seasonal allergic rhinitis due to pollen 08/02/2021   Cachexia (HCC) 08/02/2021   Loss of weight 09/22/2020   Oppositional behavior 09/22/2020   Restlessness and agitation 04/20/2015   Moderate intellectual disabilities 02/24/2013   History of congenital brain abnormality 02/24/2013   Obstructive hydrocephalus (HCC) 02/24/2013   Congenital quadriplegia (HCC) 02/24/2013   Generalized convulsive epilepsy (HCC) 02/24/2013   Partial epilepsy with impairment of consciousness (HCC) 02/24/2013     Past Medical History:  Diagnosis Date   Anxiety    Cerebral palsy (HCC)    Congenital quadriparesis (HCC)    Neuromuscular disorder (HCC)    Seizure (HCC)    Seizures (HCC)    last seizure 3 yrs ago    Past medical history comments: See HPI Copied from previous record: He presented at few months of age with massive hydrocephalus.  He required urgent placement of a ventriculoperitoneal shunt.  He has experienced revisions in the past.  Brain images in 2007 showed an extra-axial collection of mixed type in the right frontotemporal parietal region with possible infarction in the right brain and significant encephalomalacia of the left brain.  Ventricles were well decompressed.   A CT scan of the brain and shunt series Mar 04, 2013 showed microcephaly with marked diffuse thickening of the calvarium and marked enlargement of the mastoid sinus bilaterally. Left frontal shunt catheter extends to the region of the third ventricle and is  unchanged. No hydrocephalus. There is cerebellar atrophy bilaterally. 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 Brainstem is small. There is atrophy of the left occipital lobe. Agenesis of the corpus callosum.    Large extra-axial fluid collection on the right measures 4.6 x 11.1 cm. This has low density centrally and the wall has calcified since the prior study. The fluid collection has matured and become better defined since the prior study. There is some mass effect on the right cerebral hemisphere. No midline shift to the left. No acute hemorrhage. Shunt tubing was intact from the ventricles to the abdomen.   Surgical history: Past Surgical History:  Procedure Laterality Date   BRAIN SURGERY     Shunt placed when he was a yr. old   I & D EXTREMITY Bilateral 04/29/2014   Procedure: IRRIGATION AND DEBRIDEMENT EXTREMITY;  Surgeon: Marybelle Killings, MD;  Location: [facility];  Service: Orthopedics;  Laterality: Bilateral;  Right and Left Hamstring Lenghening and Fiberglass Casting   LEG TENDON SURGERY Bilateral approx 6 yrs ago     Family history: family history includes Cancer in his paternal grandfather and paternal grandmother; Diabetes in his father and mother; Early death in his paternal grandfather and paternal grandmother; Healthy in his mother; Heart disease in his father, maternal grandfather, and mother; Hyperlipidemia in his father, maternal grandfather, and mother; Hypertension in his father, maternal grandfather, and maternal grandmother; Intellectual disability in his maternal grandfather; Learning disabilities in his father; Stomach cancer in his paternal grandfather and paternal grandmother; Throat cancer in his paternal grandfather.   Social history: Social History   Socioeconomic History   Marital status: Single    Spouse name: Not on file   Number of children: Not on file   Years of education: Not on file   Highest education level: Not on file  Occupational History   Not on file  Tobacco Use   Smoking status: Never    Passive exposure: Never   Smokeless tobacco: Never  Substance and Sexual Activity   Alcohol use: No   Drug use: No   Sexual activity: Never  Other Topics Concern    Not on file  Social History Narrative   Theodore Lewis live at home with mom and dad.   He enjoys going outside, and riding.    Social Determinants of Health   Financial Resource Strain: Not on file  Food Insecurity: Not on file  Transportation Needs: Not on file  Physical Activity: Not on file  Stress: Not on file  Social Connections: Not on file  Intimate Partner Violence: Not on file    Past/failed meds:  Allergies: Allergies  Allergen Reactions   Penicillins Other (See Comments)    Right side of body started shaking     Vancomycin Rash    Immunizations: Immunization History  Administered Date(s) Administered   Influenza-Unspecified 07/24/2021   PFIZER(Purple Top)SARS-COV-2 Vaccination 03/13/2020, 04/04/2020, 10/05/2020    Diagnostics/Screenings: Copied from previous record: 08/22/2022 - Barium Swallow - Limited study as pt was apprehensive to swallow the barium *despite it being sweetened with artificial sweetener* and several options provided.  In addition, suboptimal view due to pt leaning forward out of flouro view. Patient's mother had to help feed Vonna Kotyk - as he would only accept *reluctantly* intake from her.  Mild oral dysphagia most c/b impaired oral control resulting in premature spillage of boluses into pharynx and lack of adequate mastication.  Pt did not adequate masticate with sweet potato despite mom  rubbing his chin in effort to induce mastication. Minimal vertical mastication observed with pt and prolonged lingual press to palate to transit bolus into pharynx it- consistent with intellectual disability. Pudding bolus rapidly spilled (dumped) into pharynx with swallow triggering at vallecular space.  SLP had pt use his bottle/straw during the test - with him accepting only small sips but adequate airway protection.  Premature spillage to pyriform sinus with thin observed prior to swallow trigger.     Pharyngeal swallow was strong without any retention fortunately.   Sony did not cough or gag during po trials of MBS - but he only accepted approx. 7 boluses total.       Upon esophageal sweep, pt noted to be kyphotic - which raises concerns for potential multifactorial dysphagia including esophageal issues or GI issues contributing to gagging.  Please see video loops of esophageal sweep.      03/12/2021 - CT head wo contrast - 1. Outside of the chest, a second new discontinuity of the shunt tubing is identified overlying the left mastoid. 2. But there is no ventriculomegaly. And the non contrast CT appearance of the brain is stable since 2018.   05/23/2017 - CT head wo contrast - No acute finding. Chronic microcephaly and brain atrophy. Chronic calcified subdural collection on the right, not significantly changed since 03/04/2013. No evidence of shunt malfunction. Sinuses clear  Physical Exam: Wt 73 lb 12.8 oz (33.5 kg)   BMI 13.91 kg/m   Wt Readings from Last 3 Encounters:  11/14/22 73 lb 12.8 oz (33.5 kg)  08/08/22 74 lb (33.6 kg)  05/03/22 74 lb (33.6 kg)    General: thin but well developed, well nourished young man, seated in wheelchair, in no evident distress Head: normocephalic and atraumatic. Oropharynx benign. No dysmorphic features. Neck: supple Cardiovascular: regular rate and rhythm, no murmurs. Respiratory: clear to auscultation bilaterally Abdomen: bowel sounds present all four quadrants, abdomen soft, non-tender, non-distended. No hepatosplenomegaly or masses palpated Musculoskeletal: no skeletal deformities. Hs kyphoscoliosis. Has muscle wasting and flexion contractures Skin: no rashes or neurocutaneous lesions  Neurologic Exam Mental Status: awake and fully alert. Has no spoken language but does have some signs. Impassive face. Resistant to invasions into his space Cranial Nerves: fundoscopic exam - red reflex present.  Unable to fully visualize fundus.  Pupils equal briskly reactive to light.  Turns to localize faces and objects in  the periphery. Turns to localize sounds in the periphery. Facial movements are symmetric. Motor: spastic quadriparesis  Sensory: withdrawal x 4 Coordination: unable to adequately assess due to patient's inability to participate in examination. No dysmetria when reaching for objects. Gait and Station: unable to stand and bear weight.  Impression: Loss of weight [R63.4]  Dysphagia, unspecified type  Abnormal barium swallow  Congenital quadriplegia (HCC)  Generalized convulsive epilepsy ([place])  Partial epilepsy with impairment of consciousness ([place])  Increased nutritional needs  Underweight  Moderate intellectual disabilities  Protein-calorie malnutrition, unspecified severity ([place])  Obstructive hydrocephalus ([place])  Oppositional behavior  Picky eater [R63.39]   Recommendations for plan of care: The patient's previous Epic records were reviewed. Recent diagnostic studies were reviewed with the patient's parents. He had a swallow study in October that was suboptimal but did reveal kyphosis. He has been referred to GI but does not yet have an appointment.   I talked with Lowery's parents at some length about his weight loss and explained that they need to follow the recommendations given by the dietician or that the next step  will be referring Dayle for a feeding tube. They were both opposed to that but agreed to work with him more about feedings.  Plan until next visit: Continue medications as prescribed  Follow recommendations by dietician Follow up on referral to [facility].  Call seizures occur Return in about 6 months (around 05/15/2023).  The medication list was reviewed and reconciled. No changes were made in the prescribed medications today. A complete medication list was provided to the patient.  Allergies as of 11/14/2022       Reactions   Penicillins Other (See Comments)   Right side of body started shaking    Vancomycin Rash        Medication List         Accurate as of November 14, 2022 11:59 PM. If you have any questions, ask your nurse or doctor.          carbamazepine 300 MG 12 hr capsule Commonly known as: Carbatrol Take 1 capsule (300 mg total) by mouth 2 (two) times daily.   cetirizine HCl 5 MG/5ML Soln Commonly known as: Zyrtec Take 10 mLs (10 mg total) by mouth daily.   Depakote Sprinkles 125 MG capsule Generic drug: divalproex TAKE 5 CAPSULES BY MOUTH TWICE DAILY   diazepam 1 MG/ML solution Commonly known as: VALIUM Give 1 ml at bedtime and give 92ml by mouth up to twice per day as needed for restlessness   diazepam 10 MG Gel Commonly known as: DIASTAT ACUDIAL INSERT 10MG  RECTALLY AT ONSET OF SEIZURE   famotidine 40 MG tablet Commonly known as: PEPCID Take 40 mg by mouth daily.   famotidine 40 MG/5ML suspension Commonly known as: PEPCID Take 5 mLs (40 mg total) by mouth at bedtime.   Nutritional Supplement Plus Liqd 1 Ensure Clear or Boost Breeze given PO daily.      I discussed this patient's care with the dietician involved in his care today to develop this assessment and plan.  Total time spent with the patient was 60 minutes, of which 50% or more was spent in counseling and coordination of care.  NP-C [facility] [address] [place], [place] Ph. [phone] Fax [phone]

## 2022-11-14 NOTE — Patient Instructions (Signed)
Nutrition Recommendations: - Goal for at least 1 ensure clear or boost breeze per day. It's very important that Theodore Lewis gets this daily to ensure he's getting adequate nutrition.  - Limit soda to no more than 1 can per day, offer water, whole milk or nutrition shake instead.  - Offer Theodore Lewis something to eat at least every 3 hours to work on his appetite. Praise Theodore Lewis when he eats something.  - Limit meals to 30 minutes.  - Increase calories where able. Add 1 tsp of oil or butter to Theodore Lewis's foods. Incorporate nuts, seeds, nut butter, avocado, cheese, etc when possible.  - Try giving Theodore Lewis dips with chicken nuggets or fries to help increase calories.  - I recommend starting a liquid multivitamin, you can find these on [place] or online -- centrum liquid, [employer], [employer] or [employer].    Keep up the good work!

## 2022-11-14 NOTE — Patient Instructions (Signed)
It was a pleasure to see you today!  Instructions for you until your next appointment are as follows: Work on the feeding plans that we discussed and be sure to follow the instructions by the dietician today.  Call [facility] at [phone] to make an appointment with the GI provider Call me if Viyaan has seizures or if you have any questions or concerns. Please sign up for MyChart if you have not done so. Please plan to return for follow up in 6 months or sooner if needed.   Feel free to contact our office during normal business hours at [phone] with questions or concerns. If there is no answer or the call is outside business hours, please leave a message and our clinic staff will call you back within the next business day.  If you have an urgent concern, please stay on the line for our after-hours answering service and ask for the on-call neurologist.     I also encourage you to use MyChart to communicate with me more directly. If you have not yet signed up for MyChart within [facility], the front desk staff can help you. However, please note that this inbox is NOT monitored on nights or weekends, and response can take up to 2 business days.  Urgent matters should be discussed with the on-call pediatric neurologist.   At [facility], we are committed to providing exceptional care. You will receive a patient satisfaction survey through text or email regarding your visit today. Your opinion is important to me. Comments are appreciated.

## 2022-11-15 ENCOUNTER — Ambulatory Visit: Payer: Medicare Other | Admitting: Physical Therapy

## 2022-11-19 ENCOUNTER — Encounter (INDEPENDENT_AMBULATORY_CARE_PROVIDER_SITE_OTHER): Payer: Self-pay | Admitting: Family

## 2022-11-20 ENCOUNTER — Ambulatory Visit: Payer: Medicare Other

## 2022-11-21 ENCOUNTER — Encounter (INDEPENDENT_AMBULATORY_CARE_PROVIDER_SITE_OTHER): Payer: Self-pay

## 2022-11-21 ENCOUNTER — Ambulatory Visit (INDEPENDENT_AMBULATORY_CARE_PROVIDER_SITE_OTHER): Payer: 59 | Admitting: Internal Medicine

## 2022-11-21 ENCOUNTER — Encounter: Payer: Self-pay | Admitting: Internal Medicine

## 2022-11-21 VITALS — BP 122/82 | HR 84 | Ht 59.7 in | Wt <= 1120 oz

## 2022-11-21 DIAGNOSIS — D696 Thrombocytopenia, unspecified: Secondary | ICD-10-CM | POA: Diagnosis not present

## 2022-11-21 DIAGNOSIS — R634 Abnormal weight loss: Secondary | ICD-10-CM

## 2022-11-21 LAB — CBC WITH DIFFERENTIAL/PLATELET
Basophils Absolute: 0 10*3/uL (ref 0.0–0.1)
Basophils Relative: 0.7 % (ref 0.0–3.0)
Eosinophils Absolute: 0 10*3/uL (ref 0.0–0.7)
Eosinophils Relative: 1.2 % (ref 0.0–5.0)
HCT: 45.6 % (ref 39.0–52.0)
Hemoglobin: 15.4 g/dL (ref 13.0–17.0)
Lymphocytes Relative: 27.6 % (ref 12.0–46.0)
Lymphs Abs: 0.9 10*3/uL (ref 0.7–4.0)
MCHC: 33.7 g/dL (ref 30.0–36.0)
MCV: 101 fl — ABNORMAL HIGH (ref 78.0–100.0)
Monocytes Absolute: 0.3 10*3/uL (ref 0.1–1.0)
Monocytes Relative: 11 % (ref 3.0–12.0)
Neutro Abs: 1.9 10*3/uL (ref 1.4–7.7)
Neutrophils Relative %: 59.5 % (ref 43.0–77.0)
Platelets: 84 10*3/uL — ABNORMAL LOW (ref 150.0–400.0)
RBC: 4.52 Mil/uL (ref 4.22–5.81)
RDW: 15.4 % (ref 11.5–15.5)
WBC: 3.1 10*3/uL — ABNORMAL LOW (ref 4.0–10.5)

## 2022-11-21 LAB — HEPATIC FUNCTION PANEL
ALT: 22 U/L (ref 0–53)
AST: 26 U/L (ref 0–37)
Albumin: 4.5 g/dL (ref 3.5–5.2)
Alkaline Phosphatase: 84 U/L (ref 39–117)
Bilirubin, Direct: 0.1 mg/dL (ref 0.0–0.3)
Total Bilirubin: 0.4 mg/dL (ref 0.2–1.2)
Total Protein: 7.7 g/dL (ref 6.0–8.3)

## 2022-11-21 LAB — BASIC METABOLIC PANEL
BUN: 10 mg/dL (ref 6–23)
CO2: 25 mEq/L (ref 19–32)
Calcium: 9.3 mg/dL (ref 8.4–10.5)
Chloride: 101 mEq/L (ref 96–112)
Creatinine, Ser: 0.49 mg/dL (ref 0.40–1.50)
GFR: 141.23 mL/min (ref >60.00)
Glucose, Bld: 90 mg/dL (ref 70–99)
Potassium: 4.3 mEq/L (ref 3.5–5.1)
Sodium: 140 mEq/L (ref 135–145)

## 2022-11-21 LAB — VITAMIN B12: Vitamin B-12: 279 pg/mL (ref 211–911)

## 2022-11-21 LAB — FOLATE: Folate: 7.9 ng/mL (ref >5.9)

## 2022-11-21 LAB — TSH: TSH: 3.16 u[IU]/mL (ref 0.35–5.50)

## 2022-11-21 NOTE — Progress Notes (Signed)
Subjective:  Patient ID: Theodore Lewis, male    DOB: Theodore 14, 1997  Age: 27 y.o. MRN: [id number]  CC: Follow-up   HPI Theodore Lewis presents for f/up -  His mother says there has not been any recent bleeding or bruising.  She says she thinks he is losing weight because he does not eat much.  Outpatient Medications Prior to Visit  Medication Sig Dispense Refill   carbamazepine (CARBATROL) 300 MG 12 hr capsule Take 1 capsule (300 mg total) by mouth 2 (two) times daily. 180 capsule 3   cetirizine HCl (ZYRTEC) 5 MG/5ML SOLN Take 10 mLs (10 mg total) by mouth daily. 473 mL 1   DEPAKOTE SPRINKLES 125 MG capsule TAKE 5 CAPSULES BY MOUTH TWICE DAILY 310 capsule 5   diazepam (DIASTAT ACUDIAL) 10 MG GEL INSERT 10MG  RECTALLY AT ONSET OF SEIZURE 1 each 1   diazepam (VALIUM) 1 MG/ML solution Give 1 ml at bedtime and give 3ml by mouth up to twice per day as needed for restlessness 90 mL 5   famotidine (PEPCID) 40 MG tablet Take 40 mg by mouth daily.     famotidine (PEPCID) 40 MG/5ML suspension Take 5 mLs (40 mg total) by mouth at bedtime. 150 mL 2   Nutritional Supplements (NUTRITIONAL SUPPLEMENT PLUS) LIQD 1 Ensure Clear or Boost Breeze given PO daily. 7347 mL 12   No facility-administered medications prior to visit.    ROS Review of Systems  Constitutional:  Positive for appetite change and unexpected weight change.  Respiratory:  Negative for cough and shortness of breath.   Cardiovascular:  Negative for leg swelling.  Gastrointestinal:  Negative for abdominal pain, diarrhea, nausea and vomiting.  Genitourinary: Negative.   Musculoskeletal:  Positive for gait problem.  Skin: Negative.   Neurological:  Positive for speech difficulty and weakness. Negative for seizures.  Hematological:  Negative for adenopathy. Does not bruise/bleed easily.  Psychiatric/Behavioral: Negative.      Objective:  BP 122/82 (BP Location: Left Arm, Patient Position: Sitting, Cuff Size: Large)   Pulse 84    Ht 4' 11.7" (1.516 m)   Wt 60 lb 6.4 oz (27.4 kg)   SpO2 96%   BMI 11.91 kg/m   BP Readings from Last 3 Encounters:  11/21/22 122/82  08/08/22 110/82  08/02/22 (!) 144/99    Wt Readings from Last 3 Encounters:  11/21/22 60 lb 6.4 oz (27.4 kg)  11/14/22 73 lb 12.8 oz (33.5 kg)  08/08/22 74 lb (33.6 kg)    Physical Exam Vitals reviewed.  HENT:     Mouth/Throat:     Mouth: Mucous membranes are moist.  Eyes:     General: No scleral icterus.    Conjunctiva/sclera: Conjunctivae normal.  Cardiovascular:     Rate and Rhythm: Normal rate and regular rhythm.     Heart sounds: No murmur heard. Pulmonary:     Effort: Pulmonary effort is normal.     Breath sounds: No stridor. No wheezing, rhonchi or rales.  Abdominal:     General: Abdomen is flat. Bowel sounds are normal. There is no distension.     Palpations: Abdomen is soft. There is no hepatomegaly, splenomegaly or mass.     Tenderness: There is no abdominal tenderness. There is no guarding.  Musculoskeletal:        General: Normal range of motion.     Cervical back: Neck supple.     Right lower leg: No edema.     Left lower leg: No edema.  Lymphadenopathy:     Cervical: No cervical adenopathy.  Skin:    General: Skin is warm and dry.  Neurological:     Mental Status: He is alert. Mental status is at baseline.     Cranial Nerves: Cranial nerve deficit present.     Sensory: Sensory deficit present.     Motor: Weakness present.     Coordination: Coordination abnormal.     Gait: Gait abnormal.     Deep Tendon Reflexes: Reflexes abnormal.     Lab Results  Component Value Date   WBC 3.1 (L) 11/21/2022   HGB 15.4 11/21/2022   HCT 45.6 11/21/2022   PLT 84.0 (L) 11/21/2022   GLUCOSE 90 11/21/2022   ALT 22 11/21/2022   AST 26 11/21/2022   NA 140 11/21/2022   K 4.3 11/21/2022   CL 101 11/21/2022   CREATININE 0.49 11/21/2022   BUN 10 11/21/2022   CO2 25 11/21/2022   TSH 3.16 11/21/2022    DG SWALLOW FUNC SPEECH  PATH  Result Date: 08/22/2022 Table formatting from the original result was not included. Images from the original result were not included. Objective Swallowing Evaluation: Type of Study: MBS-Modified Barium Swallow Study  Patient Details Name: Theodore Lewis MRN: [id number] Date of Birth: [birth year] Today's Date: 08/22/2022 Time: SLP Start Time (ACUTE ONLY): 1315 -SLP Stop Time (ACUTE ONLY): 1350 SLP Time Calculation (min) (ACUTE ONLY): 35 min Past Medical History: Past Medical History: Diagnosis Date  Anxiety   Cerebral palsy (HCC)   Congenital quadriparesis (HCC)   Neuromuscular disorder (HCC)   Seizure (HCC)   Seizures (HCC)   last seizure 3 yrs ago Past Surgical History: Past Surgical History: Procedure Laterality Date  BRAIN SURGERY    Shunt placed when he was a yr. 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 This was treated with VP shunt. He has resultant spastic quadriparesis, contractures, poor vision, evidence of an axial collection of mixed type in the right frontal-temporal-parietal region and possible infarction of the right brain with encephalomalacia of the left brain, as well as intermittent episode of agitation. He is taking and tolerating Depakote and Carbatrol for his seizure disorder. He is cared for at home by his parents, but his father has significant health problems that is limiting his ability to provide physical care."  Pt's mom reports pt has a very limited diet - he eats chicken nuggets.  Mom reports pt tolerates liquids  well but sometimes he gags on puree and solid foods.  She denies pt having issues with liquids.  Pt also has h/o GERD and medication list includes Famotidine, Depakote, Zyrtec, Valium and Diazepam.  Theodore Lewis has seen Dr Pollyann Kennedy, ENT, before and he advised pt stop consuming Cola due to reflux and possible throat irritation.  Pt has lost weight but has not had pneumonias. Mom reports his medications are crushed/sprinkles.  He eats better at his grandma's house.  MBS ordered to assess oropharyngeal swallow.  Subjective: pt awake in chair; mom present  Recommendations for follow up therapy are one component of a multi-disciplinary discharge  planning process, led by the attending physician.  Recommendations may be updated based on patient status, additional functional criteria and insurance authorization. Assessment / Plan / Recommendation   08/22/2022   3:00 PM Clinical Impressions Clinical Impression Limited study as pt was apprehensive to swallow the barium *despite it being sweetened with artificial sweetener* and several options provided.  In addition, suboptimal view due to pt leaning forward out of flouro view.    Patient's mother had to help feed Theodore Lewis - as he would only accept *reluctantly* intake from her.  Mild oral dysphagia most c/b impaired oral control resulting in premature spillage of boluses into pharynx and lack of adequate mastication.  Pt did not adequate masticate with sweet potato despite mom rubbing his chin in effort to induce mastication.   Minimal vertical mastication observed with pt and prolonged lingual press to palate to transit bolus into pharynx it- consistent with intellectual disability.   Pudding bolus rapidly spilled (dumped) into pharynx with swallow triggering at vallecular space.  SLP had pt use his bottle/straw during the test - with him accepting only small sips but adequate airway protection.  Premature spillage to pyriform sinus with thin observed prior to swallow trigger.   Pharyngeal swallow was strong without any retention fortunately.  Eros did not cough or gag during po trials of MBS - but he only accepted approx. 7 boluses total.    Upon esophageal sweep, pt noted to be kyphotic - which raises concerns for potential multifactorial dysphagia including esophageal issues or GI issues contributing to gagging.  Please see video loops of esophageal sweep.    Recommend follow up SLP therapy to address oral dysphagia including working on mastication of solids and consider GI work up.  Reviewed with mom potential to have pt practice with mesh feeder or chewing tubes.  Suspect some of his gagging may be due to lack of adequate mastication.  SLP reviewed video loops from pt's study with her including compensation strategies. Thanks for this consult. SLP Visit Diagnosis Dysphagia, oral phase (R13.11) Impact on safety and function Moderate aspiration risk      No data to display         No data to display      08/22/2022   3:00 PM Diet Recommendations SLP Diet Recommendations Dysphagia 3 (Mech soft) solids;Dysphagia 2 (Fine chop) solids;Dysphagia 1 (Puree) solids;Thin liquid Liquid Administration via Straw Medication Administration Whole meds with puree Compensations Slow rate;Small sips/bites Postural Changes Remain semi-upright after after feeds/meals (Comment);Seated upright at 90 degrees     08/22/2022   3:00 PM Other Recommendations Recommended Consults OP therapy for feeding;Consider GI evaluation    No data to display        08/22/2022   3:00 PM Oral Phase Oral Phase Impaired Oral - Thin Straw WFL;Piecemeal swallowing Oral - Puree Premature spillage Oral - Mech Soft Impaired mastication;Premature spillage Oral Phase - Comment Mom put her hands under pt's jaw to mimic mastication - as he was not conducting this independently.    08/22/2022   3:00 PM Pharyngeal Phase Pharyngeal Phase WFL;Impaired Pharyngeal- Thin Straw Delayed swallow initiation-pyriform sinuses Pharyngeal- Puree  Delayed swallow initiation-vallecula Pharyngeal- Mechanical Soft Delayed swallow initiation-vallecula Pharyngeal Comment Pharyngeal swallow was strong without retention.  Pt prematurely spills boluses into pharynx before swallow is triggered.    08/22/2022   3:00 PM Cervical Esophageal Phase  Cervical Esophageal Phase Impaired, Pt is kyphotic raising concerns for potential restrictive esophageal deficits. Radiologist not present during study.  Tammy  Raliegh Ip MS [place] Office [phone] Pager [phone]    Macario Golds 08/22/2022, 4:05 PM  Kathleen Lime, MS [facility] SLP Acute Rehab Services Office [phone] Pager [phone]                     Assessment & Plan:   Kenniel was seen today for follow-up.  Diagnoses and all orders for this visit:  Thrombocytopenia ([place])- Platelets are stable.  Labs are negative for secondary causes. -     CBC with Differential/Platelet; Future -     Hepatitis C antibody; Future -     HIV Antibody (routine testing w rflx); Future -     Vitamin B12; Future -     Folate; Future -     Folate -     Vitamin B12 -     HIV Antibody (routine testing w rflx) -     Hepatitis C antibody -     CBC with Differential/Platelet  Loss of weight- Labs are negative for secondary causes.  His mother is working on providing him with different food choices that will interest him. -     TSH; Future -     Hepatic function panel; Future -     Basic metabolic panel; Future -     Basic metabolic panel -     Hepatic function panel -     TSH   I am having Estrellita Ludwig. Knickerbocker maintain his famotidine, diazepam, cetirizine HCl, famotidine, Depakote Sprinkles, Nutritional Supplement Plus, carbamazepine, and diazepam.  No orders of the defined types were placed in this encounter.    Follow-up: Return in about 6 months (around 05/22/2023).  Scarlette Calico, MD

## 2022-11-21 NOTE — Patient Instructions (Signed)
Thrombocytopenia Thrombocytopenia is a condition in which there are a low number of platelets in the blood. Platelets are also called thrombocytes. Platelets are parts of blood that stick together and form a clot to help the body stop bleeding after an injury. If you have too few platelets, your blood may have trouble clotting. This may cause you to bleed and bruise very easily. Some cases of thrombocytopenia are mild while others are more severe. What are the causes? This condition is caused by a low number of platelets in your blood. There are three main reasons for this: Your body not making enough platelets. This may be caused by: Bone marrow diseases. This include aplastic anemia, leukemia, and myelodysplastic anemia. Congenital thrombocytopenia. This is a condition that is passed from parent to child (inherited). Certain cancer treatments, including chemotherapy and radiation therapy. Infections from bacteria or viruses. Alcohol use disorder and alcoholism. Platelets not being released in the blood. This is called platelet sequestration and it can happen due to: An overactive spleen (hypersplenism). The spleen gathers up platelets from circulation, meaning that the platelets are not available to help with clotting your blood. The spleen can be enlarged because of scarring or other conditions. Gaucher disease. Your body destroying platelets too quickly. This may be caused by: An autoimmune disease that causes immune thrombocytopenia (ITP). ITP is sometimes associated with other autoimmune conditions such as lupus. Certain medicines, such as blood thinners. Certain blood clotting or bleeding disorders. Exposure to toxic chemicals, such as pesticides, lead, benzene, and arsenic. Pregnancy. What are the signs or symptoms? Symptoms of this condition are the result of poor blood clotting. They will vary depending on how low the platelet counts are. Symptoms may include: Bruising  easily. Bleeding from the mouth or nose. Heavy menstrual periods. Blood in the urine, stool (feces), or vomit. Purplish-red discolorations on the skin (purpura). A rash that looks like pinpoint, purplish-red spots (petechiae) on the lower legs. How is this diagnosed?  This condition may be diagnosed with blood tests and a physical exam. You may also have other tests, including: A sample of bone marrow (biopsy) may be removed to look for the original cells that make platelets. An ultrasound or CT scan of the abdomen to check for an enlarged spleen, enlarged lymph nodes, or liver problems. How is this treated? Treatment for this condition depends on the cause. Treatment may include: Treatment of another condition that is causing the low platelet count. Medicines to help protect your platelets from being destroyed. A replacement (transfusion) of platelets to stop or prevent bleeding. Surgery to remove the spleen. Follow these instructions at home: Medicines Take over-the-counter and prescription medicines only as told by your health care provider. Do not take any medicines that contain aspirin or NSAIDs, such as ibuprofen. These medicines increase your risk for dangerous bleeding. Activity Avoid activities that could cause injury or bruising, and follow instructions about how to prevent falls. Do not play contact sports. Ask your health care provider what activities are safe for you. Take extra care to protect yourself from burns when ironing or cooking. Take extra care not to cut yourself when you shave or when you use scissors, needles, knives, and other tools. General instructions  Check your skin and the inside of your mouth for bruising or bleeding as told by your health care provider. Wear a medical alert bracelet that says that you have a bleeding disorder. This can help you get the treatment you need in case of emergency. Check  your urine and stool for blood as told by your health  care provider. Do not drink alcohol. If you do drink alcohol, limit the amount that you drink. Minimize contact with toxic chemicals. Tell all your health care providers, including dental care providers and eye doctors, about your condition. Make sure to tell dental care providers before you have any procedure done, including dental cleanings. Keep all follow-up visits. This is important. Contact a health care provider if: You have unexplained bruising. You have new symptoms. You have symptoms that get worse. You have a fever. Get help right away if: You have severe bleeding from anywhere on your body. You have blood in your vomit, urine, or stool. You have an injury to your head. You have a sudden, severe headache. Summary Thrombocytopenia is a condition in which you have a low number of platelets in the blood. Platelets are parts of blood that stick together to form a clot. Symptoms of this condition are the result of poor blood clotting and may include bruising easily, bleeding from the nose or mouth, petechiae, and purpura. This condition may be diagnosed with blood tests and a physical exam. Treatment for this condition depends on the cause. This information is not intended to replace advice given to you by your health care provider. Make sure you discuss any questions you have with your health care provider. Document Revised: 04/05/2021 Document Reviewed: 04/05/2021 Elsevier Patient Education  Theodore Lewis.

## 2022-11-22 ENCOUNTER — Encounter: Payer: Self-pay | Admitting: Internal Medicine

## 2022-11-22 ENCOUNTER — Ambulatory Visit: Payer: Medicare Other

## 2022-11-22 LAB — HIV ANTIBODY (ROUTINE TESTING W REFLEX): HIV 1&2 Ab, 4th Generation: NONREACTIVE

## 2022-11-22 LAB — HEPATITIS C ANTIBODY: Hepatitis C Ab: NONREACTIVE

## 2022-11-27 ENCOUNTER — Ambulatory Visit: Payer: Medicare Other

## 2022-11-29 ENCOUNTER — Ambulatory Visit: Payer: Medicare Other

## 2022-12-09 ENCOUNTER — Encounter: Payer: Self-pay | Admitting: Gastroenterology

## 2022-12-30 DIAGNOSIS — G809 Cerebral palsy, unspecified: Secondary | ICD-10-CM | POA: Diagnosis not present

## 2023-01-01 ENCOUNTER — Ambulatory Visit: Payer: 59

## 2023-01-08 ENCOUNTER — Ambulatory Visit (INDEPENDENT_AMBULATORY_CARE_PROVIDER_SITE_OTHER): Payer: 59 | Admitting: Gastroenterology

## 2023-01-08 ENCOUNTER — Encounter: Payer: Self-pay | Admitting: Gastroenterology

## 2023-01-08 VITALS — BP 118/78 | HR 91 | Wt <= 1120 oz

## 2023-01-08 DIAGNOSIS — R634 Abnormal weight loss: Secondary | ICD-10-CM

## 2023-01-08 DIAGNOSIS — R1310 Dysphagia, unspecified: Secondary | ICD-10-CM

## 2023-01-08 NOTE — Progress Notes (Signed)
01/08/2023 Theodore Lewis [id number] 1996-04-09   HISTORY OF PRESENT ILLNESS:  This is a 27 year old male with CP, nonverbal, history of seizure disorder.  Referred here for "dysphagia".  After talking extensively with his parents, it seems like more the concern is weight loss and the ability to get him to eat.  They are trying to avoid a feeding tube.  He is a very picky eater.  No overt issues with dysphagia are being relayed by the parents.  Not necessarily any indication that he is in distress from pain.  More so it seems that neurology wants to make sure nothing else is contributing to make him want to avoid eating.  Modified barium swallow study fairly unremarkable other than the patient not necessarily doing well with mastication of his foods.  Says the only time that he tends to gag when he swallows is if he tries to swallow something whole without chewing it well.  Past Medical History:  Diagnosis Date   Anxiety    Cerebral palsy ([place])    Congenital quadriparesis ([place])    Neuromuscular disorder ([place])    Seizure ([place])    Seizures ([place])    last seizure 3 yrs ago   Past Surgical History:  Procedure Laterality Date   BRAIN SURGERY     Shunt placed when he was a yr. old   I & D EXTREMITY Bilateral 04/29/2014   Procedure: IRRIGATION AND DEBRIDEMENT EXTREMITY;  Surgeon: Marybelle Killings, MD;  Location: [place];  Service: Orthopedics;  Laterality: Bilateral;  Right and Left Hamstring Lenghening and Fiberglass Casting   LEG TENDON SURGERY Bilateral approx 6 yrs ago    reports that he has never smoked. He has never been exposed to tobacco smoke. He has never used smokeless tobacco. He reports that he does not drink alcohol and does not use drugs. family history includes Cancer in his paternal grandfather and paternal grandmother; Diabetes in his father and mother; Early death in his paternal grandfather and paternal grandmother; Healthy in his mother; Heart disease in his father, maternal  grandfather, and mother; Hyperlipidemia in his father, maternal grandfather, and mother; Hypertension in his father, maternal grandfather, and maternal grandmother; Intellectual disability in his maternal grandfather; Learning disabilities in his father; Stomach cancer in his paternal grandfather and paternal grandmother; Throat cancer in his paternal grandfather. Allergies  Allergen Reactions   Penicillins Other (See Comments)    Right side of body started shaking     Vancomycin Rash      Outpatient Encounter Medications as of 01/08/2023  Medication Sig   carbamazepine (CARBATROL) 300 MG 12 hr capsule Take 1 capsule (300 mg total) by mouth 2 (two) times daily.   cetirizine HCl (ZYRTEC) 5 MG/5ML SOLN Take 10 mLs (10 mg total) by mouth daily.   DEPAKOTE SPRINKLES 125 MG capsule TAKE 5 CAPSULES BY MOUTH TWICE DAILY   diazepam (DIASTAT ACUDIAL) 10 MG GEL INSERT '10MG'$  RECTALLY AT ONSET OF SEIZURE   diazepam (VALIUM) 1 MG/ML solution Give 1 ml at bedtime and give 17m by mouth up to twice per day as needed for restlessness   Nutritional Supplements (NUTRITIONAL SUPPLEMENT PLUS) LIQD 1 Ensure Clear or Boost Breeze given PO daily.   famotidine (PEPCID) 40 MG tablet Take 40 mg by mouth daily. (Patient not taking: Reported on 01/08/2023)   famotidine (PEPCID) 40 MG/5ML suspension Take 5 mLs (40 mg total) by mouth at bedtime. (Patient not taking: Reported on 01/08/2023)   No facility-administered encounter  medications on file as of 01/08/2023.    REVIEW OF SYSTEMS  : All other systems reviewed and negative except where noted in the History of Present Illness.   PHYSICAL EXAM: BP 118/78   Pulse 91   Wt 60 lb (27.2 kg)   SpO2 97%   BMI 11.84 kg/m  General: Thin male in no acute distress; in wheelchair Head: Normocephalic and atraumatic Eyes:  Sclerae anicteric, conjunctiva pink. Lungs: Clear throughout to auscultation; no W/R/R. Heart: Regular rate and rhythm; no M/R/G. Abdomen: Soft, non-distended.   BS present.  Did not express tenderness.  ASSESSMENT AND PLAN: *27 year old male with CP, nonverbal.  Seems like more the concern is weight loss and the ability to get him to eat.  He is a very picky eater.  No overt issues with dysphagia are being relayed by the parents.  More so it seems that neurology wants to make sure nothing else is contributing to make him want to avoid eating from the sounds of it.  Modified barium swallow study fairly unremarkable other than the patient not necessarily doing well with mastication of his foods.  Will start first with an upper GI series rather than jumping to an EGD.  Could consider a CT scan of his abdomen and pelvis, but he would require being sedated for that study as he will not hold still and lay down long enough for that to be done.  Continued to encourage his mother to offer different foods, maybe try to milkshakes if he will drink those and mix Ensure in with them to get other calories and nutrients that way as well, etc.  CC:  Janith Lima, MD

## 2023-01-08 NOTE — Patient Instructions (Signed)
You have been scheduled for an Upper GI Series at [facility]. Your appointment is on Monday 01/20/23 at  11 am. Please arrive 30 minutes prior to your test for registration. Make sure not to eat or drink anything after midnight on the night before your test. If you need to reschedule, please call radiology at [phone]. ________________________________________________________________ An upper GI series uses x rays to help diagnose problems of the upper GI tract, which includes the esophagus, stomach, and duodenum. The duodenum is the first part of the small intestine. An upper GI series is conducted by a radiology technologist or a radiologist--a doctor who specializes in x-ray imaging--at a hospital or outpatient center. While sitting or standing in front of an x-ray machine, the patient drinks barium liquid, which is often white and has a chalky consistency and taste. The barium liquid coats the lining of the upper GI tract and makes signs of disease show up more clearly on x rays. X-ray video, called fluoroscopy, is used to view the barium liquid moving through the esophagus, stomach, and duodenum. Additional x rays and fluoroscopy are performed while the patient lies on an x-ray table. To fully coat the upper GI tract with barium liquid, the technologist or radiologist may press on the abdomen or ask the patient to change position. Patients hold still in various positions, allowing the technologist or radiologist to take x rays of the upper GI tract at different angles. If a technologist conducts the upper GI series, a radiologist will later examine the images to look for problems.  This test typically takes about 1 hour to complete. __________________________________________________________________  If your blood pressure at your visit was 140/90 or greater, please contact your primary care physician to follow up on this.  _______________________________________________________  If  you are age 50 or older, your body mass index should be between 23-30. Your Body mass index is 11.84 kg/m. If this is out of the aforementioned range listed, please consider follow up with your Primary Care Provider.  If you are age 17 or younger, your body mass index should be between 19-25. Your Body mass index is 11.84 kg/m. If this is out of the aformentioned range listed, please consider follow up with your Primary Care Provider.   ________________________________________________________  The [facility] providers would like to encourage you to use [facility] to communicate with providers for non-urgent requests or questions.  Due to long hold times on the telephone, sending your provider a message by [facility] may be a faster and more efficient way to get a response.  Please allow 48 business hours for a response.  Please remember that this is for non-urgent requests.  _______________________________________________________

## 2023-01-13 ENCOUNTER — Ambulatory Visit: Payer: 59

## 2023-01-14 ENCOUNTER — Ambulatory Visit: Payer: 59

## 2023-01-17 ENCOUNTER — Other Ambulatory Visit (INDEPENDENT_AMBULATORY_CARE_PROVIDER_SITE_OTHER): Payer: Self-pay | Admitting: Family

## 2023-01-17 DIAGNOSIS — G40309 Generalized idiopathic epilepsy and epileptic syndromes, not intractable, without status epilepticus: Secondary | ICD-10-CM

## 2023-01-17 DIAGNOSIS — G40209 Localization-related (focal) (partial) symptomatic epilepsy and epileptic syndromes with complex partial seizures, not intractable, without status epilepticus: Secondary | ICD-10-CM

## 2023-01-20 ENCOUNTER — Ambulatory Visit: Payer: 59

## 2023-01-20 ENCOUNTER — Telehealth: Payer: Self-pay

## 2023-01-20 ENCOUNTER — Ambulatory Visit (HOSPITAL_COMMUNITY): Payer: 59

## 2023-01-20 NOTE — Telephone Encounter (Signed)
Contacted Conell Nardolillo Trupiano to schedule their annual wellness visit. Appointment made for 01/22/23.  Norton Blizzard, [place] ([facility])  [facility] [phone]

## 2023-01-20 NOTE — Telephone Encounter (Signed)
Last OV 11/14/2022 Next OV 05/15/2023 Rx written 11/03/2022 with 1 refill Diazepam- Requesting 90 d supply

## 2023-01-22 ENCOUNTER — Telehealth: Payer: Self-pay

## 2023-01-22 ENCOUNTER — Ambulatory Visit: Payer: 59

## 2023-01-22 NOTE — Telephone Encounter (Signed)
Called patient lvm to return call, to complete AWV at [phone].  If no return call within 15 minutes, patient may reschedule for the next available appointment with [facility] or [facility].  Huxley Vanwagoner N. Nela Bascom, LPN. [facility] AWV Team Direct Dial: [phone]  

## 2023-01-27 ENCOUNTER — Ambulatory Visit (HOSPITAL_COMMUNITY)
Admission: RE | Admit: 2023-01-27 | Discharge: 2023-01-27 | Disposition: A | Discharge status: Home / Self Care | Payer: 59 | Source: Ambulatory Visit | Attending: Gastroenterology | Admitting: Gastroenterology

## 2023-01-27 DIAGNOSIS — R634 Abnormal weight loss: Secondary | ICD-10-CM | POA: Insufficient documentation

## 2023-01-27 DIAGNOSIS — R1310 Dysphagia, unspecified: Secondary | ICD-10-CM | POA: Diagnosis not present

## 2023-01-27 DIAGNOSIS — K222 Esophageal obstruction: Secondary | ICD-10-CM | POA: Diagnosis not present

## 2023-01-29 ENCOUNTER — Other Ambulatory Visit (INDEPENDENT_AMBULATORY_CARE_PROVIDER_SITE_OTHER): Payer: Self-pay

## 2023-01-29 DIAGNOSIS — G40309 Generalized idiopathic epilepsy and epileptic syndromes, not intractable, without status epilepticus: Secondary | ICD-10-CM

## 2023-01-29 DIAGNOSIS — G40209 Localization-related (focal) (partial) symptomatic epilepsy and epileptic syndromes with complex partial seizures, not intractable, without status epilepticus: Secondary | ICD-10-CM

## 2023-01-29 MED ORDER — DEPAKOTE SPRINKLES 125 MG PO CSDR: DELAYED_RELEASE_CAPSULE | ORAL | 3 refills | Status: DC

## 2023-01-30 ENCOUNTER — Other Ambulatory Visit: Payer: Self-pay

## 2023-01-30 DIAGNOSIS — K219 Gastro-esophageal reflux disease without esophagitis: Secondary | ICD-10-CM

## 2023-01-30 MED ORDER — FAMOTIDINE 40 MG/5ML PO SUSR: 40.0000 mg | Freq: Two times a day (BID) | ORAL | 2 refills | Status: DC

## 2023-02-18 NOTE — Progress Notes (Signed)
Reviewed and agree with documentation and assessment and plan. K. Veena Oshay Stranahan , MD   

## 2023-02-21 ENCOUNTER — Ambulatory Visit: Payer: 59 | Admitting: Family Medicine

## 2023-02-21 ENCOUNTER — Other Ambulatory Visit: Payer: Self-pay

## 2023-02-21 ENCOUNTER — Encounter (HOSPITAL_COMMUNITY): Payer: Self-pay

## 2023-02-21 ENCOUNTER — Emergency Department (HOSPITAL_COMMUNITY)
Admission: EM | Admit: 2023-02-21 | Discharge: 2023-02-21 | Disposition: A | Discharge status: Home / Self Care | Payer: 59 | Attending: Emergency Medicine | Admitting: Emergency Medicine

## 2023-02-21 DIAGNOSIS — R21 Rash and other nonspecific skin eruption: Secondary | ICD-10-CM | POA: Diagnosis not present

## 2023-02-21 DIAGNOSIS — F17210 Nicotine dependence, cigarettes, uncomplicated: Secondary | ICD-10-CM | POA: Diagnosis not present

## 2023-02-21 DIAGNOSIS — Z79899 Other long term (current) drug therapy: Secondary | ICD-10-CM | POA: Diagnosis not present

## 2023-02-21 DIAGNOSIS — G9389 Other specified disorders of brain: Secondary | ICD-10-CM | POA: Diagnosis not present

## 2023-02-21 DIAGNOSIS — S0990XA Unspecified injury of head, initial encounter: Secondary | ICD-10-CM | POA: Diagnosis not present

## 2023-02-21 DIAGNOSIS — N390 Urinary tract infection, site not specified: Secondary | ICD-10-CM | POA: Diagnosis not present

## 2023-02-21 DIAGNOSIS — E785 Hyperlipidemia, unspecified: Secondary | ICD-10-CM | POA: Diagnosis not present

## 2023-02-21 DIAGNOSIS — I10 Essential (primary) hypertension: Secondary | ICD-10-CM | POA: Diagnosis not present

## 2023-02-21 DIAGNOSIS — Z8673 Personal history of transient ischemic attack (TIA), and cerebral infarction without residual deficits: Secondary | ICD-10-CM | POA: Diagnosis not present

## 2023-02-21 DIAGNOSIS — G309 Alzheimer's disease, unspecified: Secondary | ICD-10-CM | POA: Diagnosis not present

## 2023-02-21 DIAGNOSIS — I4510 Unspecified right bundle-branch block: Secondary | ICD-10-CM | POA: Diagnosis not present

## 2023-02-21 DIAGNOSIS — M47812 Spondylosis without myelopathy or radiculopathy, cervical region: Secondary | ICD-10-CM | POA: Diagnosis not present

## 2023-02-21 DIAGNOSIS — R451 Restlessness and agitation: Secondary | ICD-10-CM | POA: Diagnosis not present

## 2023-02-21 DIAGNOSIS — B379 Candidiasis, unspecified: Secondary | ICD-10-CM | POA: Insufficient documentation

## 2023-02-21 DIAGNOSIS — L299 Pruritus, unspecified: Secondary | ICD-10-CM | POA: Diagnosis not present

## 2023-02-21 LAB — CBC WITH DIFFERENTIAL/PLATELET
Abs Immature Granulocytes: 0.02 10*3/uL (ref 0.00–0.07)
Basophils Absolute: 0 10*3/uL (ref 0.0–0.1)
Basophils Relative: 0 %
Eosinophils Absolute: 0 10*3/uL (ref 0.0–0.5)
Eosinophils Relative: 0 %
HCT: 43.5 % (ref 39.0–52.0)
Hemoglobin: 14.7 g/dL (ref 13.0–17.0)
Immature Granulocytes: 1 %
Lymphocytes Relative: 25 %
Lymphs Abs: 1 10*3/uL (ref 0.7–4.0)
MCH: 33.2 pg (ref 26.0–34.0)
MCHC: 33.8 g/dL (ref 30.0–36.0)
MCV: 98.2 fL (ref 80.0–100.0)
Monocytes Absolute: 0.4 10*3/uL (ref 0.1–1.0)
Monocytes Relative: 11 %
Neutro Abs: 2.4 10*3/uL (ref 1.7–7.7)
Neutrophils Relative %: 63 %
Platelets: 104 10*3/uL — ABNORMAL LOW (ref 150–400)
RBC: 4.43 MIL/uL (ref 4.22–5.81)
RDW: 13.8 % (ref 11.5–15.5)
WBC: 3.8 10*3/uL — ABNORMAL LOW (ref 4.0–10.5)
nRBC: 0 % (ref 0.0–0.2)

## 2023-02-21 LAB — URINALYSIS, ROUTINE W REFLEX MICROSCOPIC
Bacteria, UA: NONE SEEN
Bilirubin Urine: NEGATIVE
Glucose, UA: NEGATIVE mg/dL
Ketones, ur: 5 mg/dL — AB
Nitrite: NEGATIVE
Protein, ur: 30 mg/dL — AB
RBC / HPF: >50 RBC/hpf (ref 0–5)
Specific Gravity, Urine: 1.024 (ref 1.005–1.030)
pH: 7 (ref 5.0–8.0)

## 2023-02-21 LAB — BASIC METABOLIC PANEL
Anion gap: 9 (ref 5–15)
BUN: 10 mg/dL (ref 6–20)
CO2: 26 mmol/L (ref 22–32)
Calcium: 9.3 mg/dL (ref 8.9–10.3)
Chloride: 102 mmol/L (ref 98–111)
Creatinine, Ser: 0.61 mg/dL (ref 0.61–1.24)
GFR, Estimated: >60 mL/min (ref >60)
Glucose, Bld: 98 mg/dL (ref 70–99)
Potassium: 4.1 mmol/L (ref 3.5–5.1)
Sodium: 137 mmol/L (ref 135–145)

## 2023-02-21 MED ORDER — NYSTATIN-TRIAMCINOLONE 100000-0.1 UNIT/GM-% EX OINT: 1.0000 | TOPICAL_OINTMENT | Freq: Two times a day (BID) | CUTANEOUS | 0 refills | Status: DC

## 2023-02-21 MED ORDER — MIDAZOLAM HCL 2 MG/ML PO SYRP
2.0000 mg | ORAL_SOLUTION | Freq: Once | ORAL | Status: AC
  Administered 2023-02-21: 2 mg via ORAL
  Filled 2023-02-21: qty 5

## 2023-02-21 MED ORDER — MIDAZOLAM HCL 2 MG/ML PO SYRP: 2.0000 mg | ORAL_SOLUTION | Freq: Once | ORAL | Status: DC

## 2023-02-21 NOTE — ED Provider Notes (Signed)
[place] EMERGENCY DEPARTMENT AT [facility] Provider Note   CSN: [id number] Arrival date & time: 02/21/23  1154     History  Chief Complaint  Patient presents with   Urinary Frequency    Theodore Lewis is a 27 y.o. male.  Patient with past medical history significant for moderate intellectual disabilities, nonverbal, congenital brain abnormality, obstructive hydrocephalus, congenital quadriplegia, epilepsy, restlessness and agitation, oppositional behavior presents to the emergency department with his mother with concerns for possible urinary tract infection.  Patient's mother also concerned over patient scratching his groin on the right side. Patient has reportedly been more agitated than usual.  Primary care recommend patient come to the emergency department for check for possible UTI and to evaluate kidney function.  HPI     Home Medications Prior to Admission medications   Medication Sig Start Date End Date Taking? Authorizing Provider  carbamazepine (CARBATROL) 300 MG 12 hr capsule Take 1 capsule (300 mg total) by mouth 2 (two) times daily. 08/23/22   Elveria Rising, NP  cetirizine HCl (ZYRTEC) 5 MG/5ML SOLN Take 10 mLs (10 mg total) by mouth daily. 08/02/21   Etta Grandchild, MD  DEPAKOTE SPRINKLES 125 MG capsule TAKE 5 CAPSULES BY MOUTH TWICE DAILY 01/29/23   Elveria Rising, NP  diazepam (DIASTAT ACUDIAL) 10 MG GEL INSERT  RECTALLY AT ONSET OF SEIZURE 01/20/23   Elveria Rising, NP  diazepam (VALIUM) 1 MG/ML solution Give 1 ml at bedtime and give 1ml by mouth up to twice per day as needed for restlessness 04/13/21   Elveria Rising, NP  famotidine (PEPCID) 40 MG/5ML suspension Take 5 mLs (40 mg total) by mouth 2 (two) times daily. 01/30/23   Zehr, Princella Pellegrini, PA-C  Nutritional Supplements (NUTRITIONAL SUPPLEMENT PLUS) LIQD 1 Ensure Clear or Boost Breeze given PO daily. 08/08/22   Elveria Rising, NP      Allergies    Penicillins and Vancomycin     Review of Systems   Review of Systems  Physical Exam Updated Vital Signs BP (!) 129/95 (BP Location: Left Arm)   Pulse (!) 101   Temp 98.2 F (36.8 C) (Oral)   Resp 17   Ht 4' (1.219 m)   Wt 34 kg   SpO2 97%   BMI 22.89 kg/m  Physical Exam Vitals and nursing note reviewed. Exam conducted with a chaperone present.  Constitutional:      General: He is not in acute distress.    Appearance: He is well-developed.  HENT:     Head: Normocephalic and atraumatic.  Eyes:     Conjunctiva/sclera: Conjunctivae normal.  Cardiovascular:     Rate and Rhythm: Normal rate and regular rhythm.     Heart sounds: No murmur heard. Pulmonary:     Effort: Pulmonary effort is normal. No respiratory distress.     Breath sounds: Normal breath sounds.  Abdominal:     Palpations: Abdomen is soft.     Tenderness: There is no abdominal tenderness.  Genitourinary:    Penis: Normal.   Musculoskeletal:        General: No swelling.     Cervical back: Neck supple.  Skin:    General: Skin is warm and dry.     Capillary Refill: Capillary refill takes less than 2 seconds.     Comments: Small, irritated area on patient's inner right thigh near groin.  No signs of cellulitis, no significant erythema.  Area looks consistent with Candida  Neurological:     Mental  Status: He is alert. Mental status is at baseline.  Psychiatric:        Mood and Affect: Mood normal.     ED Results / Procedures / Treatments   Labs (all labs ordered are listed, but only abnormal results are displayed) Labs Reviewed  BASIC METABOLIC PANEL  CBC WITH DIFFERENTIAL/PLATELET  URINALYSIS, ROUTINE W REFLEX MICROSCOPIC    EKG None  Radiology No results found.  Procedures Procedures    Medications Ordered in ED Medications  midazolam (VERSED) 2 MG/ML syrup 2 mg (2 mg Oral Given 02/21/23 1337)    ED Course/ Medical Decision Making/ A&P                             Medical Decision Making Amount and/or Complexity  of Data Reviewed Labs: ordered.  Risk Prescription drug management.   Patient presents the emergency department due to increased agitation and concerns of a urinary tract infection.  Differential includes urinary tract infection, acute kidney injury, dehydration, infection, others  I reviewed the patient's past medical history.  Patient has multiple medical comorbidities including intellectual disability, oppositional disorder, congenital brain abnormality  I ordered labs at this time including urinalysis, CBC, BMP.  Labs are pending at this time.  Disposition pending results of labs and reevaluation.  Differential continues to include UTI, AKI, dehydration, infection, worsening of patient's oppositional disorder        Final Clinical Impression(s) / ED Diagnoses Final diagnoses:  None    Rx / DC Orders ED Discharge Orders     None         Pamala Duffel 02/21/23 1505    Lorre Nick, MD 02/24/23 1625

## 2023-02-21 NOTE — ED Provider Notes (Signed)
Care assumed from Barrie Dunker, PA-C at shift change. Please see their note for further information.   Briefly: Patient with hx spastic quadriplegia, nonverbal, obstructive hydrocephalus epilepsy, intellectual disabilities presents today with groin itching and worsening aggression. Pcp recommended patient come in for UA and labs to test kidney function.  Plan: per previous provider, area of groin irritation is not cellulitic in nature or concerning for superimposed infection. Plan for nystatin cream rx. Labs and UA delayed due to patients agitation but were able to be collected after oral versed. These are pending at shift change.   UA resulted and is noninfectious, CBC and BMP reveal no acute findings. Discussed these results with mom at bedside who is understanding. I did offer a CT head for further evaluation of a potential cause of his worsening agitation, however mom would like to defer this and follow closely with neurology for additional management of his symptoms. Evaluation and diagnostic testing in the emergency department does not suggest an emergent condition requiring admission or immediate intervention beyond what has been performed at this time.  Plan for discharge with nystatin cream with close PCP/neurology follow-up.  Patient is understanding and amenable with plan, educated on red flag symptoms that would prompt immediate return.  Patient discharged in stable condition.    Theodore Lewis 02/21/23 1642    Gwyneth Sprout, MD 02/26/23 0002

## 2023-02-21 NOTE — ED Triage Notes (Signed)
Pt to er, mom states that she is worried that pt might have a urinary infection, states that pt is scratching more down in his groin, states that he is also not wanting to take his diaper off.  Mom states that pt is non verbal.

## 2023-02-21 NOTE — ED Notes (Signed)
Pts mother verbalized understanding of d/c instructions, prescription and follow up care.  ?

## 2023-02-21 NOTE — Discharge Instructions (Addendum)
As we discussed, your child's urine and lab work did not show any signs of UTI or other acute findings.  I recommend close PCP and neurology follow-up for continued evaluation and management of his symptoms.  I have given you a prescription for nystatin cream which is an antifungal for you to use for the area in your child's groin that seems to be bothering him.  Please use this as prescribed.  Return if development of any new or worsening symptoms.

## 2023-02-25 ENCOUNTER — Telehealth: Payer: Self-pay

## 2023-02-25 NOTE — Telephone Encounter (Signed)
     Patient  visit on 02/21/2023  at [facility] was for Urinary Frequency.  Have you been able to follow up with your primary care physician? Yes  The patient was or was not able to obtain any needed medicine or equipment. Patient was able to obtain OTC medication.  Are there diet recommendations that you are having difficulty following? No  Patient expresses understanding of discharge instructions and education provided has no other needs at this time. Yes  Verified mother's email address to send [employer] summary of benefits.   Eimi Viney [facility]  [facility] Population Health Community Resource Care Guide   [email or web address]  ?? [phone]   Website: [email or web address]  [email or web address]

## 2023-04-04 ENCOUNTER — Encounter (HOSPITAL_COMMUNITY): Payer: Self-pay

## 2023-04-04 ENCOUNTER — Emergency Department (HOSPITAL_COMMUNITY)
Admission: EM | Admit: 2023-04-04 | Discharge: 2023-04-04 | Disposition: A | Discharge status: Home / Self Care | Payer: 59 | Attending: Emergency Medicine | Admitting: Emergency Medicine

## 2023-04-04 DIAGNOSIS — R451 Restlessness and agitation: Secondary | ICD-10-CM

## 2023-04-04 LAB — COMPREHENSIVE METABOLIC PANEL
ALT: 23 U/L (ref 0–44)
AST: 22 U/L (ref 15–41)
Albumin: 4.5 g/dL (ref 3.5–5.0)
Alkaline Phosphatase: 77 U/L (ref 38–126)
Anion gap: 9 (ref 5–15)
BUN: 13 mg/dL (ref 6–20)
CO2: 28 mmol/L (ref 22–32)
Calcium: 9.7 mg/dL (ref 8.9–10.3)
Chloride: 100 mmol/L (ref 98–111)
Creatinine, Ser: 0.6 mg/dL — ABNORMAL LOW (ref 0.61–1.24)
GFR, Estimated: >60 mL/min (ref >60)
Glucose, Bld: 86 mg/dL (ref 70–99)
Potassium: 3.9 mmol/L (ref 3.5–5.1)
Sodium: 137 mmol/L (ref 135–145)
Total Bilirubin: 0.7 mg/dL (ref 0.3–1.2)
Total Protein: 8.2 g/dL — ABNORMAL HIGH (ref 6.5–8.1)

## 2023-04-04 LAB — CBC WITH DIFFERENTIAL/PLATELET
Abs Immature Granulocytes: 0.02 10*3/uL (ref 0.00–0.07)
Basophils Absolute: 0 10*3/uL (ref 0.0–0.1)
Basophils Relative: 0 %
Eosinophils Absolute: 0 10*3/uL (ref 0.0–0.5)
Eosinophils Relative: 0 %
HCT: 44 % (ref 39.0–52.0)
Hemoglobin: 15.5 g/dL (ref 13.0–17.0)
Immature Granulocytes: 1 %
Lymphocytes Relative: 29 %
Lymphs Abs: 1.1 10*3/uL (ref 0.7–4.0)
MCH: 35.2 pg — ABNORMAL HIGH (ref 26.0–34.0)
MCHC: 35.2 g/dL (ref 30.0–36.0)
MCV: 100 fL (ref 80.0–100.0)
Monocytes Absolute: 0.6 10*3/uL (ref 0.1–1.0)
Monocytes Relative: 15 %
Neutro Abs: 2.2 10*3/uL (ref 1.7–7.7)
Neutrophils Relative %: 55 %
Platelets: 87 10*3/uL — ABNORMAL LOW (ref 150–400)
RBC: 4.4 MIL/uL (ref 4.22–5.81)
RDW: 13.6 % (ref 11.5–15.5)
WBC: 4 10*3/uL (ref 4.0–10.5)
nRBC: 0 % (ref 0.0–0.2)

## 2023-04-04 LAB — LIPASE, BLOOD: Lipase: 25 U/L (ref 11–51)

## 2023-04-04 LAB — VALPROIC ACID LEVEL: Valproic Acid Lvl: 74 ug/mL (ref 50.0–100.0)

## 2023-04-04 MED ORDER — ONDANSETRON HCL 4 MG/5ML PO SOLN: 4.0000 mg | Freq: Three times a day (TID) | ORAL | 0 refills | Status: DC | PRN

## 2023-04-04 MED ORDER — SODIUM CHLORIDE 0.9 % IV BOLUS
1000.0000 mL | Freq: Once | INTRAVENOUS | Status: AC
  Administered 2023-04-04: 1000 mL via INTRAVENOUS

## 2023-04-04 MED ORDER — ONDANSETRON HCL 4 MG/2ML IJ SOLN
4.0000 mg | Freq: Once | INTRAMUSCULAR | Status: AC
  Administered 2023-04-04: 4 mg via INTRAVENOUS
  Filled 2023-04-04: qty 2

## 2023-04-04 NOTE — ED Provider Notes (Signed)
[facility]-EMERGENCY DEPT [facility] Emergency Department Provider Note MRN:  [id number]  Arrival date & time: 04/04/23     Chief Complaint   Agitation   History of Present Illness   Theodore Lewis is a 27 y.o. year-old male presents to the ED with chief complaint of agitation.  Mother reports that he seems to be uncomfortable.  She states that he has been having more bowel movements than normal, but states that they haven't been diarrhea.  She states that his BMs have slowed down some.  She states that he seems more agitated than normal and has been more picky about what he eats.  History provided by patient.   Review of Systems  Pertinent positive and negative review of systems noted in HPI.    Physical Exam   Vitals:   04/04/23 0415 04/04/23 0417  BP: (!) 134/98   Pulse: 95   Resp: 18   Temp:  98.7 F (37.1 C)  SpO2: 95%     CONSTITUTIONAL:  non toxic-appearing, NAD NEURO:  Alert and oriented x 3, CN 3-12 grossly intact EYES:  eyes equal and reactive ENT/NECK:  Supple, no stridor  CARDIO:  normal rate, regular rhythm, appears well-perfused  PULM:  No respiratory distress, CTAB GI/GU:  non-distended, no focal abdominal tenderness MSK/SPINE:  No gross deformities, no edema, moves all extremities, thin, wheel chair bound SKIN:  no rash, atraumatic   *Additional and/or pertinent findings included in MDM below  Diagnostic and Interventional Summary    EKG Interpretation  Date/Time:    Ventricular Rate:    PR Interval:    QRS Duration:   QT Interval:    QTC Calculation:   R Axis:     Text Interpretation:         Labs Reviewed  COMPREHENSIVE METABOLIC PANEL - Abnormal; Notable for the following components:      Result Value   Creatinine, Ser 0.60 (*)    Total Protein 8.2 (*)    All other components within normal limits  CBC WITH DIFFERENTIAL/PLATELET - Abnormal; Notable for the following components:   MCH 35.2 (*)    Platelets 87 (*)    All other  components within normal limits  LIPASE, BLOOD  VALPROIC ACID LEVEL    No orders to display    Medications  ondansetron (ZOFRAN) injection 4 mg (has no administration in time range)  sodium chloride 0.9 % bolus 1,000 mL (1,000 mLs Intravenous New Bag/Given 04/04/23 0546)     Procedures  /  Critical Care Procedures  ED Course and Medical Decision Making  I have reviewed the triage vital signs, the nursing notes, and pertinent available records from the EMR.  Social Determinants Affecting Complexity of Care: Patient has no clinically significant social determinants affecting this chief complaint..   ED Course:    Medical Decision Making Patient here for agitation and increased frequency of BMs, but this is slowing down.  Patient does not have any significant tenderness on exam.  He does not appear toxic.  Patient seen by and discussed with Dr. Bebe Shaggy, who agrees with plans for labs, but states that patient is quite well-appearing.  We reviewed the labs together and his labs look good.  Will plan for discharge home.  Mother is agreeable with the plan.  Will trial some Zofran.  Recommend watchful waiting.  Amount and/or Complexity of Data Reviewed Labs: ordered.  Risk Prescription drug management.     Consultants: No consultations were needed in caring for this  patient.   Treatment and Plan: Emergency department workup does not suggest an emergent condition requiring admission or immediate intervention beyond  what has been performed at this time. The patient is safe for discharge and has  been instructed to return immediately for worsening symptoms, change in  symptoms or any other concerns  Patient seen by and discussed with attending physician, Dr. Bebe Shaggy, who agrees with plan for discharge.  Final Clinical Impressions(s) / ED Diagnoses     ICD-10-CM   1. Agitation  R45.1       ED Discharge Orders          Ordered    ondansetron [facility]) 4 MG/5ML solution   Every 8 hours PRN        04/04/23 0618              Discharge Instructions Discussed with and Provided to Patient:     Discharge Instructions      Your emergency department looks good.  Please follow-up with your doctor.  If something changes or worsens, return to the ER.       Roxy Horseman, PA-C 04/04/23 6045    Zadie Rhine, MD 04/04/23 [phone]

## 2023-04-04 NOTE — ED Triage Notes (Signed)
Pt presents to ED for increased agitation and increased stools for the past day. Pt is non-verbal.

## 2023-04-04 NOTE — Discharge Instructions (Signed)
Your emergency department looks good.  Please follow-up with your doctor.  If something changes or worsens, return to the ER.

## 2023-04-08 ENCOUNTER — Ambulatory Visit (INDEPENDENT_AMBULATORY_CARE_PROVIDER_SITE_OTHER): Payer: 59 | Admitting: Family

## 2023-04-08 ENCOUNTER — Encounter (INDEPENDENT_AMBULATORY_CARE_PROVIDER_SITE_OTHER): Payer: Self-pay | Admitting: Family

## 2023-04-08 VITALS — BP 108/64 | Wt 70.8 lb

## 2023-04-08 DIAGNOSIS — G911 Obstructive hydrocephalus: Secondary | ICD-10-CM

## 2023-04-08 DIAGNOSIS — R6339 Other feeding difficulties: Secondary | ICD-10-CM

## 2023-04-08 DIAGNOSIS — G808 Other cerebral palsy: Secondary | ICD-10-CM

## 2023-04-08 DIAGNOSIS — R1310 Dysphagia, unspecified: Secondary | ICD-10-CM | POA: Diagnosis not present

## 2023-04-08 DIAGNOSIS — R634 Abnormal weight loss: Secondary | ICD-10-CM

## 2023-04-08 DIAGNOSIS — Z87728 Personal history of other specified (corrected) congenital malformations of nervous system and sense organs: Secondary | ICD-10-CM

## 2023-04-08 DIAGNOSIS — G40309 Generalized idiopathic epilepsy and epileptic syndromes, not intractable, without status epilepticus: Secondary | ICD-10-CM

## 2023-04-08 DIAGNOSIS — F71 Moderate intellectual disabilities: Secondary | ICD-10-CM

## 2023-04-08 DIAGNOSIS — R64 Cachexia: Secondary | ICD-10-CM

## 2023-04-08 DIAGNOSIS — G40209 Localization-related (focal) (partial) symptomatic epilepsy and epileptic syndromes with complex partial seizures, not intractable, without status epilepticus: Secondary | ICD-10-CM

## 2023-04-08 DIAGNOSIS — R638 Other symptoms and signs concerning food and fluid intake: Secondary | ICD-10-CM

## 2023-04-08 NOTE — Patient Instructions (Signed)
It was a pleasure to see you today!  Instructions for you until your next appointment are as follows: Write down all the foods that Arihan eats for the next 2 weeks, then bring him back in for a weight check.  I scheduled him to return on April 22, 2023 @ 11:30AM.  Feel free to contact our office during normal business hours at [phone] with questions or concerns. If there is no answer or the call is outside business hours, please leave a message and our clinic staff will call you back within the next business day.  If you have an urgent concern, please stay on the line for our after-hours answering service and ask for the on-call neurologist.     I also encourage you to use MyChart to communicate with me more directly. If you have not yet signed up for MyChart within [facility], the front desk staff can help you. However, please note that this inbox is NOT monitored on nights or weekends, and response can take up to 2 business days.  Urgent matters should be discussed with the on-call pediatric neurologist.   At [facility], we are committed to providing exceptional care. You will receive a patient satisfaction survey through text or email regarding your visit today. Your opinion is important to me. Comments are appreciated.

## 2023-04-08 NOTE — Progress Notes (Signed)
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 This was treated with VP shunt. He has resultant spastic quadriparesis, contractures, poor vision, evidence of an axial collection of mixed type in the right frontal-temporal-parietal region and possible infarction of the right brain with encephalomalacia of the left brain, as well as intermittent episode of agitation. He is taking and tolerating Depakote and Carbatrol for his seizure disorder. He is cared for at home by his parents, but his father has significant health problems that is limiting his ability to provide physical care    Due to his medical condition, he is indefinitely incontinent of stool and urine.  It is medically necessary for him to use diapers, underpads, and gloves to assist with hygiene and skin integrity.    Today's concerns: Parents are concerned about his weight loss. They report that he sometimes won't eat at all and that sometimes he eats a few bites and refuses more. He is a picky eater and tends to consume only a few specific foods.  Parents are conflicted about being able to "force" him to consume more foods.  Gery has been otherwise generally healthy since he was last seen. No health concerns today other than previously mentioned.  Review of systems: Please see HPI for neurologic and other pertinent review of systems. Otherwise all other systems were reviewed and were negative.  Problem List: Patient Active Problem List   Diagnosis  Date Noted   Abnormal barium swallow 11/14/2022   Increased nutritional needs 11/14/2022   Underweight 11/14/2022   Dysphagia 08/09/2022   Protein-calorie malnutrition (HCC) 08/09/2022   Picky eater 08/09/2022   Decreased motor strength 11/12/2021   Thrombocytopenia (HCC) 08/02/2021   Seasonal allergic rhinitis due to pollen 08/02/2021   Cachexia (HCC) 08/02/2021   Loss of weight 09/22/2020   Oppositional behavior 09/22/2020   Restlessness and agitation 04/20/2015   Moderate intellectual disabilities 02/24/2013   History of congenital brain abnormality 02/24/2013   Obstructive hydrocephalus (HCC) 02/24/2013   Congenital quadriplegia (HCC) 02/24/2013   Generalized convulsive epilepsy (HCC) 02/24/2013   Partial epilepsy with impairment of consciousness (HCC) 02/24/2013     Past Medical History:  Diagnosis Date   Anxiety    Cerebral palsy (HCC)    Congenital quadriparesis (HCC)    Neuromuscular disorder (HCC)    Seizure (HCC)    Seizures (HCC)    last seizure 3 yrs ago    Past medical history comments: See HPI Copied from previous record: He presented at few months of age with massive hydrocephalus.  He required urgent placement of a ventriculoperitoneal shunt.  He has experienced revisions in the past.  Brain images in 2007 showed an extra-axial collection of mixed type in the right frontotemporal parietal region with possible infarction in the right brain and significant encephalomalacia of the left brain.  Ventricles were well decompressed.   A CT scan of the brain and shunt series Mar 04, 2013 showed microcephaly with marked diffuse thickening of the calvarium and marked enlargement of the mastoid sinus bilaterally. Left frontal shunt catheter  extends to the region of the third ventricle and is unchanged. No hydrocephalus. There is cerebellar atrophy bilaterally. 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 Brainstem is small. There is atrophy of the  left occipital lobe. Agenesis of the corpus callosum.   Large extra-axial fluid collection on the right measures 4.6 x 11.1 cm. This has low density centrally and the wall has calcified since the prior study. The fluid collection has matured and become better defined since the prior study. There is some mass effect on the right cerebral hemisphere. No midline shift to the left. No acute hemorrhage. Shunt tubing was intact from the ventricles to the abdomen.  Surgical history: Past Surgical History:  Procedure Laterality Date   BRAIN SURGERY     Shunt placed when he was a yr. old   I & D EXTREMITY Bilateral 04/29/2014   Procedure: IRRIGATION AND DEBRIDEMENT EXTREMITY;  Surgeon: Eldred Manges, MD;  Location: [facility] OR;  Service: Orthopedics;  Laterality: Bilateral;  Right and Left Hamstring Lenghening and Fiberglass Casting   LEG TENDON SURGERY Bilateral approx 6 yrs ago     Family history: family history includes Cancer in his paternal grandfather and paternal grandmother; Diabetes in his father and mother; Early death in his paternal grandfather and paternal grandmother; Healthy in his mother; Heart disease in his father, maternal grandfather, and mother; Hyperlipidemia in his father, maternal grandfather, and mother; Hypertension in his father, maternal grandfather, and maternal grandmother; Intellectual disability in his maternal grandfather; Learning disabilities in his father; Stomach cancer in his paternal grandfather and paternal grandmother; Throat cancer in his paternal grandfather.   Social history: Social History   Socioeconomic History   Marital status: Single    Spouse name: Not on file   Number of children: 0   Years of education: Not on file   Highest education level: Not on file  Occupational History   Occupation: disbale  Tobacco Use   Smoking status: Never    Passive exposure: Never   Smokeless tobacco: Never  Vaping Use   Vaping Use: Not on file  Substance and Sexual  Activity   Alcohol use: No   Drug use: No   Sexual activity: Never  Other Topics Concern   Not on file  Social History Narrative   Leo live at home with mom and dad.   He enjoys going outside, and riding.    Social Determinants of Health   Financial Resource Strain: Not on file  Food Insecurity: Not on file  Transportation Needs: Not on file  Physical Activity: Not on file  Stress: Not on file  Social Connections: Not on file  Intimate Partner Violence: Not on file    Past/failed meds:  Allergies: Allergies  Allergen Reactions   Penicillins Other (See Comments)    Right side of body started shaking     Vancomycin Rash    Immunizations: Immunization History  Administered Date(s) Administered   COVID-19, mRNA, vaccine(Comirnaty)12 years and older 08/14/2022   Influenza Inj Mdck Quad With Preservative 07/17/2022   Influenza-Unspecified 07/24/2021   PFIZER(Purple Top)SARS-COV-2 Vaccination 03/13/2020, 04/04/2020, 10/05/2020    Diagnostics/Screenings: Copied from previous record: 08/22/2022 - Barium Swallow - Limited study as pt was apprehensive to swallow the barium *despite it being sweetened with artificial sweetener* and several options provided.  In addition, suboptimal view due to pt leaning forward out of flouro view. Patient's mother had to help feed Ivin Booty - as he would only accept *reluctantly* intake from  her.  Mild oral dysphagia most c/b impaired oral control resulting in premature spillage of boluses into pharynx and lack of adequate mastication.  Pt did not adequate masticate with sweet potato despite mom rubbing his chin in effort to induce mastication. Minimal vertical mastication observed with pt and prolonged lingual press to palate to transit bolus into pharynx it- consistent with intellectual disability. Pudding bolus rapidly spilled (dumped) into pharynx with swallow triggering at vallecular space.  SLP had pt use his bottle/straw during the test - with him  accepting only small sips but adequate airway protection.  Premature spillage to pyriform sinus with thin observed prior to swallow trigger.     Pharyngeal swallow was strong without any retention fortunately.  Willilam did not cough or gag during po trials of MBS - but he only accepted approx. 7 boluses total.       Upon esophageal sweep, pt noted to be kyphotic - which raises concerns for potential multifactorial dysphagia including esophageal issues or GI issues contributing to gagging.  Please see video loops of esophageal sweep.       03/12/2021 - CT head wo contrast - 1. Outside of the chest, a second new discontinuity of the shunt tubing is identified overlying the left mastoid. 2. But there is no ventriculomegaly. And the non contrast CT appearance of the brain is stable since 2018.   05/23/2017 - CT head wo contrast - No acute finding. Chronic microcephaly and brain atrophy. Chronic calcified subdural collection on the right, not significantly changed since 03/04/2013. No evidence of shunt malfunction. Sinuses clear  Physical Exam: BP 108/64 (BP Location: Right Arm, Patient Position: Sitting, Cuff Size: Small)   Wt 70 lb 12.8 oz (32.1 kg)   BMI 21.60 kg/m   Wt Readings from Last 3 Encounters:  04/08/23 70 lb 12.8 oz (32.1 kg)  02/21/23 75 lb (34 kg)  01/08/23 60 lb (27.2 kg)    General: thin but well developed, well nourished young man, seated in wheelchair, in no evident distress Head: normocephalic and atraumatic. Oropharynx benign. No dysmorphic features. Neck: supple Cardiovascular: regular rate and rhythm, no murmurs. Respiratory: clear to auscultation bilaterally Abdomen: bowel sounds present all four quadrants, abdomen soft, non-tender, non-distended. No hepatosplenomegaly or masses palpated. Musculoskeletal: no skeletal deformities. Has kyphoscoliosis. Has muscle wasting and flexion contractures Skin: no rashes or neurocutaneous lesions  Neurologic Exam Mental Status:  awake and fully alert. Has no spoken language but does have some signs. Impassive face. Resistant to invasions into his space Cranial Nerves: fundoscopic exam - red reflex present.  Unable to fully visualize fundus.  Pupils equal briskly reactive to light.  Turns to localize faces and objects in the periphery. Turns to localize sounds in the periphery. Facial movements are symmetric.  Motor: spastic quadriparesis  Sensory: withdrawal x 4 Coordination: unable to adequately assess due to patient's inability to participate in examination. No dysmetria when reaching for objects. Gait and Station: unable to stand and bear weight.  Impression: Loss of weight  Moderate intellectual disabilities  History of congenital brain abnormality  Congenital quadriplegia (HCC)  Obstructive hydrocephalus (HCC)  Generalized convulsive epilepsy (HCC)  Partial epilepsy with impairment of consciousness (HCC)  Cachexia (HCC)  Dysphagia, unspecified type  Picky eater  Increased nutritional needs   Recommendations for plan of care: The patient's previous Epic records were reviewed. No recent diagnostic studies to be reviewed with the patient. I talked at some length his Kavontae's parents about his weight loss. He ate some potato  chips and some vanilla wafers in the exam room, and drank water in a cup with a straw. He had no choking or coughing with these foods. I explained that he likely needs a feeding tube in order to avoid further weight loss because of his limited diet and picky eating. His father were divided on their opinion about that. After discussion, they agreed to keep a food diary for 2 weeks and return for follow up weight check. His parents know to take him to ER if he will not consume food at all or if his urinary output declines.  The medication list was reviewed and reconciled. No changes were made in the prescribed medications today. A complete medication list was provided to the  patient.   Allergies as of 04/08/2023       Reactions   Penicillins Other (See Comments)   Right side of body started shaking    Vancomycin Rash        Medication List        Accurate as of April 08, 2023 11:59 PM. If you have any questions, ask your nurse or doctor.          carbamazepine 300 MG 12 hr capsule Commonly known as: Carbatrol Take 1 capsule (300 mg total) by mouth 2 (two) times daily.   cetirizine HCl 5 MG/5ML Soln Commonly known as: Zyrtec Take 10 mLs (10 mg total) by mouth daily.   Depakote Sprinkles 125 MG capsule Generic drug: divalproex TAKE 5 CAPSULES BY MOUTH TWICE DAILY   diazepam 1 MG/ML solution Commonly known as: VALIUM Give 1 ml at bedtime and give 1ml by mouth up to twice per day as needed for restlessness   diazepam 10 MG Gel Commonly known as: DIASTAT ACUDIAL INSERT 10MG  RECTALLY AT ONSET OF SEIZURE   famotidine 40 MG/5ML suspension Commonly known as: PEPCID Take 5 mLs (40 mg total) by mouth 2 (two) times daily.   ibuprofen 100 MG/5ML suspension Commonly known as: ADVIL Take by mouth.   Nutritional Supplement Plus Liqd 1 Ensure Clear or Boost Breeze given PO daily.   nystatin-triamcinolone ointment Commonly known as: MYCOLOG Apply 1 Application topically 2 (two) times daily.   ondansetron 4 MG/5ML solution Commonly known as: ZOFRAN Take 5 mLs (4 mg total) by mouth every 8 (eight) hours as needed for nausea or vomiting.      Total time spent with the patient was 35 minutes, of which 50% or more was spent in counseling and coordination of care.  Elveria Rising NP-C [facility] [address], [address] [place], [place] [address] Ph. [phone] Fax [phone]

## 2023-04-09 ENCOUNTER — Telehealth: Payer: Self-pay

## 2023-04-09 NOTE — Telephone Encounter (Signed)
Transition Care Management Unsuccessful Follow-up Telephone Call  Date of discharge and from where:  04/04/2023 [facility]  Attempts:  1st Attempt  Reason for unsuccessful TCM follow-up call:  Left voice message  [facility]  [facility] Population Health Community Resource Care Guide   [email or web address]  ?? [phone]   Website: [email or web address]  [email or web address]

## 2023-04-09 NOTE — Telephone Encounter (Signed)
Transition Care Management Unsuccessful Follow-up Telephone Call  Date of discharge and from where:  04/04/2023 [facility]  Attempts:  2nd Attempt  Reason for unsuccessful TCM follow-up call:  Left voice message  Jacqueline Spofford [facility]  [facility] Population Health Community Resource Care Guide   [email or web address]  ?? [phone]   Website: [email or web address]  [email or web address]

## 2023-04-11 ENCOUNTER — Encounter (INDEPENDENT_AMBULATORY_CARE_PROVIDER_SITE_OTHER): Payer: Self-pay | Admitting: Family

## 2023-04-22 ENCOUNTER — Ambulatory Visit (INDEPENDENT_AMBULATORY_CARE_PROVIDER_SITE_OTHER): Payer: 59 | Admitting: Family

## 2023-04-22 ENCOUNTER — Encounter (INDEPENDENT_AMBULATORY_CARE_PROVIDER_SITE_OTHER): Payer: Self-pay | Admitting: Family

## 2023-04-22 VITALS — BP 110/78 | HR 100 | Wt <= 1120 oz

## 2023-04-22 DIAGNOSIS — R6339 Other feeding difficulties: Secondary | ICD-10-CM | POA: Diagnosis not present

## 2023-04-22 DIAGNOSIS — G911 Obstructive hydrocephalus: Secondary | ICD-10-CM

## 2023-04-22 DIAGNOSIS — F71 Moderate intellectual disabilities: Secondary | ICD-10-CM | POA: Diagnosis not present

## 2023-04-22 DIAGNOSIS — E46 Unspecified protein-calorie malnutrition: Secondary | ICD-10-CM | POA: Diagnosis not present

## 2023-04-22 DIAGNOSIS — G40209 Localization-related (focal) (partial) symptomatic epilepsy and epileptic syndromes with complex partial seizures, not intractable, without status epilepticus: Secondary | ICD-10-CM

## 2023-04-22 DIAGNOSIS — G808 Other cerebral palsy: Secondary | ICD-10-CM

## 2023-04-22 DIAGNOSIS — R634 Abnormal weight loss: Secondary | ICD-10-CM | POA: Diagnosis not present

## 2023-04-22 DIAGNOSIS — R64 Cachexia: Secondary | ICD-10-CM

## 2023-04-22 DIAGNOSIS — G40309 Generalized idiopathic epilepsy and epileptic syndromes, not intractable, without status epilepticus: Secondary | ICD-10-CM

## 2023-04-22 NOTE — Progress Notes (Signed)
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 This was treated with VP shunt. He has resultant spastic quadriparesis, contractures, poor vision, evidence of an axial collection of mixed type in the right frontal-temporal-parietal region and possible infarction of the right brain with encephalomalacia of the left brain, as well as intermittent episode of agitation. He is taking and tolerating Depakote and Carbatrol for his seizure disorder. He is cared for at home by his parents, but his father has significant health problems that is limiting his ability to provide physical care    Due to his medical condition, he is indefinitely incontinent of stool and urine.  It is medically necessary for him to use diapers, underpads, and gloves to assist with hygiene and skin integrity.    Today's concerns: Theodore Lewis returns today for ongoing concerns about weight loss. His parents kept a food diary for the last 2 weeks as I had asked, and it reveals that Theodore Lewis is not consuming sufficient calories to maintain or gain weight. Mom notes that he often refuses food and beverages, and that she is unable to induce him to eat. Theodore Lewis has steadfastly refused supplements such as Boost or Ensure. Parents are unhappy with his lack of progress with oral feedings. Mom notes that she was able to get him to drink some whole milk, but that he became constipated and then refused to eat.  Sometimes Mom has  difficulty getting him to take his medications, because they are administered in a bite of food.  Theodore Lewis has been otherwise generally healthy since he was last seen. No health concerns today other than previously mentioned.  Review of systems: Please see HPI for neurologic and other pertinent review of systems. Otherwise all other systems were reviewed and were negative.  Problem List: Patient Active Problem List   Diagnosis Date Noted   Abnormal barium swallow 11/14/2022   Increased nutritional needs 11/14/2022   Underweight 11/14/2022   Dysphagia 08/09/2022   Protein-calorie malnutrition (HCC) 08/09/2022   Picky eater 08/09/2022   Decreased motor strength 11/12/2021   Thrombocytopenia (HCC) 08/02/2021   Seasonal allergic rhinitis due to pollen 08/02/2021   Cachexia (HCC) 08/02/2021   Loss of weight 09/22/2020   Oppositional behavior 09/22/2020   Restlessness and agitation 04/20/2015   Moderate intellectual disabilities 02/24/2013   History of congenital brain abnormality 02/24/2013   Obstructive hydrocephalus (HCC) 02/24/2013   Congenital quadriplegia (HCC) 02/24/2013   Generalized convulsive epilepsy (HCC) 02/24/2013   Partial epilepsy with impairment of consciousness (HCC) 02/24/2013     Past Medical History:  Diagnosis Date   Anxiety    Cerebral palsy (HCC)    Congenital quadriparesis (HCC)    Neuromuscular disorder (HCC)    Seizure (HCC)    Seizures (HCC)    last seizure 3 yrs ago    Past medical history  comments: See HPI Copied from previous record: He presented at few months of age with massive hydrocephalus.  He required urgent placement of a ventriculoperitoneal shunt.  He has experienced revisions in the past.  Brain images in 2007 showed an extra-axial collection of mixed type in the right frontotemporal parietal region with possible infarction in the right brain and significant encephalomalacia of the left brain.  Ventricles were well decompressed.   A CT scan  of the brain and shunt series Mar 04, 2013 showed microcephaly with marked diffuse thickening of the calvarium and marked enlargement of the mastoid sinus bilaterally. Left frontal shunt catheter extends to the region of the third ventricle and is unchanged. No hydrocephalus. There is cerebellar atrophy bilaterally. 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 Brainstem is small. There is atrophy of the left occipital lobe. Agenesis of the corpus callosum.   Large extra-axial fluid collection on the right measures 4.6 x 11.1 cm. This has low density centrally and the wall has calcified since the prior study. The fluid collection has matured and become better defined since the prior study. There is some mass effect on the right cerebral hemisphere. No midline shift to the left. No acute hemorrhage. Shunt tubing was intact from the ventricles to the abdomen.  Surgical history: Past Surgical History:  Procedure Laterality Date   BRAIN SURGERY     Shunt placed when he was a yr. old   I & D EXTREMITY Bilateral 04/29/2014   Procedure: IRRIGATION AND DEBRIDEMENT EXTREMITY;  Surgeon: Eldred Manges, MD;  Location: [facility] OR;  Service: Orthopedics;  Laterality: Bilateral;  Right and Left Hamstring Lenghening and Fiberglass Casting   LEG TENDON SURGERY Bilateral approx 6 yrs ago     Family history: family history includes Cancer in his paternal grandfather and paternal grandmother; Diabetes in his father and mother; Early death in his paternal grandfather and paternal grandmother; Healthy in his mother; Heart disease in his father, maternal grandfather, and mother; Hyperlipidemia in his father, maternal grandfather, and mother; Hypertension in his father, maternal grandfather, and maternal grandmother; Intellectual disability in his maternal grandfather; Learning disabilities in his father; Stomach cancer in his paternal grandfather and paternal grandmother; Throat cancer in his paternal  grandfather.   Social history: Social History   Socioeconomic History   Marital status: Single    Spouse name: Not on file   Number of children: 0   Years of education: Not on file   Highest education level: Not on file  Occupational History   Occupation: disbale  Tobacco Use   Smoking status: Never    Passive exposure: Never   Smokeless tobacco: Never  Vaping Use   Vaping Use: Not on file  Substance and Sexual Activity   Alcohol use: No   Drug use: No   Sexual activity: Never  Other Topics Concern   Not on file  Social History Narrative   [place] live at home with mom and dad.   He enjoys going outside, and riding.    Social Determinants of Health   Financial Resource Strain: Not on file  Food Insecurity: Not on file  Transportation Needs: Not on file  Physical Activity: Not on file  Stress: Not on file  Social Connections: Not on file  Intimate Partner Violence: Not on file    Past/failed meds:  Allergies: Allergies  Allergen Reactions   Penicillins Other (See Comments)    Right side of body started shaking  Vancomycin Rash    Immunizations: Immunization History  Administered Date(s) Administered   COVID-19, mRNA, vaccine(Comirnaty)12 years and older 08/14/2022   Influenza Inj Mdck Quad With Preservative 07/17/2022   Influenza-Unspecified 07/24/2021   PFIZER(Purple Top)SARS-COV-2 Vaccination 03/13/2020, 04/04/2020, 10/05/2020    Diagnostics/Screenings: Copied from previous record: 08/22/2022 - Barium Swallow - Limited study as pt was apprehensive to swallow the barium *despite it being sweetened with artificial sweetener* and several options provided.  In addition, suboptimal view due to pt leaning forward out of flouro view. Patient's mother had to help feed Ivin Booty - as he would only accept *reluctantly* intake from her.  Mild oral dysphagia most c/b impaired oral control resulting in premature spillage of boluses into pharynx and lack of adequate  mastication.  Pt did not adequate masticate with sweet potato despite mom rubbing his chin in effort to induce mastication. Minimal vertical mastication observed with pt and prolonged lingual press to palate to transit bolus into pharynx it- consistent with intellectual disability. Pudding bolus rapidly spilled (dumped) into pharynx with swallow triggering at vallecular space.  SLP had pt use his bottle/straw during the test - with him accepting only small sips but adequate airway protection.  Premature spillage to pyriform sinus with thin observed prior to swallow trigger.     Pharyngeal swallow was strong without any retention fortunately. Vidal did not cough or gag during po trials of MBS - but he only accepted approx. 7 boluses total.       Upon esophageal sweep, pt noted to be kyphotic - which raises concerns for potential multifactorial dysphagia including esophageal issues or GI issues contributing to gagging.  Please see video loops of esophageal sweep.       03/12/2021 - CT head wo contrast - 1. Outside of the chest, a second new discontinuity of the shunt tubing is identified overlying the left mastoid. 2. But there is no ventriculomegaly. And the non contrast CT appearance of the brain is stable since 2018.   05/23/2017 - CT head wo contrast - No acute finding. Chronic microcephaly and brain atrophy. Chronic calcified subdural collection on the right, not significantly changed since 03/04/2013. No evidence of shunt malfunction. Sinuses clear  Physical Exam: BP 110/78 (BP Location: Right Arm, Patient Position: Sitting, Cuff Size: Small)   Pulse 100   Wt 69 lb 12.8 oz (31.7 kg)   BMI 21.30 kg/m   Wt Readings from Last 3 Encounters:  04/22/23 69 lb 12.8 oz (31.7 kg)  04/08/23 70 lb 12.8 oz (32.1 kg)  02/21/23 75 lb (34 kg)    General: thin but well developed, well nourished young man, seated in wheelchair, in no evident distress Head: normocephalic and atraumatic. Oropharynx benign. No  dysmorphic features. Neck: supple Cardiovascular: regular rate and rhythm, no murmurs. Respiratory: clear to auscultation bilaterally Abdomen: bowel sounds present all four quadrants, abdomen soft, non-tender, non-distended.  Musculoskeletal: no skeletal deformities. Has kyphoscoliosis. Has muscle wasting and flexion contractures Skin: no rashes or neurocutaneous lesions  Neurologic Exam Mental Status: awake and fully alert. Has no spoken language but does have a few signs. Impassive face. Resistant to invasions into his space Cranial Nerves: fundoscopic exam - red reflex present.  Unable to fully visualize fundus.  Pupils equal briskly reactive to light.  Turns to localize faces and objects in the periphery. Turns to localize sounds in the periphery. Facial movements are symmetric Motor: spastic quadriparesis  Sensory: withdrawal x 4 Coordination: unable to adequately assess due to patient's inability to participate  in examination. No dysmetria when reaching for objects. Gait and Station: unable to  stand and bear weight.   Impression: Loss of weight - Plan: Ambulatory referral to General Surgery  Moderate intellectual disabilities - Plan: Ambulatory referral to General Surgery  Obstructive hydrocephalus (HCC) - Plan: Ambulatory referral to General Surgery  Congenital quadriplegia (HCC) - Plan: Ambulatory referral to General Surgery  Generalized convulsive epilepsy (HCC) - Plan: Ambulatory referral to General Surgery  Partial epilepsy with impairment of consciousness (HCC) - Plan: Ambulatory referral to General Surgery  Cachexia (HCC) - Plan: Ambulatory referral to General Surgery  Protein-calorie malnutrition, unspecified severity (HCC) - Plan: Ambulatory referral to General Surgery  Picky eater - Plan: Ambulatory referral to General Surgery   Recommendations for plan of care: The patient's previous Epic records were reviewed. No recent diagnostic studies to be reviewed with the  patient. I talked with Uziah's parent today about his ongoing weight loss despite their efforts to get him to eat. I recommended referral to surgery for placement of gastrostomy tube. Parents were disappointed with his progress with consuming food and agreed to this plan. I will see him back in follow up after the gastrostomy tube has been placed.   Plan until next visit: Referral placed for surgery Continue medications as prescribed  Continue working with Ivin Booty to consume calorie rich foods until the g-tube has been placed. Call for questions or concerns.   The medication list was reviewed and reconciled. No changes were made in the prescribed medications today. A complete medication list was provided to the patient.  Orders Placed This Encounter  Procedures   Ambulatory referral to General Surgery    Referral Priority:   Routine    Referral Type:   Surgical    Referral Reason:   Specialty Services Required    Requested Specialty:   General Surgery    Number of Visits Requested:   1   Allergies as of 04/22/2023       Reactions   Penicillins Other (See Comments)   Right side of body started shaking    Vancomycin Rash        Medication List        Accurate as of April 22, 2023  2:43 PM. If you have any questions, ask your nurse or doctor.          carbamazepine 300 MG 12 hr capsule Commonly known as: Carbatrol Take 1 capsule (300 mg total) by mouth 2 (two) times daily.   cetirizine HCl 5 MG/5ML Soln Commonly known as: Zyrtec Take 10 mLs (10 mg total) by mouth daily.   Depakote Sprinkles 125 MG capsule Generic drug: divalproex TAKE 5 CAPSULES BY MOUTH TWICE DAILY   diazepam 1 MG/ML solution Commonly known as: VALIUM Give 1 ml at bedtime and give 1ml by mouth up to twice per day as needed for restlessness   diazepam 10 MG Gel Commonly known as: DIASTAT ACUDIAL INSERT 10MG  RECTALLY AT ONSET OF SEIZURE   famotidine 40 MG/5ML suspension Commonly known as:  PEPCID Take 5 mLs (40 mg total) by mouth 2 (two) times daily.   ibuprofen 100 MG/5ML suspension Commonly known as: ADVIL Take by mouth.   Nutritional Supplement Plus Liqd 1 Ensure Clear or Boost Breeze given PO daily.   nystatin-triamcinolone ointment Commonly known as: MYCOLOG Apply 1 Application topically 2 (two) times daily.   ondansetron 4 MG/5ML solution Commonly known as: ZOFRAN Take 5 mLs (4 mg total) by mouth every 8 (eight) hours as  needed for nausea or vomiting.      Total time spent with the patient was 25 minutes, of which 50% or more was spent in counseling and coordination of care.  Elveria Rising NP-C [facility] [address] [place], [place] [address] Ph. [phone] Fax [phone]

## 2023-04-22 NOTE — Patient Instructions (Addendum)
It was a pleasure to see you today!  Instructions for you until your next appointment are as follows: I placed the referral for [facility] Continue Taro's medications as prescribed Continue working with him to consume calorie rich foods until the g-tube has been placed.  Please sign up for MyChart if you have not done so. Please plan to return for follow up after the g-tube has been placed or sooner if needed.  Feel free to contact our office during normal business hours at [phone] with questions or concerns. If there is no answer or the call is outside business hours, please leave a message and our clinic staff will call you back within the next business day.  If you have an urgent concern, please stay on the line for our after-hours answering service and ask for the on-call neurologist.     I also encourage you to use MyChart to communicate with me more directly. If you have not yet signed up for MyChart within [facility], the front desk staff can help you. However, please note that this inbox is NOT monitored on nights or weekends, and response can take up to 2 business days.  Urgent matters should be discussed with the on-call pediatric neurologist.   At [facility], we are committed to providing exceptional care. You will receive a patient satisfaction survey through text or email regarding your visit today. Your opinion is important to me. Comments are appreciated.

## 2023-04-25 ENCOUNTER — Encounter (INDEPENDENT_AMBULATORY_CARE_PROVIDER_SITE_OTHER): Payer: Self-pay

## 2023-05-02 ENCOUNTER — Other Ambulatory Visit: Payer: Self-pay | Admitting: Gastroenterology

## 2023-05-02 DIAGNOSIS — K219 Gastro-esophageal reflux disease without esophagitis: Secondary | ICD-10-CM

## 2023-05-13 ENCOUNTER — Other Ambulatory Visit: Payer: Self-pay | Admitting: Internal Medicine

## 2023-05-13 ENCOUNTER — Telehealth: Payer: Self-pay | Admitting: Internal Medicine

## 2023-05-13 ENCOUNTER — Telehealth (INDEPENDENT_AMBULATORY_CARE_PROVIDER_SITE_OTHER): Payer: Self-pay | Admitting: Family

## 2023-05-13 DIAGNOSIS — U071 COVID-19: Secondary | ICD-10-CM | POA: Insufficient documentation

## 2023-05-13 MED ORDER — NIRMATRELVIR/RITONAVIR (PAXLOVID)TABLET: 3.0000 | ORAL_TABLET | Freq: Two times a day (BID) | ORAL | 0 refills | Status: AC

## 2023-05-13 NOTE — Telephone Encounter (Signed)
Pt's mother, [name], has been informed rx was sent.

## 2023-05-13 NOTE — Telephone Encounter (Signed)
  Name of who is calling: Gertha Calkin  Caller's Relationship to Patient: Mom  Best contact number: [phone]  Provider they see: Elveria Rising   Reason for call: Called saying they wont make it to the appt on 7/11 because of covid, says they can rs for another day in office if needed. Mom says if appt is needed they can do virtual. Also would like to speak to tina about a testing the pt has coming up the week of the 23rd.     PRESCRIPTION REFILL ONLY  Name of prescription:  Pharmacy:

## 2023-05-13 NOTE — Telephone Encounter (Signed)
Pt tested positive with Covid today at [facility]. Pt mother wants to know can Dr. Yetta Barre send something in for him but he can only have liquid medication and he is only 69lbs. Please advise.

## 2023-05-13 NOTE — Telephone Encounter (Signed)
RX sent

## 2023-05-14 NOTE — Telephone Encounter (Signed)
Is she comfortable with molnupiravir?

## 2023-05-14 NOTE — Telephone Encounter (Signed)
I called and spoke with Mom. Theodore Lewis and his parents all have Covid. I cancelled the appointments for tomorrow and encouraged Mom to keep the appointment with the surgery on 05/27/23. Mom asked about Paxlovid which was recommended by his PCP and I told her that I am concerned about her giving it because of [name]'s weight and his already reduced appetite and unwillingness to eat. I instructed Mom that [name] would need to be seen in the ED if he stops eating altogether and/or if he has any respiratory distress. Mom agreed with these plans. TG

## 2023-05-14 NOTE — Telephone Encounter (Signed)
Patient's mother does not feel comfortable giving the patient  nirmatrelvir/ritonavir (PAXLOVID) 20 x 150 MG & 10 x 100MG  TABS . She is concerned about being able to give it to him and also his weight. She would like to know if there is an alternative. She would like a call back at [phone].

## 2023-05-15 ENCOUNTER — Ambulatory Visit (INDEPENDENT_AMBULATORY_CARE_PROVIDER_SITE_OTHER): Payer: Self-pay | Admitting: Dietician

## 2023-05-15 ENCOUNTER — Ambulatory Visit (INDEPENDENT_AMBULATORY_CARE_PROVIDER_SITE_OTHER): Payer: 59 | Admitting: Family

## 2023-05-15 NOTE — Telephone Encounter (Signed)
Pt's mother, [name], would like to know can the medication be crushed as pt can not swallow pills. If not, should she continue to manage supotoms with OTC pain and cold liquid medications? Please advise.

## 2023-05-16 NOTE — Telephone Encounter (Signed)
Pt's mother, [name], has been informed ok to crush paxlovid.

## 2023-05-20 ENCOUNTER — Telehealth: Payer: Self-pay | Admitting: Internal Medicine

## 2023-05-20 NOTE — Telephone Encounter (Signed)
Patient's mother Theodore Lewis called and said the patient has had diarrhea for the last few days. When offered an appointment, she declined since there was nothing immediate. She would like to know what is advised by the provider to help get the diarrhea to stop. Best callback is [phone].

## 2023-05-20 NOTE — Telephone Encounter (Signed)
 imodium

## 2023-05-20 NOTE — Telephone Encounter (Signed)
Notified pt w/ MD response../l b 

## 2023-05-27 DIAGNOSIS — E46 Unspecified protein-calorie malnutrition: Secondary | ICD-10-CM | POA: Diagnosis not present

## 2023-05-27 DIAGNOSIS — G809 Cerebral palsy, unspecified: Secondary | ICD-10-CM | POA: Diagnosis not present

## 2023-06-06 ENCOUNTER — Ambulatory Visit (INDEPENDENT_AMBULATORY_CARE_PROVIDER_SITE_OTHER): Payer: 59 | Admitting: Family

## 2023-06-06 ENCOUNTER — Encounter (INDEPENDENT_AMBULATORY_CARE_PROVIDER_SITE_OTHER): Payer: Self-pay | Admitting: Family

## 2023-06-06 VITALS — Wt 71.0 lb

## 2023-06-06 DIAGNOSIS — G808 Other cerebral palsy: Secondary | ICD-10-CM | POA: Diagnosis not present

## 2023-06-06 DIAGNOSIS — G40209 Localization-related (focal) (partial) symptomatic epilepsy and epileptic syndromes with complex partial seizures, not intractable, without status epilepticus: Secondary | ICD-10-CM

## 2023-06-06 DIAGNOSIS — F71 Moderate intellectual disabilities: Secondary | ICD-10-CM

## 2023-06-06 DIAGNOSIS — G911 Obstructive hydrocephalus: Secondary | ICD-10-CM | POA: Diagnosis not present

## 2023-06-06 DIAGNOSIS — R6339 Other feeding difficulties: Secondary | ICD-10-CM

## 2023-06-06 DIAGNOSIS — R636 Underweight: Secondary | ICD-10-CM

## 2023-06-06 DIAGNOSIS — G40309 Generalized idiopathic epilepsy and epileptic syndromes, not intractable, without status epilepticus: Secondary | ICD-10-CM | POA: Diagnosis not present

## 2023-06-06 DIAGNOSIS — E46 Unspecified protein-calorie malnutrition: Secondary | ICD-10-CM | POA: Diagnosis not present

## 2023-06-06 MED ORDER — DUOCAL PO POWD: ORAL | Status: DC

## 2023-06-06 NOTE — Patient Instructions (Addendum)
It was a pleasure to see you today!  Instructions for you until your next appointment are as follows: Start Duocal as we discussed today. Start by putting 1 scoop into foods or liquids 2 times per day for 3 days, then 1 scoop into foods or liquids 3 times per day for 3 days, then 1 scoop into foods or liquids 4 times per day.  Please sign up for MyChart if you have not done so. Please plan to return for follow up on Friday July 04, 2023 @ 11AM or sooner if needed.  Feel free to contact our office during normal business hours at [phone] with questions or concerns. If there is no answer or the call is outside business hours, please leave a message and our clinic staff will call you back within the next business day.  If you have an urgent concern, please stay on the line for our after-hours answering service and ask for the on-call neurologist.     I also encourage you to use MyChart to communicate with me more directly. If you have not yet signed up for MyChart within [facility], the front desk staff can help you. However, please note that this inbox is NOT monitored on nights or weekends, and response can take up to 2 business days.  Urgent matters should be discussed with the on-call pediatric neurologist.   At [facility], we are committed to providing exceptional care. You will receive a patient satisfaction survey through text or email regarding your visit today. Your opinion is important to me. Comments are appreciated.

## 2023-06-06 NOTE — Progress Notes (Signed)
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 This was treated with VP shunt. He has resultant spastic quadriparesis, contractures, poor vision, evidence of an axial collection of mixed type in the right frontal-temporal-parietal region and possible infarction of the right brain with encephalomalacia of the left brain, as well as intermittent episode of agitation. He is taking and tolerating Depakote and Carbatrol for his seizure disorder. He is cared for at home by his parents, but his father has significant health problems that is limiting his ability to provide physical care    Due to his medical condition, he is indefinitely incontinent of stool and urine.  It is medically necessary for him to use diapers, underpads, and gloves to assist with hygiene and skin integrity.    Today's concerns: Returns for follow up for loss of weight and picky eating.  Was seen by surgery for consideration of PEG tube but decision was made to wait on placement of the device.  Parents report that [name] has been eating more since the appointment with the surgeon. Mom reports that yesterday he ate 2 hashbrowns from [employer], 2 chicken nuggets, some sips of a soft drink, and then last night signed asking for food and ate 6 chicken nuggets. She has been giving his medication in bites of applesauce or pudding. He ate a few Fritos chips during the visit today.  [name] has been otherwise generally healthy since he was last seen. No health  concerns today other than previously mentioned.  Review of systems: Please see HPI for neurologic and other pertinent review of systems. Otherwise all other systems were reviewed and were negative.  Problem List: Patient Active Problem List   Diagnosis Date Noted   COVID 05/13/2023   Abnormal barium swallow 11/14/2022   Increased nutritional needs 11/14/2022   Underweight 11/14/2022   Dysphagia 08/09/2022   Protein-calorie malnutrition (HCC) 08/09/2022   Picky eater 08/09/2022   Decreased motor strength 11/12/2021   Thrombocytopenia (HCC) 08/02/2021   Seasonal allergic rhinitis due to pollen 08/02/2021   Cachexia (HCC) 08/02/2021   Loss of weight 09/22/2020   Oppositional behavior 09/22/2020   Restlessness and agitation 04/20/2015   Moderate intellectual disabilities 02/24/2013   History of congenital brain abnormality 02/24/2013   Obstructive hydrocephalus (HCC) 02/24/2013   Congenital quadriplegia (HCC) 02/24/2013   Generalized convulsive epilepsy (HCC) 02/24/2013   Partial epilepsy with impairment of consciousness (HCC) 02/24/2013     Past Medical History:  Diagnosis Date   Anxiety    Cerebral palsy (HCC)    Congenital quadriparesis (HCC)    Neuromuscular disorder (HCC)    Seizure (HCC)    Seizures (HCC)    last seizure 3 yrs ago    Past medical history comments: See HPI Copied from previous record: He presented at few months of age with massive hydrocephalus.  He required urgent placement of a ventriculoperitoneal shunt.  He has experienced revisions in the past.  Brain images in 2007 showed an extra-axial collection of mixed type in the right frontotemporal parietal region with possible infarction in  the right brain and significant encephalomalacia of the left brain.  Ventricles were well decompressed.   A CT scan of the brain and shunt series Mar 04, 2013 showed microcephaly with marked diffuse thickening of the calvarium and marked enlargement of the mastoid sinus  bilaterally. Left frontal shunt catheter extends to the region of the third ventricle and is unchanged. No hydrocephalus. There is cerebellar atrophy bilaterally. 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 Brainstem is small. There is atrophy of the left occipital lobe. Agenesis of the corpus callosum.   Large extra-axial fluid collection on the right measures 4.6 x 11.1 cm. This has low density centrally and the wall has calcified since the prior study. The fluid collection has matured and become better defined since the prior study. There is some mass effect on the right cerebral hemisphere. No midline shift to the left. No acute hemorrhage. Shunt tubing was intact from the ventricles to the abdomen.  Surgical history: Past Surgical History:  Procedure Laterality Date   BRAIN SURGERY     Shunt placed when he was a yr. old   I & D EXTREMITY Bilateral 04/29/2014   Procedure: IRRIGATION AND DEBRIDEMENT EXTREMITY;  Surgeon: Eldred Manges, MD;  Location: [facility] OR;  Service: Orthopedics;  Laterality: Bilateral;  Right and Left Hamstring Lenghening and Fiberglass Casting   LEG TENDON SURGERY Bilateral approx 6 yrs ago     Family history: family history includes Cancer in his paternal grandfather and paternal grandmother; Diabetes in his father and mother; Early death in his paternal grandfather and paternal grandmother; Healthy in his mother; Heart disease in his father, maternal grandfather, and mother; Hyperlipidemia in his father, maternal grandfather, and mother; Hypertension in his father, maternal grandfather, and maternal grandmother; Intellectual disability in his maternal grandfather; Learning disabilities in his father; Stomach cancer in his paternal grandfather and paternal grandmother; Throat cancer in his paternal grandfather.   Social history: Social History   Socioeconomic History   Marital status: Single    Spouse name: Not on file   Number of children: 0    Years of education: Not on file   Highest education level: Not on file  Occupational History   Occupation: disbale  Tobacco Use   Smoking status: Never    Passive exposure: Never   Smokeless tobacco: Never  Vaping Use   Vaping status: Not on file  Substance and Sexual Activity   Alcohol use: No   Drug use: No   Sexual activity: Never  Other Topics Concern   Not on file  Social History Narrative   [place] live at home with mom and dad.   He enjoys going outside, and riding.    Social Determinants of Health   Financial Resource Strain: Not on file  Food Insecurity: Not on file  Transportation Needs: Not on file  Physical Activity: Not on file  Stress: Not on file  Social Connections: Not on file  Intimate Partner Violence: Not on file   Past/failed meds:  Allergies: Allergies  Allergen Reactions   Penicillins Other (See Comments)    Right side of body started shaking     Vancomycin Rash    Immunizations: Immunization History  Administered Date(s) Administered   COVID-19, mRNA, vaccine(Comirnaty)12 years and older 08/14/2022   Influenza Inj Mdck Quad With Preservative 07/17/2022   Influenza-Unspecified 07/24/2021   PFIZER(Purple Top)SARS-COV-2 Vaccination 03/13/2020, 04/04/2020, 10/05/2020    Diagnostics/Screenings: Copied from previous record: 08/22/2022 - Barium Swallow - Limited study  as pt was apprehensive to swallow the barium *despite it being sweetened with artificial sweetener* and several options provided.  In addition, suboptimal view due to pt leaning forward out of flouro view. Patient's mother had to help feed Ivin Booty - as he would only accept *reluctantly* intake from her.  Mild oral dysphagia most c/b impaired oral control resulting in premature spillage of boluses into pharynx and lack of adequate mastication.  Pt did not adequate masticate with sweet potato despite mom rubbing his chin in effort to induce mastication. Minimal vertical mastication  observed with pt and prolonged lingual press to palate to transit bolus into pharynx it- consistent with intellectual disability. Pudding bolus rapidly spilled (dumped) into pharynx with swallow triggering at vallecular space.  SLP had pt use his bottle/straw during the test - with him accepting only small sips but adequate airway protection.  Premature spillage to pyriform sinus with thin observed prior to swallow trigger.     Pharyngeal swallow was strong without any retention fortunately. Justis did not cough or gag during po trials of MBS - but he only accepted approx. 7 boluses total.       Upon esophageal sweep, pt noted to be kyphotic - which raises concerns for potential multifactorial dysphagia including esophageal issues or GI issues contributing to gagging.  Please see video loops of esophageal sweep.       03/12/2021 - CT head wo contrast - 1. Outside of the chest, a second new discontinuity of the shunt tubing is identified overlying the left mastoid. 2. But there is no ventriculomegaly. And the non contrast CT appearance of the brain is stable since 2018.   05/23/2017 - CT head wo contrast - No acute finding. Chronic microcephaly and brain atrophy. Chronic calcified subdural collection on the right, not significantly changed since 03/04/2013. No evidence of shunt malfunction. Sinuses clear  Physical Exam: Wt 71 lb (32.2 kg)   BMI 21.67 kg/m   Wt Readings from Last 3 Encounters:  06/06/23 71 lb (32.2 kg)  04/22/23 69 lb 12.8 oz (31.7 kg)  04/08/23 70 lb 12.8 oz (32.1 kg)  General: thin but well developed, well nourished young man, seated in wheelchair, in no evident distress Head: normocephalic and atraumatic. Oropharynx difficult to examine but appears benign. No dysmorphic features. Neck: supple Cardiovascular: regular rate and rhythm, no murmurs. Respiratory: clear to auscultation bilaterally Abdomen: bowel sounds present all four quadrants, abdomen soft, non-tender,  non-distended. No hepatosplenomegaly or masses palpated.Gastrostomy tube in place size  Fr cm AMT MiniOne balloon button, site clean and dry Musculoskeletal: no skeletal deformities. Has kyphoscoliosis. Has muscle wasting and flexion contractures Skin: no rashes or neurocutaneous lesions  Neurologic Exam Mental Status: awake and fully alert. Has no spoken language but has some signs. Impassive face. Unable to follow instructions or participate in examination. Resistant to invasions into his space. Cranial Nerves: fundoscopic exam - red reflex present.  Unable to fully visualize fundus.  Pupils equal briskly reactive to light.  Turns to localize faces and objects in the periphery. Turns to localize sounds in the periphery. Facial movements are symmetric. Motor: spastic quadriparesis  Sensory: withdrawal x 4 Coordination: unable to adequately assess due to patient's inability to participate in examination. No dysmetria when reaching for objects. Gait and Station: unable to stand and bear weight.   Impression: Underweight - Plan: Nutritional Supplements (DUOCAL) POWD  Moderate intellectual disabilities - Plan: Nutritional Supplements (DUOCAL) POWD  Obstructive hydrocephalus (HCC)  Congenital quadriplegia (HCC)  Generalized convulsive epilepsy (  HCC)  Partial epilepsy with impairment of consciousness (HCC)  Protein-calorie malnutrition, unspecified severity (HCC) - Plan: Nutritional Supplements (DUOCAL) POWD  Picky eater - Plan: Nutritional Supplements (DUOCAL) POWD   Recommendations for plan of care: The patient's previous Epic records were reviewed. No recent diagnostic studies to be reviewed with the patient. I talked with Cebastian's parents about the surgeon visit and about Keats's ongoing problems with being underweight. I recommended a trial of Duocal and explained to Mom how to mix it into food. If she is able to get him to consume the Duocal in food, I will send a prescription to  [facility].   Plan until next visit: Start Duocal supplementation Continue working on eating meals as a family and encouraging Faolan to try foods being served Continue medications as prescribed  Call for questions or concerns Return in 4 weeks for weight check  The medication list was reviewed and reconciled. No changes were made in the prescribed medications today. A complete medication list was provided to the patient.   Allergies as of 06/06/2023       Reactions   Penicillins Other (See Comments)   Right side of body started shaking    Vancomycin Rash        Medication List        Accurate as of June 06, 2023  4:37 PM. If you have any questions, ask your nurse or doctor.          carbamazepine 300 MG 12 hr capsule Commonly known as: Carbatrol Take 1 capsule (300 mg total) by mouth 2 (two) times daily.   cetirizine HCl 5 MG/5ML Soln Commonly known as: Zyrtec Take 10 mLs (10 mg total) by mouth daily.   Depakote Sprinkles 125 MG capsule Generic drug: divalproex TAKE 5 CAPSULES BY MOUTH TWICE DAILY   diazepam 1 MG/ML solution Commonly known as: VALIUM Give 1 ml at bedtime and give 1ml by mouth up to twice per day as needed for restlessness   diazepam 10 MG Gel Commonly known as: DIASTAT ACUDIAL INSERT 10MG  RECTALLY AT ONSET OF SEIZURE   famotidine 40 MG/5ML suspension Commonly known as: PEPCID SHAKE LIQUID AND TAKE 5 ML(40 MG) BY MOUTH TWICE DAILY   ibuprofen 100 MG/5ML suspension Commonly known as: ADVIL Take by mouth.   Nutritional Supplement Plus Liqd 1 Ensure Clear or Boost Breeze given PO daily. What changed: Another medication with the same name was added. Make sure you understand how and when to take each. Changed by: Malva Limes Powd Give 1 scoop into food or liquid 4 times per day What changed: You were already taking a medication with the same name, and this prescription was added. Make sure you understand how and when to take  each. Changed by: Elveria Rising   nystatin-triamcinolone ointment Commonly known as: MYCOLOG Apply 1 Application topically 2 (two) times daily.   ondansetron 4 MG/5ML solution Commonly known as: ZOFRAN Take 5 mLs (4 mg total) by mouth every 8 (eight) hours as needed for nausea or vomiting.       I discussed this patient's care with John Giovanni RD to develop this assessment and plan.  Total time spent with the patient was 25 minutes, of which 50% or more was spent in counseling and coordination of care.  Elveria Rising NP-C [facility] [address] [place], [place] [address] Ph. [phone] Fax [phone]

## 2023-06-17 ENCOUNTER — Ambulatory Visit (INDEPENDENT_AMBULATORY_CARE_PROVIDER_SITE_OTHER): Payer: 59

## 2023-06-17 DIAGNOSIS — Z0000 Encounter for general adult medical examination without abnormal findings: Secondary | ICD-10-CM

## 2023-06-17 NOTE — Patient Instructions (Addendum)
Theodore Lewis , Thank you for taking time to come for your Medicare Wellness Visit. I appreciate your ongoing commitment to your health goals. Please review the following plan we discussed and let me know if I can assist you in the future.   Referrals/Orders/Follow-Ups/Clinician Recommendations: no  This is a list of the screening recommended for you and due dates:  Health Maintenance  Topic Date Due   COVID-19 Vaccine (5 - 2023-24 season) 10/09/2022   Flu Shot  06/05/2023   Medicare Annual Wellness Visit  06/16/2024   Hepatitis C Screening  Completed   HIV Screening  Completed   HPV Vaccine  Aged Out   DTaP/Tdap/Td vaccine  Discontinued    Advanced directives: Mother has paperwork and is currently in the process of completing.  Next Medicare Annual Wellness Visit scheduled for next year: Yes  Preventive Care 50-71 Years Old, Male Preventive care refers to lifestyle choices and visits with your health care provider that can promote health and wellness. Preventive care visits are also called wellness exams. What can I expect for my preventive care visit? Counseling During your preventive care visit, your health care provider may ask about your: Medical history, including: Past medical problems. Family medical history. Current health, including: Emotional well-being. Home life and relationship well-being. Sexual activity. Lifestyle, including: Alcohol, nicotine or tobacco, and drug use. Access to firearms. Diet, exercise, and sleep habits. Safety issues such as seatbelt and bike helmet use. Sunscreen use. Work and work Astronomer. Physical exam Your health care provider may check your: Height and weight. These may be used to calculate your BMI (body mass index). BMI is a measurement that tells if you are at a healthy weight. Waist circumference. This measures the distance around your waistline. This measurement also tells if you are at a healthy weight and may help predict  your risk of certain diseases, such as type 2 diabetes and high blood pressure. Heart rate and blood pressure. Body temperature. Skin for abnormal spots. What immunizations do I need? Vaccines are usually given at various ages, according to a schedule. Your health care provider will recommend vaccines for you based on your age, medical history, and lifestyle or other factors, such as travel or where you work. What tests do I need? Screening Your health care provider may recommend screening tests for certain conditions. This may include: Lipid and cholesterol levels. Diabetes screening. This is done by checking your blood sugar (glucose) after you have not eaten for a while (fasting). Hepatitis B test. Hepatitis C test. HIV (human immunodeficiency virus) test. STI (sexually transmitted infection) testing, if you are at risk. Talk with your health care provider about your test results, treatment options, and if necessary, the need for more tests. Follow these instructions at home: Eating and drinking  Eat a healthy diet that includes fresh fruits and vegetables, whole grains, lean protein, and low-fat dairy products. Drink enough fluid to keep your urine pale yellow. Take vitamin and mineral supplements as recommended by your health care provider. Do not drink alcohol if your health care provider tells you not to drink. If you drink alcohol: Limit how much you have to 0-2 drinks a day. Know how much alcohol is in your drink. In the [place] one drink equals one 12 oz bottle of beer (355 mL), one 5 oz glass of wine (148 mL), or one 1 oz glass of hard liquor (44 mL). Lifestyle Brush your teeth every morning and night with fluoride toothpaste. Floss one time each  day. Exercise for at least 30 minutes 5 or more days each week. Do not use any products that contain nicotine or tobacco. These products include cigarettes, chewing tobacco, and vaping devices, such as e-cigarettes. If you need help  quitting, ask your health care provider. Do not use drugs. If you are sexually active, practice safe sex. Use a condom or other form of protection to prevent STIs. Find healthy ways to manage stress, such as: Meditation, yoga, or listening to music. Journaling. Talking to a trusted person. Spending time with friends and family. Minimize exposure to UV radiation to reduce your risk of skin cancer. Safety Always wear your seat belt while driving or riding in a vehicle. Do not drive: If you have been drinking alcohol. Do not ride with someone who has been drinking. If you have been using any mind-altering substances or drugs. While texting. When you are tired or distracted. Wear a helmet and other protective equipment during sports activities. If you have firearms in your house, make sure you follow all gun safety procedures. Seek help if you have been physically or sexually abused. What's next? Go to your health care provider once a year for an annual wellness visit. Ask your health care provider how often you should have your eyes and teeth checked. Stay up to date on all vaccines. This information is not intended to replace advice given to you by your health care provider. Make sure you discuss any questions you have with your health care provider. Document Revised: 04/18/2021 Document Reviewed: 04/18/2021 Elsevier Patient Education  2022 [employer].

## 2023-06-17 NOTE — Progress Notes (Signed)
Subjective:   Theodore Lewis is a 27 y.o. male who presents for Medicare Annual/Subsequent preventive examination.  Visit Complete: Virtual  I connected with  Theodore Lewis's mother on 06/17/23 by a audio enabled telemedicine application and verified that I am speaking with the correct person using two identifiers.  Patient Location: Home  Provider Location: Office/Clinic  I discussed the limitations of evaluation and management by telemedicine. The patient expressed understanding and agreed to proceed.  Vital Signs: Unable to obtain new vitals due to this being a telehealth visit. Patient is confined to wheelchair.   Review of Systems     Cardiac Risk Factors include: sedentary lifestyle;family history of premature cardiovascular disease     Objective:    Today's Vitals   06/17/23 1004  PainSc: 0-No pain   There is no height or weight on file to calculate BMI.     06/17/2023   10:09 AM 02/21/2023   12:11 PM 08/29/2022    1:17 PM 03/12/2021    4:03 AM  Advanced Directives  Does Patient Have a Medical Advance Directive? No No No No  Would patient like information on creating a medical advance directive? No - Patient declined   No - Patient declined    Current Medications (verified) Outpatient Encounter Medications as of 06/17/2023  Medication Sig   carbamazepine (CARBATROL) 300 MG 12 hr capsule Take 1 capsule (300 mg total) by mouth 2 (two) times daily.   cetirizine HCl (ZYRTEC) 5 MG/5ML SOLN Take 10 mLs (10 mg total) by mouth daily.   DEPAKOTE SPRINKLES 125 MG capsule TAKE 5 CAPSULES BY MOUTH TWICE DAILY   diazepam (DIASTAT ACUDIAL) 10 MG GEL INSERT 10MG  RECTALLY AT ONSET OF SEIZURE   famotidine (PEPCID) 40 MG/5ML suspension SHAKE LIQUID AND TAKE 5 ML(40 MG) BY MOUTH TWICE DAILY   ibuprofen (ADVIL) 100 MG/5ML suspension Take by mouth.   Nutritional Supplements (DUOCAL) POWD Give 1 scoop into food or liquid 4 times per day   nystatin-triamcinolone ointment  (MYCOLOG) Apply 1 Application topically 2 (two) times daily.   diazepam (VALIUM) 1 MG/ML solution Give 1 ml at bedtime and give 1ml by mouth up to twice per day as needed for restlessness (Patient not taking: Reported on 04/08/2023)   Nutritional Supplements (NUTRITIONAL SUPPLEMENT PLUS) LIQD 1 Ensure Clear or Boost Breeze given PO daily. (Patient not taking: Reported on 06/06/2023)   ondansetron (ZOFRAN) 4 MG/5ML solution Take 5 mLs (4 mg total) by mouth every 8 (eight) hours as needed for nausea or vomiting. (Patient not taking: Reported on 06/06/2023)   No facility-administered encounter medications on file as of 06/17/2023.    Allergies (verified) Penicillins and Vancomycin   History: Past Medical History:  Diagnosis Date   Anxiety    Cerebral palsy (HCC)    Congenital quadriparesis (HCC)    Neuromuscular disorder (HCC)    Seizure (HCC)    Seizures (HCC)    last seizure 3 yrs ago   Past Surgical History:  Procedure Laterality Date   BRAIN SURGERY     Shunt placed when he was a yr. old   I & D EXTREMITY Bilateral 04/29/2014   Procedure: IRRIGATION AND DEBRIDEMENT EXTREMITY;  Surgeon: Eldred Manges, MD;  Location: [facility] OR;  Service: Orthopedics;  Laterality: Bilateral;  Right and Left Hamstring Lenghening and Fiberglass Casting   LEG TENDON SURGERY Bilateral approx 6 yrs ago   Family History  Problem Relation Age of Onset   Hyperlipidemia Mother    Heart  disease Mother    Diabetes Mother    Healthy Mother    Learning disabilities Father    Hypertension Father    Hyperlipidemia Father    Heart disease Father    Diabetes Father    Hypertension Maternal Grandmother    Intellectual disability Maternal Grandfather    Hypertension Maternal Grandfather    Hyperlipidemia Maternal Grandfather    Heart disease Maternal Grandfather    Cancer Paternal Grandmother    Early death Paternal Grandmother    Stomach cancer Paternal Grandmother        Died at 75   Cancer Paternal Grandfather     Early death Paternal Grandfather    Throat cancer Paternal Grandfather        Died at 41   Stomach cancer Paternal Grandfather    Liver disease Neg Hx    Esophageal cancer Neg Hx    Colon cancer Neg Hx    Social History   Socioeconomic History   Marital status: Single    Spouse name: Not on file   Number of children: 0   Years of education: Not on file   Highest education level: Not on file  Occupational History   Occupation: disbale  Tobacco Use   Smoking status: Never    Passive exposure: Never   Smokeless tobacco: Never  Vaping Use   Vaping status: Not on file  Substance and Sexual Activity   Alcohol use: No   Drug use: No   Sexual activity: Never  Other Topics Concern   Not on file  Social History Narrative   Sandi live at home with mom and dad.   He enjoys going outside, and riding.    Social Determinants of Health   Financial Resource Strain: Low Risk  (06/17/2023)   Overall Financial Resource Strain (CARDIA)    Difficulty of Paying Living Expenses: Not hard at all  Food Insecurity: No Food Insecurity (06/17/2023)   Hunger Vital Sign    Worried About Running Out of Food in the Last Year: Never true    Ran Out of Food in the Last Year: Never true  Transportation Needs: No Transportation Needs (06/17/2023)   PRAPARE - Administrator, Civil Service (Medical): No    Lack of Transportation (Non-Medical): No  Physical Activity: Inactive (06/17/2023)   Exercise Vital Sign    Days of Exercise per Week: 0 days    Minutes of Exercise per Session: 0 min  Stress: Patient Unable To Answer (06/17/2023)   [employer] of Occupational Health - Occupational Stress Questionnaire    Feeling of Stress : Patient unable to answer  Social Connections: Patient Unable To Answer (06/17/2023)   Social Connection and Isolation Panel [NHANES]    Frequency of Communication with Friends and Family: Patient unable to answer    Frequency of Social Gatherings with Friends and  Family: Patient unable to answer    Attends Religious Services: Patient unable to answer    Active Member of Clubs or Organizations: Patient unable to answer    Attends Banker Meetings: Patient unable to answer    Marital Status: Patient unable to answer    Tobacco Counseling Counseling given: Not Answered   Clinical Intake:  Pre-visit preparation completed: Yes  Pain : No/denies pain Pain Score: 0-No pain     BMI - recorded:  (patient is confined to wheelchair) Nutritional Risks: None Diabetes: No  How often do you need to have someone help you when you read  instructions, pamphlets, or other written materials from your doctor or pharmacy?: 1 - Never What is the last grade level you completed in school?: N/A  Interpreter Needed?: No  Information entered by :: Rubens Cranston N. Aziz Slape, LPN.   Activities of Daily Living    06/17/2023   10:13 AM  In your present state of health, do you have any difficulty performing the following activities:  Hearing? 0  Vision? 0  Difficulty concentrating or making decisions? 1  Walking or climbing stairs? 1  Dressing or bathing? 1  Doing errands, shopping? 1  Preparing Food and eating ? Y  Using the Toilet? Y  In the past six months, have you accidently leaked urine? Y  Do you have problems with loss of bowel control? Y  Managing your Medications? Y  Managing your Finances? Y  Housekeeping or managing your Housekeeping? Y    Patient Care Team: Etta Grandchild, MD as PCP - General (Internal Medicine)  Indicate any recent Medical Services you may have received from other than [facility] providers in the past year (date may be approximate).     Assessment:   This is a routine wellness examination for Theodore Lewis.  Hearing/Vision screen Hearing Screening - Comments:: Patient denied any hearing difficulty.   No hearing aids.  Vision Screening - Comments:: Patient does wear corrective lenses/contacts.  No Annual eye  exam.   Dietary issues and exercise activities discussed:     Goals Addressed   None   Depression Screen    06/17/2023   10:13 AM 11/21/2022    1:44 PM 04/02/2022    2:50 PM 08/02/2021   10:16 AM  PHQ 2/9 Scores  PHQ - 2 Score 0     PHQ- 9 Score 0     Exception Documentation  Medical reason Medical reason Medical reason    Fall Risk    06/17/2023   10:09 AM  Fall Risk   Falls in the past year? 0  Number falls in past yr: 0  Injury with Fall? 0  Risk for fall due to : No Fall Risks  Follow up Falls prevention discussed    MEDICARE RISK AT HOME:  Medicare Risk at Home - 06/17/23 1009     Any stairs in or around the home? No    If so, are there any without handrails? No    Home free of loose throw rugs in walkways, pet beds, electrical cords, etc? Yes    Adequate lighting in your home to reduce risk of falls? Yes    Life alert? No    Use of a cane, walker or w/c? Yes    Grab bars in the bathroom? Yes    Shower chair or bench in shower? Yes    Elevated toilet seat or a handicapped toilet? Yes             TIMED UP AND GO:  Was the test performed?  No    Cognitive Function:        Immunizations Immunization History  Administered Date(s) Administered   COVID-19, mRNA, vaccine(Comirnaty)12 years and older 08/14/2022   Influenza Inj Mdck Quad With Preservative 07/17/2022   Influenza-Unspecified 07/24/2021   PFIZER(Purple Top)SARS-COV-2 Vaccination 03/13/2020, 04/04/2020, 10/05/2020    TDAP status: Due, Education has been provided regarding the importance of this vaccine. Advised may receive this vaccine at local pharmacy or Health Dept. Aware to provide a copy of the vaccination record if obtained from local pharmacy or Health Dept. Verbalized acceptance and  understanding.  Flu Vaccine status: Up to date  Pneumococcal vaccine status: Declined,  Education has been provided regarding the importance of this vaccine but patient still declined. Advised may  receive this vaccine at local pharmacy or Health Dept. Aware to provide a copy of the vaccination record if obtained from local pharmacy or Health Dept. Verbalized acceptance and understanding.   Covid-19 vaccine status: Completed vaccines  Qualifies for Shingles Vaccine? No   Zostavax completed No   Shingrix Completed?: No.    Education has been provided regarding the importance of this vaccine. Patient has been advised to call insurance company to determine out of pocket expense if they have not yet received this vaccine. Advised may also receive vaccine at local pharmacy or Health Dept. Verbalized acceptance and understanding.  Screening Tests Health Maintenance  Topic Date Due   COVID-19 Vaccine (5 - 2023-24 season) 10/09/2022   INFLUENZA VACCINE  06/05/2023   Medicare Annual Wellness (AWV)  06/16/2024   Hepatitis C Screening  Completed   HIV Screening  Completed   HPV VACCINES  Aged Out   DTaP/Tdap/Td  Discontinued    Health Maintenance  Health Maintenance Due  Topic Date Due   COVID-19 Vaccine (5 - 2023-24 season) 10/09/2022   INFLUENZA VACCINE  06/05/2023    Colorectal cancer screening: Not of age.  Lung Cancer Screening: (Low Dose CT Chest recommended if Age 64-80 years, 20 pack-year currently smoking OR have quit w/in 15years.) does not qualify.   Lung Cancer Screening Referral: no  Additional Screening:  Hepatitis C Screening: does qualify; Completed 11/21/2022  Vision Screening: Recommended annual ophthalmology exams for early detection of glaucoma and other disorders of the eye. Is the patient up to date with their annual eye exam?  No  Who is the provider or what is the name of the office in which the patient attends annual eye exams? Declined If pt is not established with a provider, would they like to be referred to a provider to establish care? No .   Dental Screening: Recommended annual dental exams for proper oral hygiene  Diabetic Foot Exam:  N/A  Community Resource Referral / Chronic Care Management: CRR required this visit?  No   CCM required this visit?  No     Plan:     I have personally reviewed and noted the following in the patient's chart:   Medical and social history Use of alcohol, tobacco or illicit drugs  Current medications and supplements including opioid prescriptions. Patient is not currently taking opioid prescriptions. Functional ability and status Nutritional status Physical activity Advanced directives List of other physicians Hospitalizations, surgeries, and ER visits in previous 12 months Vitals Screenings to include cognitive, depression, and falls Referrals and appointments  In addition, I have reviewed and discussed with patient certain preventive protocols, quality metrics, and best practice recommendations. A written personalized care plan for preventive services as well as general preventive health recommendations were provided to patient.     Mickeal Needy, LPN   1/61/0960   After Visit Summary: (Mail) Due to this being a telephonic visit, the after visit summary with patients personalized plan was offered to patient via mail   Nurse Notes: Patient unable to complete/perform visit due to cognitive impairment/intellectual disabilities. Mother completed AWV for this patient.

## 2023-06-18 ENCOUNTER — Other Ambulatory Visit (INDEPENDENT_AMBULATORY_CARE_PROVIDER_SITE_OTHER): Payer: Self-pay | Admitting: Family

## 2023-06-18 ENCOUNTER — Telehealth (INDEPENDENT_AMBULATORY_CARE_PROVIDER_SITE_OTHER): Payer: Self-pay | Admitting: Family

## 2023-06-18 DIAGNOSIS — G40309 Generalized idiopathic epilepsy and epileptic syndromes, not intractable, without status epilepticus: Secondary | ICD-10-CM

## 2023-06-18 DIAGNOSIS — G40209 Localization-related (focal) (partial) symptomatic epilepsy and epileptic syndromes with complex partial seizures, not intractable, without status epilepticus: Secondary | ICD-10-CM

## 2023-06-18 NOTE — Telephone Encounter (Signed)
The Rx was refaxed and confirmation received. TG

## 2023-06-18 NOTE — Telephone Encounter (Signed)
  Name of who is calling: Gertha Calkin  Caller's Relationship to Patient: Mom  Best contact number: [phone]  Provider they see: Elveria Rising  Reason for call: Called in regards to Depakote medication. Mom said she called the pharmacy and they informed her the medicine cannot be refilled because they were instructed he would not be on that medication and also would not be using that pharmacy anymore. Mom stated she was not aware of any new medication or a new pharmacy so she just needs a call back to clarify some things.      PRESCRIPTION REFILL ONLY  Name of prescription:  Pharmacy:

## 2023-06-18 NOTE — Telephone Encounter (Signed)
It was faxed at 11:25AM to [facility] on [facility]. I can send it again. Please verify which pharmacy Mom wants it to go to. Thanks, Inetta Fermo

## 2023-06-18 NOTE — Telephone Encounter (Signed)
Contacted patients mother. Verified patient name and DOB with mom.  Mom verified pharmacy. She would like the RX sent to the [facility] on [address].  SS, CCMA

## 2023-07-04 ENCOUNTER — Encounter (INDEPENDENT_AMBULATORY_CARE_PROVIDER_SITE_OTHER): Payer: Self-pay | Admitting: Family

## 2023-07-04 ENCOUNTER — Ambulatory Visit (INDEPENDENT_AMBULATORY_CARE_PROVIDER_SITE_OTHER): Payer: 59 | Admitting: Family

## 2023-07-04 VITALS — Wt <= 1120 oz

## 2023-07-04 DIAGNOSIS — F71 Moderate intellectual disabilities: Secondary | ICD-10-CM | POA: Diagnosis not present

## 2023-07-04 DIAGNOSIS — G911 Obstructive hydrocephalus: Secondary | ICD-10-CM | POA: Diagnosis not present

## 2023-07-04 DIAGNOSIS — R6339 Other feeding difficulties: Secondary | ICD-10-CM

## 2023-07-04 DIAGNOSIS — G808 Other cerebral palsy: Secondary | ICD-10-CM | POA: Diagnosis not present

## 2023-07-04 DIAGNOSIS — R634 Abnormal weight loss: Secondary | ICD-10-CM

## 2023-07-04 DIAGNOSIS — G4700 Insomnia, unspecified: Secondary | ICD-10-CM

## 2023-07-04 DIAGNOSIS — R636 Underweight: Secondary | ICD-10-CM

## 2023-07-04 DIAGNOSIS — E46 Unspecified protein-calorie malnutrition: Secondary | ICD-10-CM | POA: Diagnosis not present

## 2023-07-04 DIAGNOSIS — R4689 Other symptoms and signs involving appearance and behavior: Secondary | ICD-10-CM

## 2023-07-04 DIAGNOSIS — Z87728 Personal history of other specified (corrected) congenital malformations of nervous system and sense organs: Secondary | ICD-10-CM

## 2023-07-04 DIAGNOSIS — G40209 Localization-related (focal) (partial) symptomatic epilepsy and epileptic syndromes with complex partial seizures, not intractable, without status epilepticus: Secondary | ICD-10-CM

## 2023-07-04 DIAGNOSIS — G40309 Generalized idiopathic epilepsy and epileptic syndromes, not intractable, without status epilepticus: Secondary | ICD-10-CM

## 2023-07-04 MED ORDER — HYDROXYZINE HCL 10 MG PO TABS: ORAL_TABLET | ORAL | 0 refills | Status: DC

## 2023-07-04 NOTE — Progress Notes (Signed)
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 This was treated with VP shunt. He has resultant spastic quadriparesis, contractures, poor vision, evidence of an axial collection of mixed type in the right frontal-temporal-parietal region and possible infarction of the right brain with encephalomalacia of the left brain, as well as intermittent episode of agitation. He is taking and tolerating Depakote and Carbatrol for his seizure disorder. He is cared for at home by his parents, but his father has significant health problems that is limiting his ability to provide physical care    Due to his medical condition, he is indefinitely incontinent of stool and urine.  It is medically necessary for him to use diapers, underpads, and gloves to assist with hygiene and skin integrity.    Today's concerns: He is seen today for weight check. Unfortunately he has lost 3 lbs in the last month.  Mom reports that Fleming was doing better with eating foods offered, including foods with a scoop of Duocal in them, until about 10 days ago when he started refusing foods.  Mom notes that he had a dental appointment around that time and that she was told that he had heavy tartar on his teeth and would need sedated dental cleaning. Mom wonders if he experienced dental pain after the examination causing him to stop eating.  Mom notes that Daemyn ate 6 chicken nuggets yesterday along with a few bites of applesauce that he gets when she gives his  medication. He had 3 or 4 chicken nuggets this morning.  Dad doesn't understand why Caellum is refusing foods when "he knows that he has to eat to be healthy".  Mom asked about insomnia today. She said that Mearle is only sleeping from about 11:30PM to 2:30AM, and is increasingly oppositional during the day.  Sharief has been otherwise generally healthy since he was last seen. No health concerns today other than previously mentioned.  Review of systems: Please see HPI for neurologic and other pertinent review of systems. Otherwise all other systems were reviewed and were negative.  Problem List: Patient Active Problem List   Diagnosis Date Noted   COVID 05/13/2023   Abnormal barium swallow 11/14/2022   Increased nutritional needs 11/14/2022   Underweight 11/14/2022   Dysphagia 08/09/2022   Protein-calorie malnutrition (HCC) 08/09/2022   Picky eater 08/09/2022   Decreased motor strength 11/12/2021   Thrombocytopenia (HCC) 08/02/2021   Seasonal allergic rhinitis due to pollen 08/02/2021   Cachexia (HCC) 08/02/2021   Loss of weight 09/22/2020   Oppositional behavior 09/22/2020   Restlessness and agitation 04/20/2015   Moderate intellectual disabilities 02/24/2013   History of congenital brain abnormality 02/24/2013   Obstructive hydrocephalus (HCC) 02/24/2013   Congenital quadriplegia (HCC) 02/24/2013   Generalized convulsive epilepsy (HCC) 02/24/2013   Partial epilepsy with impairment of consciousness (HCC) 02/24/2013     Past Medical History:  Diagnosis Date   Anxiety    Cerebral palsy (HCC)    Congenital quadriparesis (HCC)    Neuromuscular disorder (HCC)    Seizure (HCC)  Seizures (HCC)    last seizure 3 yrs ago    Past medical history comments: See HPI Copied from previous record: He presented at few months of age with massive hydrocephalus.  He required urgent placement of a ventriculoperitoneal shunt.  He has experienced revisions in the past.  Brain images in 2007  showed an extra-axial collection of mixed type in the right frontotemporal parietal region with possible infarction in the right brain and significant encephalomalacia of the left brain.  Ventricles were well decompressed.   A CT scan of the brain and shunt series Mar 04, 2013 showed microcephaly with marked diffuse thickening of the calvarium and marked enlargement of the mastoid sinus bilaterally. Left frontal shunt catheter extends to the region of the third ventricle and is unchanged. No hydrocephalus. There is cerebellar atrophy bilaterally. 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 Brainstem is small. There is atrophy of the left occipital lobe. Agenesis of the corpus callosum.   Large extra-axial fluid collection on the right measures 4.6 x 11.1 cm. This has low density centrally and the wall has calcified since the prior study. The fluid collection has matured and become better defined since the prior study. There is some mass effect on the right cerebral hemisphere. No midline shift to the left. No acute hemorrhage. Shunt tubing was intact from the ventricles to the abdomen.  Surgical history: Past Surgical History:  Procedure Laterality Date   BRAIN SURGERY     Shunt placed when he was a yr. old   I & D EXTREMITY Bilateral 04/29/2014   Procedure: IRRIGATION AND DEBRIDEMENT EXTREMITY;  Surgeon: Eldred Manges, MD;  Location: [facility] OR;  Service: Orthopedics;  Laterality: Bilateral;  Right and Left Hamstring Lenghening and Fiberglass Casting   LEG TENDON SURGERY Bilateral approx 6 yrs ago     Family history: family history includes Cancer in his paternal grandfather and paternal grandmother; Diabetes in his father and mother; Early death in his paternal grandfather and paternal grandmother; Healthy in his mother; Heart disease in his father, maternal grandfather, and mother; Hyperlipidemia in his father, maternal grandfather, and mother; Hypertension in his father,  maternal grandfather, and maternal grandmother; Intellectual disability in his maternal grandfather; Learning disabilities in his father; Stomach cancer in his paternal grandfather and paternal grandmother; Throat cancer in his paternal grandfather.   Social history: Social History   Socioeconomic History   Marital status: Single    Spouse name: Not on file   Number of children: 0   Years of education: Not on file   Highest education level: Not on file  Occupational History   Occupation: disbale  Tobacco Use   Smoking status: Never    Passive exposure: Never   Smokeless tobacco: Never  Vaping Use   Vaping status: Not on file  Substance and Sexual Activity   Alcohol use: No   Drug use: No   Sexual activity: Never  Other Topics Concern   Not on file  Social History Narrative   Estle live at home with mom and dad.   He enjoys going outside, and riding.    Social Determinants of Health   Financial Resource Strain: Low Risk  (06/17/2023)   Overall Financial Resource Strain (CARDIA)    Difficulty of Paying Living Expenses: Not hard at all  Food Insecurity: No Food Insecurity (06/17/2023)   Hunger Vital Sign    Worried About Running Out of Food in the Last Year: Never true  Ran Out of Food in the Last Year: Never true  Transportation Needs: No Transportation Needs (06/17/2023)   PRAPARE - Administrator, Civil Service (Medical): No    Lack of Transportation (Non-Medical): No  Physical Activity: Inactive (06/17/2023)   Exercise Vital Sign    Days of Exercise per Week: 0 days    Minutes of Exercise per Session: 0 min  Stress: Patient Unable To Answer (06/17/2023)   [employer] of Occupational Health - Occupational Stress Questionnaire    Feeling of Stress : Patient unable to answer  Social Connections: Patient Unable To Answer (06/17/2023)   Social Connection and Isolation Panel [NHANES]    Frequency of Communication with Friends and Family: Patient unable to  answer    Frequency of Social Gatherings with Friends and Family: Patient unable to answer    Attends Religious Services: Patient unable to answer    Active Member of Clubs or Organizations: Patient unable to answer    Attends Banker Meetings: Patient unable to answer    Marital Status: Patient unable to answer  Intimate Partner Violence: Not At Risk (06/17/2023)   Humiliation, Afraid, Rape, and Kick questionnaire    Fear of Current or Ex-Partner: No    Emotionally Abused: No    Physically Abused: No    Sexually Abused: No    Past/failed meds:  Allergies: Allergies  Allergen Reactions   Penicillins Other (See Comments)    Right side of body started shaking     Vancomycin Rash    Immunizations: Immunization History  Administered Date(s) Administered   COVID-19, mRNA, vaccine(Comirnaty)12 years and older 08/14/2022   Influenza Inj Mdck Quad With Preservative 07/17/2022   Influenza-Unspecified 07/24/2021   PFIZER(Purple Top)SARS-COV-2 Vaccination 03/13/2020, 04/04/2020, 10/05/2020    Diagnostics/Screenings: Copied from previous record: 08/22/2022 - Barium Swallow - Limited study as pt was apprehensive to swallow the barium *despite it being sweetened with artificial sweetener* and several options provided.  In addition, suboptimal view due to pt leaning forward out of flouro view. Patient's mother had to help feed Ivin Booty - as he would only accept *reluctantly* intake from her.  Mild oral dysphagia most c/b impaired oral control resulting in premature spillage of boluses into pharynx and lack of adequate mastication.  Pt did not adequate masticate with sweet potato despite mom rubbing his chin in effort to induce mastication. Minimal vertical mastication observed with pt and prolonged lingual press to palate to transit bolus into pharynx it- consistent with intellectual disability. Pudding bolus rapidly spilled (dumped) into pharynx with swallow triggering at vallecular  space.  SLP had pt use his bottle/straw during the test - with him accepting only small sips but adequate airway protection.  Premature spillage to pyriform sinus with thin observed prior to swallow trigger.     Pharyngeal swallow was strong without any retention fortunately. Georgiy did not cough or gag during po trials of MBS - but he only accepted approx. 7 boluses total.       Upon esophageal sweep, pt noted to be kyphotic - which raises concerns for potential multifactorial dysphagia including esophageal issues or GI issues contributing to gagging.  Please see video loops of esophageal sweep.       03/12/2021 - CT head wo contrast - 1. Outside of the chest, a second new discontinuity of the shunt tubing is identified overlying the left mastoid. 2. But there is no ventriculomegaly. And the non contrast CT appearance of the brain is stable since 2018.  05/23/2017 - CT head wo contrast - No acute finding. Chronic microcephaly and brain atrophy. Chronic calcified subdural collection on the right, not significantly changed since 03/04/2013. No evidence of shunt malfunction. Sinuses clear  Physical Exam: Wt 68 lb 9.6 oz (31.1 kg)   BMI 20.93 kg/m   Wt Readings from Last 3 Encounters:  07/04/23 68 lb 9.6 oz (31.1 kg)  06/06/23 71 lb (32.2 kg)  04/22/23 69 lb 12.8 oz (31.7 kg)   General: thin but well developed, well nourished young man, seated wheelchair, in no evident distress Head: normocephalic and atraumatic. Oropharynx difficult to examine but appears benign. No dysmorphic features. Neck: supple Cardiovascular: regular rate and rhythm, no murmurs. Respiratory: clear to auscultation bilaterally Abdomen: bowel sounds present all four quadrants, abdomen soft, non-tender, non-distended.  Musculoskeletal: no skeletal deformities or obvious scoliosis. Has muscle wasting and flexion contractures Skin: no rashes or neurocutaneous lesions  Neurologic Exam Mental Status: awake and fully alert.  Has no language but has some signs.  Smiles responsively. Unable to follow instructions or participate in examination. Resistant to invasions into his space. Cranial Nerves: fundoscopic exam - red reflex present.  Unable to fully visualize fundus.  Pupils equal briskly reactive to light.  Turns to localize faces and objects in the periphery. Turns to localize sounds in the periphery. Facial movements are symmetric Motor: spastic quadriparesis  Sensory: withdrawal x 4 Coordination: unable to adequately assess due to patient's inability to participate in examination. No dysmetria with reaching for objects. Gait and Station: unable to stand and bear weight.   Impression: Loss of weight  Insomnia, unspecified type - Plan: hydrOXYzine (ATARAX) 10 MG tablet  Moderate intellectual disabilities  Obstructive hydrocephalus (HCC)  Congenital quadriplegia (HCC)  Generalized convulsive epilepsy (HCC)  Partial epilepsy with impairment of consciousness (HCC)  Protein-calorie malnutrition, unspecified severity (HCC)  Picky eater  Underweight  History of congenital brain abnormality  Oppositional behavior  Oral aversion   Recommendations for plan of care: The patient's previous Epic records were reviewed. No recent diagnostic studies to be reviewed with the patient. I talked at length with Kiko's parents about his weight loss and his oral aversion. I remain very concerned about his nutritional status. Dad believes that Colum understands that he needs to eat to be healthy. I explained that he likely does not understand that concept. I reviewed that Khristopher must be offered food frequently and that the foods offered should be calorie dense. He was sipping a soft drink today and I again encouraged his parents to avoid this practice. Mom asked about an appetite supplement but declined to try Periactin when I reviewed possible side effects. I gave Mom another sample can of Duocal and reviewed with her  how to put it into foods.   We also talked about his insomnia and I recommended a trial of Hydroxyzine for that.   I will see him back in 4 weeks for a weight check.  Plan until next visit: Work on family meals with no distractions such as tablets or TV.  Work on Development worker, community of fact but firm with Bezalel and avoiding bribing him to get him to eat.  Give 1 scoop of Duocal in food items up to 4 times per day Start Hydroxyzine for insomnia.  Continue other medications as prescribed  Call for questions or concerns Return in 4 weeks for weight check  The medication list was reviewed and reconciled. I reviewed the changes that were made in the prescribed medications today. A  complete medication list was provided to the patient.  Allergies as of 07/04/2023       Reactions   Penicillins Other (See Comments)   Right side of body started shaking    Vancomycin Rash        Medication List        Accurate as of July 04, 2023  4:42 PM. If you have any questions, ask your nurse or doctor.          carbamazepine 300 MG 12 hr capsule Commonly known as: Carbatrol Take 1 capsule (300 mg total) by mouth 2 (two) times daily.   cetirizine HCl 5 MG/5ML Soln Commonly known as: Zyrtec Take 10 mLs (10 mg total) by mouth daily.   Depakote Sprinkles 125 MG capsule Generic drug: divalproex TAKE 5 CAPSULE BY MOUTH TWICE DAILY   diazepam 1 MG/ML solution Commonly known as: VALIUM Give 1 ml at bedtime and give 1ml by mouth up to twice per day as needed for restlessness   diazepam 10 MG Gel Commonly known as: DIASTAT ACUDIAL INSERT 10MG  RECTALLY AT ONSET OF SEIZURE   famotidine 40 MG/5ML suspension Commonly known as: PEPCID SHAKE LIQUID AND TAKE 5 ML(40 MG) BY MOUTH TWICE DAILY   hydrOXYzine 10 MG tablet Commonly known as: ATARAX Crush and give 1 tablet at bedtime Started by: Elveria Rising   ibuprofen 100 MG/5ML suspension Commonly known as: ADVIL Take by mouth.   Nutritional  Supplement Plus Liqd 1 Ensure Clear or Boost Breeze given PO daily.   Duocal Powd Give 1 scoop into food or liquid 4 times per day   nystatin-triamcinolone ointment Commonly known as: MYCOLOG Apply 1 Application topically 2 (two) times daily.   ondansetron 4 MG/5ML solution Commonly known as: ZOFRAN Take 5 mLs (4 mg total) by mouth every 8 (eight) hours as needed for nausea or vomiting.      Total time spent with the patient was 30 minutes, of which 50% or more was spent in counseling and coordination of care.  Elveria Rising NP-C [facility] [address], [address] [place], [place] [address] Ph. [phone] Fax [phone]

## 2023-07-04 NOTE — Patient Instructions (Addendum)
It was a pleasure to see you today!  Instructions for you until your next appointment are as follows: Continue to work on oral foods for Yoan as we discussed today. Make meal times more focused, with no distractions such as the tablet, TV etc and continue to offer a variety of foods.  Give 1 scoop of Duocal into foods up to 4 times per day I will send in a prescription for Hydoxyzine to get at bedtime for sleep Please sign up for MyChart if you have not done so. Please plan to return for follow up in 1 month or sooner if needed.  Feel free to contact our office during normal business hours at [phone] with questions or concerns. If there is no answer or the call is outside business hours, please leave a message and our clinic staff will call you back within the next business day.  If you have an urgent concern, please stay on the line for our after-hours answering service and ask for the on-call neurologist.     I also encourage you to use MyChart to communicate with me more directly. If you have not yet signed up for MyChart within [facility] the front desk staff can help you. However, please note that this inbox is NOT monitored on nights or weekends, and response can take up to 2 business days.  Urgent matters should be discussed with the on-call pediatric neurologist.   At [facility], we are committed to providing exceptional care. You will receive a patient satisfaction survey through text or email regarding your visit today. Your opinion is important to me. Comments are appreciated.

## 2023-08-01 ENCOUNTER — Telehealth (INDEPENDENT_AMBULATORY_CARE_PROVIDER_SITE_OTHER): Payer: Self-pay | Admitting: Family

## 2023-08-01 NOTE — Telephone Encounter (Signed)
Contacted patients mother. Verified patients name and DOB as well as mothers name.   Mom stated that she is unable to bring him in to get his weight checked so she cancelled the appointment or Monday, she stated that she will reschedule.   Mom also stated that she will try to weigh him at home on Monday and call us back with his weight.  I verbalized understanding of this.  SS, CCMA

## 2023-08-01 NOTE — Telephone Encounter (Signed)
  Name of who is calling: Justin Mend Relationship to Patient: mom  Best contact number: [phone]  Provider they see: Inetta Fermo  Reason for call: cxld appt but would like for Inetta Fermo to give her a call, mom has questions in reference to son's weight      PRESCRIPTION REFILL ONLY  Name of prescription:  Pharmacy:

## 2023-08-04 ENCOUNTER — Ambulatory Visit (INDEPENDENT_AMBULATORY_CARE_PROVIDER_SITE_OTHER): Payer: Self-pay | Admitting: Family

## 2023-08-21 ENCOUNTER — Encounter (HOSPITAL_COMMUNITY): Payer: Self-pay

## 2023-08-21 ENCOUNTER — Encounter: Payer: Self-pay | Admitting: Internal Medicine

## 2023-08-21 ENCOUNTER — Other Ambulatory Visit: Payer: Self-pay

## 2023-08-21 ENCOUNTER — Ambulatory Visit: Payer: 59 | Admitting: Internal Medicine

## 2023-08-21 ENCOUNTER — Emergency Department (HOSPITAL_COMMUNITY)
Admission: EM | Admit: 2023-08-21 | Discharge: 2023-08-21 | Disposition: A | Discharge status: Home / Self Care | Payer: 59 | Attending: Emergency Medicine | Admitting: Emergency Medicine

## 2023-08-21 VITALS — BP 116/76 | HR 79 | Temp 97.7°F | Resp 16 | Ht <= 58 in

## 2023-08-21 DIAGNOSIS — H16311 Corneal abscess, right eye: Secondary | ICD-10-CM | POA: Insufficient documentation

## 2023-08-21 DIAGNOSIS — D696 Thrombocytopenia, unspecified: Secondary | ICD-10-CM | POA: Diagnosis not present

## 2023-08-21 DIAGNOSIS — L0291 Cutaneous abscess, unspecified: Secondary | ICD-10-CM

## 2023-08-21 DIAGNOSIS — L0201 Cutaneous abscess of face: Secondary | ICD-10-CM | POA: Insufficient documentation

## 2023-08-21 DIAGNOSIS — Z23 Encounter for immunization: Secondary | ICD-10-CM

## 2023-08-21 MED ORDER — DOXYCYCLINE MONOHYDRATE 25 MG/5ML PO SUSR: 70.0000 mg | Freq: Two times a day (BID) | ORAL | 0 refills | Status: AC

## 2023-08-21 NOTE — Discharge Instructions (Signed)
Continue with warm compresses to face. Antibiotics as prescribed.  Recheck if not improving in the next 2-3 days or if worse at any time.

## 2023-08-21 NOTE — ED Triage Notes (Signed)
Patient's caregiver reports abscess under left eye for 1.5 weeks.

## 2023-08-21 NOTE — ED Provider Notes (Signed)
[place] EMERGENCY DEPARTMENT AT [facility] Provider Note   CSN: [id number] Arrival date & time: 08/21/23  1447     History  Chief Complaint  Patient presents with   Abscess    Theodore Lewis is a 27 y.o. male.  27 year old male presents with mom with concern for area under the right eye x 1 week. Patient is non-verbal, mom notes 1.5 weeks of area on face. No fevers, no changes in behavior, eating well.        Home Medications Prior to Admission medications   Medication Sig Start Date End Date Taking? Authorizing Provider  doxycycline (VIBRAMYCIN) 25 MG/5ML SUSR Take 14 mLs (70 mg total) by mouth 2 (two) times daily for 10 days. 08/21/23 08/31/23 Yes Jeannie Fend, PA-C  carbamazepine (CARBATROL) 300 MG 12 hr capsule Take 1 capsule (300 mg total) by mouth 2 (two) times daily. 08/23/22   Elveria Rising, NP  cetirizine HCl (ZYRTEC) 5 MG/5ML SOLN Take 10 mLs (10 mg total) by mouth daily. 08/02/21   Etta Grandchild, MD  DEPAKOTE SPRINKLES 125 MG capsule TAKE 5 CAPSULE BY MOUTH TWICE DAILY 06/18/23   Elveria Rising, NP  diazepam (DIASTAT ACUDIAL) 10 MG GEL INSERT 10MG  RECTALLY AT ONSET OF SEIZURE 01/20/23   Elveria Rising, NP  diazepam (VALIUM) 1 MG/ML solution Give 1 ml at bedtime and give 1ml by mouth up to twice per day as needed for restlessness Patient not taking: Reported on 04/08/2023 04/13/21   Elveria Rising, NP  famotidine (PEPCID) 40 MG/5ML suspension SHAKE LIQUID AND TAKE 5 ML(40 MG) BY MOUTH TWICE DAILY 05/02/23   Zehr, Princella Pellegrini, PA-C  hydrOXYzine (ATARAX) 10 MG tablet Crush and give 1 tablet at bedtime 07/04/23   Elveria Rising, NP  ibuprofen (ADVIL) 100 MG/5ML suspension Take by mouth. 05/23/17   [provider]  Nutritional Supplements (DUOCAL) POWD Give 1 scoop into food or liquid 4 times per day 06/06/23   Elveria Rising, NP  Nutritional Supplements (NUTRITIONAL SUPPLEMENT PLUS) LIQD 1 Ensure Clear or Boost Breeze given PO  daily. Patient not taking: Reported on 06/06/2023 08/08/22   Elveria Rising, NP  nystatin-triamcinolone ointment [facility]) Apply 1 Application topically 2 (two) times daily. 02/21/23   Smoot, Sarah A, PA-C  ondansetron (ZOFRAN) 4 MG/5ML solution Take 5 mLs (4 mg total) by mouth every 8 (eight) hours as needed for nausea or vomiting. Patient not taking: Reported on 06/06/2023 04/04/23   Roxy Horseman, PA-C      Allergies    Penicillins and Vancomycin    Review of Systems   Review of Systems Negative except as per HPI Physical Exam Updated Vital Signs BP 92/82 (BP Location: Left Arm)   Pulse 83   Temp 97.9 F (36.6 C) (Oral)   Resp 16   Wt 31.8 kg   SpO2 98%   BMI 21.36 kg/m  Physical Exam Vitals and nursing note reviewed.  Constitutional:      General: He is not in acute distress.    Appearance: He is well-developed. He is not diaphoretic.  HENT:     Head: Normocephalic and atraumatic.     Mouth/Throat:     Mouth: Mucous membranes are moist.  Pulmonary:     Effort: Pulmonary effort is normal.  Musculoskeletal:     Cervical back: Neck supple.  Lymphadenopathy:     Head:     Right side of head: Occipital adenopathy present.  Skin:    General: Skin is warm  and dry.     Findings: Erythema present.  Neurological:     Mental Status: He is alert.  Psychiatric:        Behavior: Behavior normal.     ED Results / Procedures / Treatments   Labs (all labs ordered are listed, but only abnormal results are displayed) Labs Reviewed - No data to display  EKG None  Radiology No results found.  Procedures Procedures    Medications Ordered in ED Medications - No data to display  ED Course/ Medical Decision Making/ A&P                                 Medical Decision Making Risk Prescription drug management.   27 year old male brought in by parents with concern for an erythematous area to the right cheek as above. Seen by PCP today who deferred to ER evaluation.  Area appears erythematous without fluctuance or drainage.  Discussed cosmetically sensitive area with parents, patient does not allow for easy exam of the area.  Recommend warm compresses and antibiotics with close follow-up.  Discussed possible need for I&D in the future.  Parents verbalized understanding of plan of care and agree.        Final Clinical Impression(s) / ED Diagnoses Final diagnoses:  Abscess    Rx / DC Orders ED Discharge Orders          Ordered    doxycycline (VIBRAMYCIN) 25 MG/5ML SUSR  2 times daily        08/21/23 1606              Jeannie Fend, PA-C 08/25/23 2046    Anders Simmonds T, DO 08/26/23 1641

## 2023-08-21 NOTE — Progress Notes (Signed)
Subjective:  Patient ID: Theodore Lewis, male    DOB: 1996/03/12  Age: 27 y.o. MRN: [id number]  CC: Abscess   HPI LIN GLAZIER presents for f/up ----  Discussed the use of AI scribe software for clinical note transcription with the patient, who gave verbal consent to proceed.  History of Present Illness   The patient presented with a chief complaint of red, swollen area over the right cheek spot, which has been present for approximately a week. The patient has not been touching or pulling at the spot, and it has not been draining. Despite the presence of the spot, the patient has not experienced any fever or chills. His mother has been applying a hot towel to the area, but it has not resulted in any noticeable improvement. The patient took pain medication for the first time the previous night in an attempt to alleviate any discomfort.       Outpatient Medications Prior to Visit  Medication Sig Dispense Refill   carbamazepine (CARBATROL) 300 MG 12 hr capsule Take 1 capsule (300 mg total) by mouth 2 (two) times daily. 180 capsule 3   cetirizine HCl (ZYRTEC) 5 MG/5ML SOLN Take 10 mLs (10 mg total) by mouth daily. 473 mL 1   DEPAKOTE SPRINKLES 125 MG capsule TAKE 5 CAPSULE BY MOUTH TWICE DAILY 310 capsule 5   diazepam (DIASTAT ACUDIAL) 10 MG GEL INSERT 10MG  RECTALLY AT ONSET OF SEIZURE 3 each 3   famotidine (PEPCID) 40 MG/5ML suspension SHAKE LIQUID AND TAKE 5 ML(40 MG) BY MOUTH TWICE DAILY 300 mL 2   hydrOXYzine (ATARAX) 10 MG tablet Crush and give 1 tablet at bedtime 30 tablet 0   ibuprofen (ADVIL) 100 MG/5ML suspension Take by mouth.     Nutritional Supplements (DUOCAL) POWD Give 1 scoop into food or liquid 4 times per day     nystatin-triamcinolone ointment (MYCOLOG) Apply 1 Application topically 2 (two) times daily. 30 g 0   diazepam (VALIUM) 1 MG/ML solution Give 1 ml at bedtime and give 1ml by mouth up to twice per day as needed for restlessness (Patient not taking: Reported on  04/08/2023) 90 mL 5   Nutritional Supplements (NUTRITIONAL SUPPLEMENT PLUS) LIQD 1 Ensure Clear or Boost Breeze given PO daily. (Patient not taking: Reported on 06/06/2023) 7347 mL 12   ondansetron (ZOFRAN) 4 MG/5ML solution Take 5 mLs (4 mg total) by mouth every 8 (eight) hours as needed for nausea or vomiting. (Patient not taking: Reported on 06/06/2023) 50 mL 0   No facility-administered medications prior to visit.    ROS Review of Systems  All other systems reviewed and are negative.   Objective:  BP 116/76   Pulse 79   Temp 97.7 F (36.5 C) (Oral)   Resp 16   Ht 4' (1.219 m)   SpO2 100%   BMI 20.93 kg/m   BP Readings from Last 3 Encounters:  08/21/23 92/82  08/21/23 116/76  04/22/23 110/78    Wt Readings from Last 3 Encounters:  08/21/23 70 lb (31.8 kg)  07/04/23 68 lb 9.6 oz (31.1 kg)  06/06/23 71 lb (32.2 kg)    Physical Exam Constitutional:      General: He is not in acute distress.    Appearance: He is not toxic-appearing or diaphoretic.  Eyes:     General: No scleral icterus. Cardiovascular:     Rate and Rhythm: Normal rate and regular rhythm.     Heart sounds: No murmur heard. Pulmonary:  Effort: Pulmonary effort is normal.     Breath sounds: No stridor. No wheezing, rhonchi or rales.  Abdominal:     General: Abdomen is flat.     Palpations: There is no mass.     Tenderness: There is no abdominal tenderness. There is no guarding.     Hernia: No hernia is present.  Musculoskeletal:        General: Normal range of motion.     Cervical back: Neck supple.  Skin:    General: Skin is warm and dry.     Findings: Erythema present.     Comments: He would not allow me to palpate the area.  Neurological:     General: No focal deficit present.     Lab Results  Component Value Date   WBC 4.0 04/04/2023   HGB 15.5 04/04/2023   HCT 44.0 04/04/2023   PLT 87 (L) 04/04/2023   GLUCOSE 86 04/04/2023   ALT 23 04/04/2023   AST 22 04/04/2023   NA 137  04/04/2023   K 3.9 04/04/2023   CL 100 04/04/2023   CREATININE 0.60 (L) 04/04/2023   BUN 13 04/04/2023   CO2 28 04/04/2023   TSH 3.16 11/21/2022    No results found.  Assessment & Plan:  Flu vaccine need -     Flu vaccine trivalent PF, 6mos and older(Flulaval,Afluria,Fluarix,Fluzone)  Cutaneous abscess of face- This needs I and D. I think it will require anesthesia since he would not allow me to palpate the area. He was referred to the ED.  Thrombocytopenia (HCC)- I will to have platelets check.     Follow-up: No follow-ups on file.  Sanda Linger, MD

## 2023-08-25 NOTE — Plan of Care (Signed)
[facility] Tonsillectomy/Adenoidectomy, Postoperative PEDS care plan entered in error.

## 2023-09-06 ENCOUNTER — Other Ambulatory Visit (INDEPENDENT_AMBULATORY_CARE_PROVIDER_SITE_OTHER): Payer: Self-pay | Admitting: Family

## 2023-09-06 DIAGNOSIS — G40309 Generalized idiopathic epilepsy and epileptic syndromes, not intractable, without status epilepticus: Secondary | ICD-10-CM

## 2023-09-06 DIAGNOSIS — G40209 Localization-related (focal) (partial) symptomatic epilepsy and epileptic syndromes with complex partial seizures, not intractable, without status epilepticus: Secondary | ICD-10-CM

## 2023-09-08 NOTE — Telephone Encounter (Signed)
Last OV 06/2023  Next OV 09/11/2023 Request for refill of Diastat- RN routed to [facility] NP to fill at OV or consider changing to nasal medication.

## 2023-09-09 ENCOUNTER — Telehealth: Payer: Self-pay | Admitting: Internal Medicine

## 2023-09-09 NOTE — Telephone Encounter (Signed)
Will you see this patient Friday or do you want me to push it out ?

## 2023-09-09 NOTE — Telephone Encounter (Signed)
Patient's mother [name] called and said patient was seen at the ED after seeing Dr. Yetta Barre on 08/21/2023. She said she has some concerns about the spot on his face. Patient was prescribed doxycycline at the ED, which temporarily helped. [name] said it was getting better and seems to be getting worse again. She would like to know what to do. [name] would like a call back at [phone].

## 2023-09-09 NOTE — Telephone Encounter (Signed)
Patient has been scheduled

## 2023-09-11 ENCOUNTER — Encounter (INDEPENDENT_AMBULATORY_CARE_PROVIDER_SITE_OTHER): Payer: Self-pay | Admitting: Family

## 2023-09-11 ENCOUNTER — Ambulatory Visit (INDEPENDENT_AMBULATORY_CARE_PROVIDER_SITE_OTHER): Payer: 59 | Admitting: Family

## 2023-09-11 VITALS — Wt 73.2 lb

## 2023-09-11 DIAGNOSIS — G40209 Localization-related (focal) (partial) symptomatic epilepsy and epileptic syndromes with complex partial seizures, not intractable, without status epilepticus: Secondary | ICD-10-CM

## 2023-09-11 DIAGNOSIS — F71 Moderate intellectual disabilities: Secondary | ICD-10-CM

## 2023-09-11 DIAGNOSIS — G40309 Generalized idiopathic epilepsy and epileptic syndromes, not intractable, without status epilepticus: Secondary | ICD-10-CM | POA: Diagnosis not present

## 2023-09-11 DIAGNOSIS — R4689 Other symptoms and signs involving appearance and behavior: Secondary | ICD-10-CM

## 2023-09-11 DIAGNOSIS — G808 Other cerebral palsy: Secondary | ICD-10-CM | POA: Diagnosis not present

## 2023-09-11 DIAGNOSIS — R451 Restlessness and agitation: Secondary | ICD-10-CM

## 2023-09-11 DIAGNOSIS — R6339 Other feeding difficulties: Secondary | ICD-10-CM | POA: Diagnosis not present

## 2023-09-11 DIAGNOSIS — E46 Unspecified protein-calorie malnutrition: Secondary | ICD-10-CM

## 2023-09-11 DIAGNOSIS — R636 Underweight: Secondary | ICD-10-CM

## 2023-09-11 NOTE — Progress Notes (Signed)
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 This was treated with VP shunt. He has resultant spastic quadriparesis, contractures, poor vision, evidence of an axial collection of mixed type in the right frontal-temporal-parietal region and possible infarction of the right brain with encephalomalacia of the left brain, as well as intermittent episode of agitation. He is taking and tolerating Depakote and Carbatrol for his seizure disorder. He is cared for at home by his parents, but his father has significant health problems that is limiting his ability to provide physical care    Due to his medical condition, he is indefinitely incontinent of stool and urine.  It is medically necessary for him to use diapers, underpads, and gloves to assist with hygiene and skin integrity.    Today's concerns: He has gained 3 lbs since his last visit. Parents report that he is eating more than in the past Mom notes that since he has been eating more than his stools are more formed and that sometimes he has difficulty passing the stool.  Mom notes that if Theodore Lewis is aware of the Duocal in his food that he will refuse to eat Has remained seizure free since his last visit.  Theodore Lewis has been otherwise generally healthy since he was last seen. No health concerns today other than previously mentioned.  Review of systems: Please see HPI for neurologic and other pertinent review of systems. Otherwise all other systems were reviewed and were  negative.  Problem List: Patient Active Problem List   Diagnosis Date Noted   Flu vaccine need 08/21/2023   Cutaneous abscess of face 08/21/2023   Oral aversion 07/04/2023   Insomnia 07/04/2023   COVID 05/13/2023   Abnormal barium swallow 11/14/2022   Increased nutritional needs 11/14/2022   Underweight 11/14/2022   Dysphagia 08/09/2022   Protein-calorie malnutrition (HCC) 08/09/2022   Picky eater 08/09/2022   Decreased motor strength 11/12/2021   Thrombocytopenia (HCC) 08/02/2021   Seasonal allergic rhinitis due to pollen 08/02/2021   Cachexia (HCC) 08/02/2021   Loss of weight 09/22/2020   Oppositional behavior 09/22/2020   Restlessness and agitation 04/20/2015   Moderate intellectual disabilities 02/24/2013   History of congenital brain abnormality 02/24/2013   Obstructive hydrocephalus (HCC) 02/24/2013   Congenital quadriplegia (HCC) 02/24/2013   Generalized convulsive epilepsy (HCC) 02/24/2013   Partial epilepsy with impairment of consciousness (HCC) 02/24/2013     Past Medical History:  Diagnosis Date   Anxiety    Cerebral palsy (HCC)    Congenital quadriparesis (HCC)    Neuromuscular disorder (HCC)    Seizure (HCC)    Seizures (HCC)    last seizure 3 yrs ago    Past medical history comments: See HPI Copied from previous record: He presented at few months of age with massive hydrocephalus.  He required urgent placement of a ventriculoperitoneal shunt.  He has experienced revisions in the past.  Brain images in 2007 showed an extra-axial collection of mixed type in the right frontotemporal parietal region with possible infarction in the right brain and significant encephalomalacia of  the left brain.  Ventricles were well decompressed.   A CT scan of the brain and shunt series Mar 04, 2013 showed microcephaly with marked diffuse thickening of the calvarium and marked enlargement of the mastoid sinus bilaterally. Left frontal shunt catheter extends to the region of the  third ventricle and is unchanged. No hydrocephalus. There is cerebellar atrophy bilaterally. 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 Brainstem is small. There is atrophy of the left occipital lobe. Agenesis of the corpus callosum.   Large extra-axial fluid collection on the right measures 4.6 x 11.1 cm. This has low density centrally and the wall has calcified since the prior study. The fluid collection has matured and become better defined since the prior study. There is some mass effect on the right cerebral hemisphere. No midline shift to the left. No acute hemorrhage. Shunt tubing was intact from the ventricles to the abdomen.  Surgical history: Past Surgical History:  Procedure Laterality Date   BRAIN SURGERY     Shunt placed when he was a yr. old   I & D EXTREMITY Bilateral 04/29/2014   Procedure: IRRIGATION AND DEBRIDEMENT EXTREMITY;  Surgeon: Eldred Manges, MD;  Location: [facility] OR;  Service: Orthopedics;  Laterality: Bilateral;  Right and Left Hamstring Lenghening and Fiberglass Casting   LEG TENDON SURGERY Bilateral approx 6 yrs ago     Family history: family history includes Cancer in his paternal grandfather and paternal grandmother; Diabetes in his father and mother; Early death in his paternal grandfather and paternal grandmother; Healthy in his mother; Heart disease in his father, maternal grandfather, and mother; Hyperlipidemia in his father, maternal grandfather, and mother; Hypertension in his father, maternal grandfather, and maternal grandmother; Intellectual disability in his maternal grandfather; Learning disabilities in his father; Stomach cancer in his paternal grandfather and paternal grandmother; Throat cancer in his paternal grandfather.   Social history: Social History   Socioeconomic History   Marital status: Single    Spouse name: Not on file   Number of children: 0   Years of education: Not on file   Highest education level: Not on  file  Occupational History   Occupation: disbale  Tobacco Use   Smoking status: Never    Passive exposure: Never   Smokeless tobacco: Never  Vaping Use   Vaping status: Not on file  Substance and Sexual Activity   Alcohol use: No   Drug use: No   Sexual activity: Never  Other Topics Concern   Not on file  Social History Narrative   Theodore Lewis live at home with mom and dad.   He enjoys going outside, and riding.    Social Determinants of Health   Financial Resource Strain: Low Risk  (06/17/2023)   Overall Financial Resource Strain (CARDIA)    Difficulty of Paying Living Expenses: Not hard at all  Food Insecurity: No Food Insecurity (06/17/2023)   Hunger Vital Sign    Worried About Running Out of Food in the Last Year: Never true    Ran Out of Food in the Last Year: Never true  Transportation Needs: No Transportation Needs (06/17/2023)   PRAPARE - Administrator, Civil Service (Medical): No    Lack of Transportation (Non-Medical): No  Physical Activity: Inactive (06/17/2023)   Exercise Vital Sign    Days of Exercise per Week: 0 days    Minutes of Exercise per Session: 0 min  Stress: Patient Unable To Answer (06/17/2023)   [employer]  of Occupational Health - Occupational Stress Questionnaire    Feeling of Stress : Patient unable to answer  Social Connections: Patient Unable To Answer (06/17/2023)   Social Connection and Isolation Panel [NHANES]    Frequency of Communication with Friends and Family: Patient unable to answer    Frequency of Social Gatherings with Friends and Family: Patient unable to answer    Attends Religious Services: Patient unable to answer    Active Member of Clubs or Organizations: Patient unable to answer    Attends Banker Meetings: Patient unable to answer    Marital Status: Patient unable to answer  Intimate Partner Violence: Not At Risk (06/17/2023)   Humiliation, Afraid, Rape, and Kick questionnaire    Fear of Current or  Ex-Partner: No    Emotionally Abused: No    Physically Abused: No    Sexually Abused: No    Past/failed meds:  Allergies: Allergies  Allergen Reactions   Penicillins Other (See Comments)    Right side of body started shaking     Vancomycin Rash    Immunizations: Immunization History  Administered Date(s) Administered   Influenza Inj Mdck Quad With Preservative 07/17/2022   Influenza-Unspecified 07/24/2021   PFIZER(Purple Top)SARS-COV-2 Vaccination 03/13/2020, 04/04/2020, 10/05/2020   Pfizer(Comirnaty)Fall Seasonal Vaccine 12 years and older 08/14/2022    Diagnostics/Screenings: Copied from previous record: 08/22/2022 - Barium Swallow - Limited study as pt was apprehensive to swallow the barium *despite it being sweetened with artificial sweetener* and several options provided.  In addition, suboptimal view due to pt leaning forward out of flouro view. Patient's mother had to help feed Theodore Lewis - as he would only accept *reluctantly* intake from her.  Mild oral dysphagia most c/b impaired oral control resulting in premature spillage of boluses into pharynx and lack of adequate mastication.  Pt did not adequate masticate with sweet potato despite mom rubbing his chin in effort to induce mastication. Minimal vertical mastication observed with pt and prolonged lingual press to palate to transit bolus into pharynx it- consistent with intellectual disability. Pudding bolus rapidly spilled (dumped) into pharynx with swallow triggering at vallecular space.  SLP had pt use his bottle/straw during the test - with him accepting only small sips but adequate airway protection.  Premature spillage to pyriform sinus with thin observed prior to swallow trigger.     Pharyngeal swallow was strong without any retention fortunately. Sa did not cough or gag during po trials of MBS - but he only accepted approx. 7 boluses total.       Upon esophageal sweep, pt noted to be kyphotic - which raises concerns  for potential multifactorial dysphagia including esophageal issues or GI issues contributing to gagging.  Please see video loops of esophageal sweep.       03/12/2021 - CT head wo contrast - 1. Outside of the chest, a second new discontinuity of the shunt tubing is identified overlying the left mastoid. 2. But there is no ventriculomegaly. And the non contrast CT appearance of the brain is stable since 2018.   05/23/2017 - CT head wo contrast - No acute finding. Chronic microcephaly and brain atrophy. Chronic calcified subdural collection on the right, not significantly changed since 03/04/2013. No evidence of shunt malfunction. Sinuses clear  Physical Exam: Wt 73 lb 3.2 oz (33.2 kg)   BMI 22.34 kg/m   Wt Readings from Last 3 Encounters:  09/11/23 73 lb 3.2 oz (33.2 kg)  08/21/23 70 lb (31.8 kg)  07/04/23 68 lb 9.6  oz (31.1 kg)  General: thin but well developed, well nourished young man, seated in wheelchair, in no evident distress Head: normocephalic and atraumatic. Oropharynx difficult to examine but appears benign. No dysmorphic features. Neck: supple Cardiovascular: regular rate and rhythm, no murmurs. Respiratory: clear to auscultation bilaterally Abdomen: bowel sounds present all four quadrants, abdomen soft, non-tender, non-distended.  Musculoskeletal: no skeletal deformities or obvious scoliosis. Has muscle wasting and flexion contractures Skin: no rashes or neurocutaneous lesions  Neurologic Exam Mental Status: awake and fully alert. Has no language but is making vocalizations today. Holds a binder clip in his hand and gets upset if it is removed. Resistant to invasions into his space. Unable to follow instructions or participate in examination Cranial Nerves: fundoscopic exam - red reflex present.  Unable to fully visualize fundus.  Pupils equal briskly reactive to light.  Turns to localize faces and objects in the periphery. Turns to localize sounds in the periphery. Facial  movements are symmetric Motor: spastic quadriparesis  Sensory: withdrawal x 4 Coordination: unable to adequately assess due to patient's inability to participate in examination. No dysmetria with reach for objects. Gait and Station: unable to stand and bear weight.   Impression: Underweight  Moderate intellectual disabilities  Congenital quadriplegia (HCC)  Generalized convulsive epilepsy (HCC)  Partial epilepsy with impairment of consciousness (HCC)  Protein-calorie malnutrition, unspecified severity (HCC)  Picky eater  Restlessness and agitation  Oppositional behavior  Oral aversion   Recommendations for plan of care: The patient's previous Epic records were reviewed. No recent diagnostic studies to be reviewed with the patient.  Plan until next visit: Continue working on getting Theodore Lewis to consume foods.  Continue Duocal when you can get him to take it.  Continue medications as prescribed  Call for questions or concerns Return in about 5 months (around 02/09/2024).  The medication list was reviewed and reconciled. No changes were made in the prescribed medications today. A complete medication list was provided to the patient.  Allergies as of 09/11/2023       Reactions   Penicillins Other (See Comments)   Right side of body started shaking    Vancomycin Rash        Medication List        Accurate as of September 11, 2023  8:59 AM. If you have any questions, ask your nurse or doctor.          carbamazepine 300 MG 12 hr capsule Commonly known as: Carbatrol Take 1 capsule (300 mg total) by mouth 2 (two) times daily.   cetirizine HCl 5 MG/5ML Soln Commonly known as: Zyrtec Take 10 mLs (10 mg total) by mouth daily.   Depakote Sprinkles 125 MG capsule Generic drug: divalproex TAKE 5 CAPSULE BY MOUTH TWICE DAILY   diazepam 1 MG/ML solution Commonly known as: VALIUM Give 1 ml at bedtime and give 1ml by mouth up to twice per day as needed for restlessness    diazepam 10 MG Gel Commonly known as: DIASTAT ACUDIAL INSERT 10 MG RECTALLY AT ONSET OF SEIZURE   famotidine 40 MG/5ML suspension Commonly known as: PEPCID SHAKE LIQUID AND TAKE 5 ML(40 MG) BY MOUTH TWICE DAILY   hydrOXYzine 10 MG tablet Commonly known as: ATARAX Crush and give 1 tablet at bedtime   ibuprofen 100 MG/5ML suspension Commonly known as: ADVIL Take by mouth.   Nutritional Supplement Plus Liqd 1 Ensure Clear or Boost Breeze given PO daily.   Duocal Powd Give 1 scoop into food or liquid 4  times per day   nystatin-triamcinolone ointment Commonly known as: MYCOLOG Apply 1 Application topically 2 (two) times daily.   ondansetron 4 MG/5ML solution Commonly known as: ZOFRAN Take 5 mLs (4 mg total) by mouth every 8 (eight) hours as needed for nausea or vomiting.      Total time spent with the patient was 25 minutes, of which 50% or more was spent in counseling and coordination of care.  Elveria Rising NP-C [facility] [address] [place], [place] [address] Ph. [phone] Fax [phone]

## 2023-09-12 ENCOUNTER — Ambulatory Visit (INDEPENDENT_AMBULATORY_CARE_PROVIDER_SITE_OTHER): Payer: 59 | Admitting: Internal Medicine

## 2023-09-12 ENCOUNTER — Encounter (INDEPENDENT_AMBULATORY_CARE_PROVIDER_SITE_OTHER): Payer: Self-pay | Admitting: Family

## 2023-09-12 ENCOUNTER — Encounter: Payer: Self-pay | Admitting: Internal Medicine

## 2023-09-12 VITALS — BP 121/85 | HR 82 | Temp 98.0°F | Resp 16 | Ht <= 58 in | Wt 73.0 lb

## 2023-09-12 DIAGNOSIS — B356 Tinea cruris: Secondary | ICD-10-CM | POA: Diagnosis not present

## 2023-09-12 DIAGNOSIS — L0201 Cutaneous abscess of face: Secondary | ICD-10-CM

## 2023-09-12 DIAGNOSIS — K5904 Chronic idiopathic constipation: Secondary | ICD-10-CM | POA: Diagnosis not present

## 2023-09-12 DIAGNOSIS — K5909 Other constipation: Secondary | ICD-10-CM | POA: Insufficient documentation

## 2023-09-12 MED ORDER — KETOCONAZOLE 2 % EX CREA: 1.0000 | TOPICAL_CREAM | Freq: Two times a day (BID) | CUTANEOUS | 2 refills | Status: AC | PRN

## 2023-09-12 MED ORDER — DOXYCYCLINE MONOHYDRATE 25 MG/5ML PO SUSR: 75.0000 mg | Freq: Two times a day (BID) | ORAL | 0 refills | Status: AC

## 2023-09-12 MED ORDER — LINACLOTIDE 72 MCG PO CAPS: 72.0000 ug | ORAL_CAPSULE | Freq: Every day | ORAL | 0 refills | Status: DC

## 2023-09-12 NOTE — Patient Instructions (Signed)
Skin Abscess  A skin abscess is an infected area on or under your skin. It contains pus and other material. An abscess may also be called a furuncle, carbuncle, or boil. It is often the result of an infection caused by bacteria. An abscess can occur in or on almost any part of your body. Sometimes, an abscess may break open (rupture) on its own. In most cases, it will keep getting worse unless it is treated. An abscess can cause pain and make you feel ill. An untreated abscess can cause infection to spread to other parts of your body or your bloodstream. The abscess may need to be drained. You may also need to take antibiotics. What are the causes? An abscess occurs when germs, like bacteria, pass through your skin and cause an infection. This may be caused by: A scrape or cut on your skin. A puncture wound through your skin, such as a needle injection or insect bite. Blocked oil or sweat glands. Blocked and infected hair follicles. A fluid-filled sac that forms beneath your skin (sebaceous cyst) and becomes infected. What increases the risk? You may be more likely to develop an abscess if: You have problems with blood circulation, or you have a weak body defense system (immune system). You have diabetes. You have dry and irritated skin. You get injections often or use IV drugs. You have a foreign body in a wound, such as a splinter. You smoke or use tobacco products. What are the signs or symptoms? Symptoms of this condition include: A painful, firm bump under the skin. A bump with pus at the top. This may break through the skin and drain. Other symptoms include: Redness and swelling around the abscess. Warmth or tenderness. Swelling of the lymph nodes (glands) near the abscess. A sore on the skin. How is this diagnosed? This condition may be diagnosed based on a physical exam and your medical history. You may also have tests done, such as: A test of a sample of pus. This may be done  to find what is causing the infection. Blood tests. Imaging tests, such as an ultrasound, CT scan, or MRI. How is this treated? A small abscess that drains on its own may not need to be treated. Treatment for larger abscesses may include: Moist heat or a heat pack applied to the area a few times a day. Incision and drainage. This is a procedure to drain the abscess. Antibiotics. For a severe abscess, you may first get antibiotics through an IV and then change to antibiotics by mouth. Follow these instructions at home: Medicines Take over-the-counter and prescription medicines only as told by your provider. If you were prescribed antibiotics, take them as told by your provider. Do not stop using the antibiotic even if you start to feel better. Abscess care  If you have an abscess that has not drained, apply heat to the affected area. Use the heat source that your provider recommends, such as a moist heat pack or a heating pad. Place a towel between your skin and the heat source. Leave the heat on for 20-30 minutes at a time. If your skin turns bright red, remove the heat right away to prevent burns. The risk of burns is higher if you cannot feel pain, heat, or cold. Follow instructions from your provider about how to take care of your abscess. Make sure you: Cover the abscess with a bandage (dressing). Wash your hands with soap and water for at least 20 seconds before  and after you change the dressing or gauze. If soap and water are not available, use hand sanitizer. Change your dressing or gauze as told by your provider. Check your abscess every day for signs of an infection that is getting worse. Check for: More redness, swelling, pain, or tenderness. More fluid or blood. Warmth. More pus or a worse smell. General instructions To avoid spreading the infection: Do not share personal care items, towels, or hot tubs with others. Avoid making skin contact with other people. Be careful  when getting rid of used dressings, wound packing, or any drainage from the abscess. Do not use any products that contain nicotine or tobacco. These products include cigarettes, chewing tobacco, and vaping devices, such as e-cigarettes. If you need help quitting, ask your provider. Do not use any creams, ointments, or liquids unless you have been told to by your provider. Contact a health care provider if: You see redness that spreads quickly or red streaks on your skin spreading away from the abscess. You have any signs of worse infection at the abscess. You vomit every time you eat or drink. You have a fever, chills, or muscle aches. The cyst or abscess returns. Get help right away if: You have severe pain. You make less pee (urine) than normal. This information is not intended to replace advice given to you by your health care provider. Make sure you discuss any questions you have with your health care provider. Document Revised: 06/05/2022 Document Reviewed: 06/05/2022 Elsevier Patient Education  2024 [employer].

## 2023-09-12 NOTE — Progress Notes (Signed)
Subjective:  Patient ID: Theodore Lewis, male    DOB: Feb 20, 1996  Age: 27 y.o. MRN: [id number]  CC: Follow-up   HPI Theodore Lewis presents for f/up ---    His mother requests a cream for jock itch.  She applies it intermittently.  She tells me that the abscess on the right side of the face got better after the prior course of antibiotics but now it is returning.  She has not noticed fever, nausea, or vomiting.  She has noticed constipation and straining for 2 weeks.  Outpatient Medications Prior to Visit  Medication Sig Dispense Refill   carbamazepine (CARBATROL) 300 MG 12 hr capsule Take 1 capsule (300 mg total) by mouth 2 (two) times daily. 180 capsule 3   cetirizine HCl (ZYRTEC) 5 MG/5ML SOLN Take 10 mLs (10 mg total) by mouth daily. 473 mL 1   DEPAKOTE SPRINKLES 125 MG capsule TAKE 5 CAPSULE BY MOUTH TWICE DAILY 310 capsule 5   diazepam (DIASTAT ACUDIAL) 10 MG GEL INSERT 10 MG RECTALLY AT ONSET OF SEIZURE 3 each 0   famotidine (PEPCID) 40 MG/5ML suspension SHAKE LIQUID AND TAKE 5 ML(40 MG) BY MOUTH TWICE DAILY (Patient taking differently: as needed.) 300 mL 2   ibuprofen (ADVIL) 100 MG/5ML suspension Take by mouth.     Nutritional Supplements (DUOCAL) POWD Give 1 scoop into food or liquid 4 times per day     Nutritional Supplements (NUTRITIONAL SUPPLEMENT PLUS) LIQD 1 Ensure Clear or Boost Breeze given PO daily. 7347 mL 12   ondansetron (ZOFRAN) 4 MG/5ML solution Take 5 mLs (4 mg total) by mouth every 8 (eight) hours as needed for nausea or vomiting. 50 mL 0   nystatin-triamcinolone ointment (MYCOLOG) Apply 1 Application topically 2 (two) times daily. 30 g 0   diazepam (VALIUM) 1 MG/ML solution Give 1 ml at bedtime and give 1ml by mouth up to twice per day as needed for restlessness (Patient not taking: Reported on 04/08/2023) 90 mL 5   hydrOXYzine (ATARAX) 10 MG tablet Crush and give 1 tablet at bedtime (Patient not taking: Reported on 09/12/2023) 30 tablet 0   No  facility-administered medications prior to visit.    ROS Review of Systems  Constitutional:  Negative for chills, diaphoresis, fever and unexpected weight change.  HENT: Negative.    Respiratory:  Negative for cough, chest tightness, shortness of breath and wheezing.   Cardiovascular:  Negative for chest pain, palpitations and leg swelling.  Gastrointestinal:  Positive for constipation. Negative for abdominal pain, blood in stool, nausea and vomiting.  Genitourinary:  Negative for difficulty urinating.  Musculoskeletal:  Positive for gait problem.  Skin: Negative.   Neurological:  Positive for weakness. Negative for dizziness.  Hematological:  Negative for adenopathy. Does not bruise/bleed easily.  Psychiatric/Behavioral: Negative.      Objective:  BP 121/85 (BP Location: Left Arm, Patient Position: Sitting, Cuff Size: Normal)   Pulse 82   Temp 98 F (36.7 C) (Oral)   Resp 16   Ht 4' (1.219 m)   Wt 73 lb (33.1 kg)   SpO2 97%   BMI 22.28 kg/m   BP Readings from Last 3 Encounters:  09/12/23 121/85  08/21/23 92/82  08/21/23 116/76    Wt Readings from Last 3 Encounters:  09/12/23 73 lb (33.1 kg)  09/11/23 73 lb 3.2 oz (33.2 kg)  08/21/23 70 lb (31.8 kg)    Physical Exam Vitals reviewed.  Constitutional:      General:  He is not in acute distress.    Appearance: He is not toxic-appearing or diaphoretic.  HENT:     Head:     Comments: Right malar surface.  There is a small area of induration.  There is no exudate, fluctuance, tenderness, or streaking.    Nose: Nose normal.     Mouth/Throat:     Mouth: Mucous membranes are moist.  Eyes:     General: No scleral icterus.    Conjunctiva/sclera: Conjunctivae normal.  Cardiovascular:     Rate and Rhythm: Normal rate and regular rhythm.     Heart sounds: No murmur heard. Pulmonary:     Effort: Pulmonary effort is normal.     Breath sounds: No stridor. No wheezing, rhonchi or rales.  Abdominal:     General: Abdomen is  scaphoid. Bowel sounds are decreased. There is no distension.     Palpations: There is no hepatomegaly, splenomegaly or mass.     Tenderness: There is no abdominal tenderness. There is no guarding.  Musculoskeletal:     Cervical back: Neck supple.  Lymphadenopathy:     Cervical: No cervical adenopathy.  Skin:    General: Skin is warm and dry.  Neurological:     Mental Status: He is alert. Mental status is at baseline.     Lab Results  Component Value Date   WBC 4.0 04/04/2023   HGB 15.5 04/04/2023   HCT 44.0 04/04/2023   PLT 87 (L) 04/04/2023   GLUCOSE 86 04/04/2023   ALT 23 04/04/2023   AST 22 04/04/2023   NA 137 04/04/2023   K 3.9 04/04/2023   CL 100 04/04/2023   CREATININE 0.60 (L) 04/04/2023   BUN 13 04/04/2023   CO2 28 04/04/2023   TSH 3.16 11/21/2022    No results found.  Assessment & Plan:   Cutaneous abscess of face -     Doxycycline Monohydrate; Take 15 mLs (75 mg total) by mouth 2 (two) times daily for 7 days.  Dispense: 240 mL; Refill: 0  Chronic idiopathic constipation -     linaCLOtide; Take 1 capsule (72 mcg total) by mouth daily before breakfast.  Dispense: 90 capsule; Refill: 0  Tinea cruris -     Ketoconazole; Apply 1 Application topically 2 (two) times daily as needed for irritation.  Dispense: 60 g; Refill: 2     Follow-up: Return in about 2 weeks (around 09/26/2023).  Sanda Linger, MD

## 2023-09-12 NOTE — Patient Instructions (Signed)
It was a pleasure to see you today!  Instructions for you until your next appointment are as follows: Continue working on getting Varian to eat. He has gained 3 lbs today and I would like to see him gain a few more pounds by his next visit Continue adding the Duocal to his food when you can Continue his medications as prescribed Call me for questions or concerns Please sign up for MyChart if you have not done so. Please plan to return for follow up in April 2025 or sooner if needed.  Feel free to contact our office during normal business hours at [phone] with questions or concerns. If there is no answer or the call is outside business hours, please leave a message and our clinic staff will call you back within the next business day.  If you have an urgent concern, please stay on the line for our after-hours answering service and ask for the on-call neurologist.     I also encourage you to use MyChart to communicate with me more directly. If you have not yet signed up for MyChart within [facility], the front desk staff can help you. However, please note that this inbox is NOT monitored on nights or weekends, and response can take up to 2 business days.  Urgent matters should be discussed with the on-call pediatric neurologist.   At [facility], we are committed to providing exceptional care. You will receive a patient satisfaction survey through text or email regarding your visit today. Your opinion is important to me. Comments are appreciated.

## 2023-10-18 ENCOUNTER — Other Ambulatory Visit (INDEPENDENT_AMBULATORY_CARE_PROVIDER_SITE_OTHER): Payer: Self-pay | Admitting: Family

## 2023-10-18 DIAGNOSIS — G40209 Localization-related (focal) (partial) symptomatic epilepsy and epileptic syndromes with complex partial seizures, not intractable, without status epilepticus: Secondary | ICD-10-CM

## 2023-10-18 DIAGNOSIS — G40309 Generalized idiopathic epilepsy and epileptic syndromes, not intractable, without status epilepticus: Secondary | ICD-10-CM

## 2023-10-20 ENCOUNTER — Other Ambulatory Visit: Payer: Self-pay | Admitting: Internal Medicine

## 2023-10-20 ENCOUNTER — Telehealth: Payer: Self-pay | Admitting: Internal Medicine

## 2023-10-20 ENCOUNTER — Other Ambulatory Visit (INDEPENDENT_AMBULATORY_CARE_PROVIDER_SITE_OTHER): Payer: Self-pay

## 2023-10-20 DIAGNOSIS — G40309 Generalized idiopathic epilepsy and epileptic syndromes, not intractable, without status epilepticus: Secondary | ICD-10-CM

## 2023-10-20 DIAGNOSIS — L0201 Cutaneous abscess of face: Secondary | ICD-10-CM

## 2023-10-20 DIAGNOSIS — G40209 Localization-related (focal) (partial) symptomatic epilepsy and epileptic syndromes with complex partial seizures, not intractable, without status epilepticus: Secondary | ICD-10-CM

## 2023-10-20 DIAGNOSIS — L989 Disorder of the skin and subcutaneous tissue, unspecified: Secondary | ICD-10-CM | POA: Insufficient documentation

## 2023-10-20 MED ORDER — CARBAMAZEPINE ER 300 MG PO CP12: 300.0000 mg | ORAL_CAPSULE | Freq: Two times a day (BID) | ORAL | 3 refills | Status: DC

## 2023-10-20 NOTE — Telephone Encounter (Signed)
Patients mother has been made aware. Gave a verbal understanding.

## 2023-10-20 NOTE — Telephone Encounter (Signed)
Patient's mother [name] called and said patient was seeing Dr. Yetta Barre about the spot on his face. Patient was prescribed doxycycline seems to be getting worse again. She would like to know what to do. [name] would like a call back at [phone].

## 2023-10-20 NOTE — Telephone Encounter (Signed)
Please advise 

## 2023-10-27 ENCOUNTER — Encounter: Payer: Self-pay | Admitting: Plastic Surgery

## 2023-10-27 ENCOUNTER — Ambulatory Visit: Payer: 59 | Admitting: Plastic Surgery

## 2023-10-27 DIAGNOSIS — L723 Sebaceous cyst: Secondary | ICD-10-CM | POA: Diagnosis not present

## 2023-10-27 NOTE — Progress Notes (Signed)
Patient ID: Theodore Lewis, male    DOB: [birth year], 27 y.o.   MRN: [id number]   Chief Complaint  Patient presents with   Skin Problem    The patient is a 27-year-old male here with family for evaluation of his right cheek.  He has hydrocephalus and quadriplegia with convulsive epilepsy and impairment of consciousness.  He seems to be very well taken care of and looked after by his family.  He has had a sebaceous cyst on his right cheek that flares up over the past couple of months.  He is needed a couple rounds of antibiotics.  At this point it is about 5 mm in size.  It is not inflamed and not tender.  It is palpable.  He has a planned procedure at [facility] in the next 6 months for dental cleaning.  Family is hoping he could have this removed at the same time.    Review of Systems  Constitutional: Negative.   Eyes: Negative.   Respiratory: Negative.    Cardiovascular: Negative.   Gastrointestinal: Negative.     Past Medical History:  Diagnosis Date   Anxiety    Cerebral palsy (HCC)    Congenital quadriparesis (HCC)    Neuromuscular disorder (HCC)    Seizure (HCC)    Seizures (HCC)    last seizure 3 yrs ago    Past Surgical History:  Procedure Laterality Date   BRAIN SURGERY     Shunt placed when he was a yr. old   I & D EXTREMITY Bilateral 04/29/2014   Procedure: IRRIGATION AND DEBRIDEMENT EXTREMITY;  Surgeon: Eldred Manges, MD;  Location: [facility] OR;  Service: Orthopedics;  Laterality: Bilateral;  Right and Left Hamstring Lenghening and Fiberglass Casting   LEG TENDON SURGERY Bilateral approx 6 yrs ago      Current Outpatient Medications:    carbamazepine (CARBATROL) 300 MG 12 hr capsule, Take 1 capsule (300 mg total) by mouth 2 (two) times daily., Disp: 180 capsule, Rfl: 3   cetirizine HCl (ZYRTEC) 5 MG/5ML SOLN, Take 10 mLs (10 mg total) by mouth daily., Disp: 473 mL, Rfl: 1   diazepam (DIASTAT ACUDIAL) 10 MG GEL, INSERT 10 MG RECTALLY AT ONSET OF SEIZURE, Disp: 3 each, Rfl:  0   diazepam (VALIUM) 1 MG/ML solution, Give 1 ml at bedtime and give 1ml by mouth up to twice per day as needed for restlessness (Patient not taking: Reported on 04/08/2023), Disp: 90 mL, Rfl: 5   divalproex (DEPAKOTE SPRINKLES) 125 MG capsule, TAKE 5 CAPSULE BY MOUTH TWICE DAILY, Disp: 310 capsule, Rfl: 3   famotidine (PEPCID) 40 MG/5ML suspension, SHAKE LIQUID AND TAKE 5 ML(40 MG) BY MOUTH TWICE DAILY (Patient taking differently: as needed.), Disp: 300 mL, Rfl: 2   hydrOXYzine (ATARAX) 10 MG tablet, Crush and give 1 tablet at bedtime (Patient not taking: Reported on 09/12/2023), Disp: 30 tablet, Rfl: 0   ibuprofen (ADVIL) 100 MG/5ML suspension, Take by mouth., Disp: , Rfl:    ketoconazole (NIZORAL) 2 % cream, Apply 1 Application topically 2 (two) times daily as needed for irritation., Disp: 60 g, Rfl: 2   linaclotide (LINZESS) 72 MCG capsule, Take 1 capsule (72 mcg total) by mouth daily before breakfast., Disp: 90 capsule, Rfl: 0   Nutritional Supplements (DUOCAL) POWD, Give 1 scoop into food or liquid 4 times per day, Disp: , Rfl:    Nutritional Supplements (NUTRITIONAL SUPPLEMENT PLUS) LIQD, 1 Ensure Clear or Boost Breeze given PO daily., Disp:  7347 mL, Rfl: 12   ondansetron (ZOFRAN) 4 MG/5ML solution, Take 5 mLs (4 mg total) by mouth every 8 (eight) hours as needed for nausea or vomiting., Disp: 50 mL, Rfl: 0   Objective:   There were no vitals filed for this visit.  Physical Exam Cardiovascular:     Rate and Rhythm: Normal rate.  Skin:    General: Skin is warm.  Neurological:     Mental Status: He is alert and oriented to person, place, and time.     Assessment & Plan:  Sebaceous cyst   It certainly possible that if this does not flareup it could be done at [facility] during his dental procedure.  I recommend working with ENT at [facility].  I also recommend cleaning his face with Vashe to see if we can decrease the bacterial count.  If it flares up again he will need another round of  antibiotics.  Doing the excision earlier is certainly possible here but would require anesthesia due to his restlessness.  Pictures were obtained of the patient and placed in the chart with the patient's or guardian's permission.  Alena Bills Anae Hams, DO

## 2023-10-28 ENCOUNTER — Encounter (HOSPITAL_COMMUNITY): Payer: Self-pay

## 2023-10-28 ENCOUNTER — Emergency Department (HOSPITAL_COMMUNITY)
Admission: EM | Admit: 2023-10-28 | Discharge: 2023-10-28 | Disposition: A | Discharge status: Home / Self Care | Payer: 59 | Attending: Student | Admitting: Student

## 2023-10-28 ENCOUNTER — Other Ambulatory Visit: Payer: Self-pay

## 2023-10-28 DIAGNOSIS — D696 Thrombocytopenia, unspecified: Secondary | ICD-10-CM | POA: Insufficient documentation

## 2023-10-28 DIAGNOSIS — R300 Dysuria: Secondary | ICD-10-CM | POA: Diagnosis present

## 2023-10-28 DIAGNOSIS — N3090 Cystitis, unspecified without hematuria: Secondary | ICD-10-CM | POA: Insufficient documentation

## 2023-10-28 LAB — COMPREHENSIVE METABOLIC PANEL
ALT: 28 U/L (ref 0–44)
AST: 22 U/L (ref 15–41)
Albumin: 4.6 g/dL (ref 3.5–5.0)
Alkaline Phosphatase: 76 U/L (ref 38–126)
Anion gap: 10 (ref 5–15)
BUN: 13 mg/dL (ref 6–20)
CO2: 27 mmol/L (ref 22–32)
Calcium: 9.7 mg/dL (ref 8.9–10.3)
Chloride: 101 mmol/L (ref 98–111)
Creatinine, Ser: 0.63 mg/dL (ref 0.61–1.24)
GFR, Estimated: >60 mL/min (ref >60)
Glucose, Bld: 89 mg/dL (ref 70–99)
Potassium: 3.5 mmol/L (ref 3.5–5.1)
Sodium: 138 mmol/L (ref 135–145)
Total Bilirubin: 0.9 mg/dL (ref <1.2)
Total Protein: 8.3 g/dL — ABNORMAL HIGH (ref 6.5–8.1)

## 2023-10-28 LAB — URINALYSIS, ROUTINE W REFLEX MICROSCOPIC
Bilirubin Urine: NEGATIVE
Glucose, UA: NEGATIVE mg/dL
Ketones, ur: 20 mg/dL — AB
Leukocytes,Ua: NEGATIVE
Nitrite: NEGATIVE
Protein, ur: 100 mg/dL — AB
RBC / HPF: >50 RBC/hpf (ref 0–5)
Specific Gravity, Urine: 1.031 — ABNORMAL HIGH (ref 1.005–1.030)
pH: 5 (ref 5.0–8.0)

## 2023-10-28 LAB — CBC WITH DIFFERENTIAL/PLATELET
Abs Immature Granulocytes: 0.02 10*3/uL (ref 0.00–0.07)
Basophils Absolute: 0 10*3/uL (ref 0.0–0.1)
Basophils Relative: 0 %
Eosinophils Absolute: 0 10*3/uL (ref 0.0–0.5)
Eosinophils Relative: 0 %
HCT: 47.8 % (ref 39.0–52.0)
Hemoglobin: 16.1 g/dL (ref 13.0–17.0)
Immature Granulocytes: 0 %
Lymphocytes Relative: 16 %
Lymphs Abs: 0.9 10*3/uL (ref 0.7–4.0)
MCH: 31.9 pg (ref 26.0–34.0)
MCHC: 33.7 g/dL (ref 30.0–36.0)
MCV: 94.8 fL (ref 80.0–100.0)
Monocytes Absolute: 0.7 10*3/uL (ref 0.1–1.0)
Monocytes Relative: 13 %
Neutro Abs: 4.1 10*3/uL (ref 1.7–7.7)
Neutrophils Relative %: 71 %
Platelets: 110 10*3/uL — ABNORMAL LOW (ref 150–400)
RBC: 5.04 MIL/uL (ref 4.22–5.81)
RDW: 13 % (ref 11.5–15.5)
WBC: 5.8 10*3/uL (ref 4.0–10.5)
nRBC: 0 % (ref 0.0–0.2)

## 2023-10-28 LAB — LIPASE, BLOOD: Lipase: 24 U/L (ref 11–51)

## 2023-10-28 LAB — CARBAMAZEPINE LEVEL, TOTAL: Carbamazepine Lvl: 3.7 ug/mL — ABNORMAL LOW (ref 4.0–12.0)

## 2023-10-28 MED ORDER — CEPHALEXIN 250 MG/5ML PO SUSR: 250.0000 mg | Freq: Four times a day (QID) | ORAL | 0 refills | Status: AC

## 2023-10-28 MED ORDER — MIDAZOLAM HCL 2 MG/ML PO SYRP
2.0000 mg | ORAL_SOLUTION | Freq: Once | ORAL | Status: AC
  Administered 2023-10-28: 2 mg via ORAL
  Filled 2023-10-28: qty 2.5

## 2023-10-28 MED ORDER — CEFTRIAXONE SODIUM 1 G IJ SOLR
1.0000 g | Freq: Once | INTRAMUSCULAR | Status: AC
  Administered 2023-10-28: 1 g via INTRAVENOUS
  Filled 2023-10-28: qty 10

## 2023-10-28 MED ORDER — SODIUM CHLORIDE 0.9 % IV BOLUS
1000.0000 mL | Freq: Once | INTRAVENOUS | Status: AC
  Administered 2023-10-28: 1000 mL via INTRAVENOUS

## 2023-10-28 NOTE — Discharge Instructions (Signed)
Please take the entire course of antibiotics that I prescribed and follow-up closely with his primary care doctor to ensure that his symptoms are improving with management.  You can use Motrin, Tylenol for fever or bodyaches.  Please return to the emergency department if symptoms are worsening despite treatment.

## 2023-10-28 NOTE — ED Notes (Addendum)
Witnessed RN Myra waste 8mg /24ml of Midazolam( Versed)

## 2023-10-28 NOTE — ED Triage Notes (Signed)
Pt has Cerebral palsy and is nonverbal. Pt's mother reports that pt has not been feeling well and when asked to point where he is hurting, he points to his groin area. She reports that pt is incontinent of urine at baseline and she has noticed a strong smell to is urine recently

## 2023-10-28 NOTE — ED Notes (Signed)
Patient attempted to climb out of bed per pt mother he is calmer in his chair staff assisted patient to chair and reclined him back, safety mittens reinforced

## 2023-10-28 NOTE — ED Provider Notes (Signed)
[name] EMERGENCY DEPARTMENT AT [facility] Provider Note   CSN: [id number] Arrival date & time: 10/28/23  4098     History  Chief Complaint  Patient presents with   Dysuria    Theodore Lewis is a 27 y.o. male with past medical history significant for cerebral palsy, congenital quadriparesis, seizure disorder, anxiety, history of obstructive hydrocephalus who presents with concern for feeling generally unwell, acting erratically.  When mother asked where he is hurting he points to his groin area.  She reports that he is incontinent of urine and she has noticed a strong smell to urine recently.  She also reports that has had several loose stools.  He reports he did have a few seizures yesterday which often happens when he is not getting any rest.  No seizures for longer than 5 minutes.   Dysuria Presenting symptoms: dysuria        Home Medications Prior to Admission medications   Medication Sig Start Date End Date Taking? Authorizing Provider  cephALEXin (KEFLEX) 250 MG/5ML suspension Take 5 mLs (250 mg total) by mouth 4 (four) times daily for 7 days. 10/28/23 11/04/23 Yes Anagabriela Jokerst H, PA-C  carbamazepine (CARBATROL) 300 MG 12 hr capsule Take 1 capsule (300 mg total) by mouth 2 (two) times daily. 10/20/23   Elveria Rising, NP  cetirizine HCl (ZYRTEC) 5 MG/5ML SOLN Take 10 mLs (10 mg total) by mouth daily. 08/02/21   Etta Grandchild, MD  diazepam (DIASTAT ACUDIAL) 10 MG GEL INSERT 10 MG RECTALLY AT ONSET OF SEIZURE 09/08/23   Elveria Rising, NP  diazepam (VALIUM) 1 MG/ML solution Give 1 ml at bedtime and give 1ml by mouth up to twice per day as needed for restlessness Patient not taking: Reported on 04/08/2023 04/13/21   Elveria Rising, NP  divalproex (DEPAKOTE SPRINKLES) 125 MG capsule TAKE 5 CAPSULE BY MOUTH TWICE DAILY 10/20/23   Elveria Rising, NP  famotidine (PEPCID) 40 MG/5ML suspension SHAKE LIQUID AND TAKE 5 ML(40 MG) BY MOUTH TWICE  DAILY Patient taking differently: as needed. 05/02/23   Zehr, Princella Pellegrini, PA-C  hydrOXYzine (ATARAX) 10 MG tablet Crush and give 1 tablet at bedtime Patient not taking: Reported on 09/12/2023 07/04/23   Elveria Rising, NP  ibuprofen (ADVIL) 100 MG/5ML suspension Take by mouth. 05/23/17   [provider]  ketoconazole (NIZORAL) 2 % cream Apply 1 Application topically 2 (two) times daily as needed for irritation. 09/12/23   Etta Grandchild, MD  linaclotide [facility]) 72 MCG capsule Take 1 capsule (72 mcg total) by mouth daily before breakfast. 09/12/23   Etta Grandchild, MD  Nutritional Supplements (DUOCAL) POWD Give 1 scoop into food or liquid 4 times per day 06/06/23   Elveria Rising, NP  Nutritional Supplements (NUTRITIONAL SUPPLEMENT PLUS) LIQD 1 Ensure Clear or Boost Breeze given PO daily. 08/08/22   Elveria Rising, NP  ondansetron [facility]) 4 MG/5ML solution Take 5 mLs (4 mg total) by mouth every 8 (eight) hours as needed for nausea or vomiting. 04/04/23   Roxy Horseman, PA-C      Allergies    Penicillins and Vancomycin    Review of Systems   Review of Systems  Genitourinary:  Positive for dysuria.  All other systems reviewed and are negative.   Physical Exam Updated Vital Signs BP 136/83 (BP Location: Right Leg)   Pulse 94   Temp (!) 97.5 F (36.4 C) (Oral)   Resp 16   SpO2 100%  Physical Exam Vitals and nursing  note reviewed.  Constitutional:      General: He is not in acute distress.    Appearance: Normal appearance.  HENT:     Head: Normocephalic and atraumatic.  Eyes:     General:        Right eye: No discharge.        Left eye: No discharge.  Cardiovascular:     Rate and Rhythm: Normal rate and regular rhythm.     Heart sounds: No murmur heard.    No friction rub. No gallop.  Pulmonary:     Effort: Pulmonary effort is normal.     Breath sounds: Normal breath sounds.  Abdominal:     General: Bowel sounds are normal.     Palpations: Abdomen is soft.   Genitourinary:    Comments: No redness, fluctuance, or abscess noted in groin Skin:    General: Skin is warm and dry.     Capillary Refill: Capillary refill takes less than 2 seconds.  Neurological:     Mental Status: He is alert and oriented to person, place, and time.  Psychiatric:        Mood and Affect: Mood normal.        Behavior: Behavior normal.     ED Results / Procedures / Treatments   Labs (all labs ordered are listed, but only abnormal results are displayed) Labs Reviewed  CBC WITH DIFFERENTIAL/PLATELET - Abnormal; Notable for the following components:      Result Value   Platelets 110 (*)    All other components within normal limits  COMPREHENSIVE METABOLIC PANEL - Abnormal; Notable for the following components:   Total Protein 8.3 (*)    All other components within normal limits  URINALYSIS, ROUTINE W REFLEX MICROSCOPIC - Abnormal; Notable for the following components:   APPearance HAZY (*)    Specific Gravity, Urine 1.031 (*)    Hgb urine dipstick LARGE (*)    Ketones, ur 20 (*)    Protein, ur 100 (*)    Bacteria, UA RARE (*)    All other components within normal limits  URINE CULTURE  LIPASE, BLOOD  CARBAMAZEPINE LEVEL, TOTAL    EKG None  Radiology No results found.  Procedures Procedures    Medications Ordered in ED Medications  midazolam (VERSED) 2 MG/ML syrup 2 mg (2 mg Oral Given 10/28/23 0644)  cefTRIAXone (ROCEPHIN) 1 g in sodium chloride 0.9 % 100 mL IVPB (0 g Intravenous Stopped 10/28/23 0939)  sodium chloride 0.9 % bolus 1,000 mL (1,000 mLs Intravenous New Bag/Given 10/28/23 0920)    ED Course/ Medical Decision Making/ A&P                                 Medical Decision Making Amount and/or Complexity of Data Reviewed Labs: ordered.  Risk Prescription drug management.   This patient is a 28 y.o. male  who presents to the ED for concern of agitation, altered mental status, dysuria.   Differential diagnoses prior to  evaluation: The emergent differential diagnosis includes, but is not limited to,  CVA, seizure, hypotension, sepsis, hypoglycemia, hypoxic encephalopathy, metabolic encephalopathy, polypharmacy, substance abuse, developing dementia or alzheimers, meningitis, encephalitis, hypertensive emergency, other systemic infection, acute alcohol intoxication, acute alcohol or other drug withdrawal or psychiatric manifestation vs other, UTI, pyelonephritis, versus other. This is not an exhaustive differential.   Past Medical History / Co-morbidities / Social History: cerebral palsy, congenital quadriparesis, seizure disorder,  anxiety, history of obstructive hydrocephalus  Additional history: Chart reviewed. Pertinent results include: Reviewed lab work, imaging from previous emergency department visits, he has had to receive oral Versed in the past to help with agitation and to get labs but is very calm thereafter.  Physical Exam: Physical exam performed. The pertinent findings include: At mental baseline, vital signs are stable other than some mild tachycardia on arrival, pulse of 105.  Oxygen saturation stable on room air, no tenderness to palpation of lower abdomen.  Some foul-smelling urine in diaper.  Lab Tests/Imaging studies: I personally interpreted labs/imaging and the pertinent results include: CBC unremarkable other than mild thrombocytopenia, platelets 110, CMP unremarkable, lipase normal, UA with large hemoglobin, rare bacteria and 6-10 white blood cells, in context of this patient's history I do think this is consistent with an early urinary tract infection, will plan to treat with Keflex, and send for urine culture.  Medications: I ordered medication including Rocephin, fluid bolus for tachycardia, cystitis, Versed for agitation.  I have reviewed the patients home medicines and have made adjustments as needed.   Disposition: After consideration of the diagnostic results and the patients response  to treatment, I feel that patient is stable for discharge with treatment for cystitis.   emergency department workup does not suggest an emergent condition requiring admission or immediate intervention beyond what has been performed at this time. The plan is: as above. The patient is safe for discharge and has been instructed to return immediately for worsening symptoms, change in symptoms or any other concerns.  Final Clinical Impression(s) / ED Diagnoses Final diagnoses:  Cystitis    Rx / DC Orders ED Discharge Orders          Ordered    cephALEXin (KEFLEX) 250 MG/5ML suspension  4 times daily        10/28/23 1055              Dardan Shelton, [place], PA-C 10/28/23 1058    Kommor, [place], MD 10/29/23 [phone]

## 2023-10-28 NOTE — ED Notes (Signed)
RN attempted I&O cath unsuccessful, condom cath applied

## 2023-10-28 NOTE — ED Notes (Addendum)
Wasted 8mg /88ml of Midazolam (Versed) with Darnelle Catalan, RN.

## 2023-10-28 NOTE — ED Notes (Signed)
Patient is asleep. Per caregiver, do not wake patient for vitals. Caregiver to notify once patient is awake

## 2023-10-28 NOTE — ED Notes (Signed)
Lab called to have culture added

## 2023-10-29 LAB — URINE CULTURE: Culture: <10000 — AB

## 2023-11-06 ENCOUNTER — Ambulatory Visit (INDEPENDENT_AMBULATORY_CARE_PROVIDER_SITE_OTHER): Payer: 59 | Admitting: Internal Medicine

## 2023-11-06 ENCOUNTER — Encounter: Payer: Self-pay | Admitting: Internal Medicine

## 2023-11-06 VITALS — HR 72 | Temp 96.9°F | Resp 16 | Ht <= 58 in | Wt 74.0 lb

## 2023-11-06 DIAGNOSIS — R3129 Other microscopic hematuria: Secondary | ICD-10-CM

## 2023-11-06 NOTE — Progress Notes (Signed)
 Subjective:  Patient ID: Theodore Lewis, male    DOB: July 27, 1996  Age: 28 y.o. MRN: [id number]  CC: Follow-up   HPI Theodore Lewis presents for f/up ----  Discussed the use of AI scribe software for clinical note transcription with the patient, who gave verbal consent to proceed.  History of Present Illness   The patient, with his mother today was recently seen in the ED for a urinary tract infection (UTI), presented with ongoing irritability and crankiness. The patient has completed a seven-day course of antibiotics for the UTI the day prior to the consultation. The patient's caregiver expressed concern about the patient's mood and behavior, noting that the patient had been particularly irritable and restless, with frequent episodes of sleep and wakefulness.  The patient had previously been diagnosed with a UTI in the hospital, where a urine specimen was obtained via a catheter due to difficulty in obtaining a sample. The patient's caregiver expressed uncertainty about the resolution of the UTI, and the patient was resistant to providing a new urine sample during the consultation.       Outpatient Medications Prior to Visit  Medication Sig Dispense Refill   carbamazepine  (CARBATROL ) 300 MG 12 hr capsule Take 1 capsule (300 mg total) by mouth 2 (two) times daily. 180 capsule 3   cetirizine  HCl (ZYRTEC ) 5 MG/5ML SOLN Take 10 mLs (10 mg total) by mouth daily. 473 mL 1   diazepam  (DIASTAT  ACUDIAL) 10 MG GEL INSERT 10 MG RECTALLY AT ONSET OF SEIZURE 3 each 0   divalproex  (DEPAKOTE  SPRINKLES) 125 MG capsule TAKE 5 CAPSULE BY MOUTH TWICE DAILY 310 capsule 3   famotidine  (PEPCID ) 40 MG/5ML suspension SHAKE LIQUID AND TAKE 5 ML(40 MG) BY MOUTH TWICE DAILY (Patient taking differently: as needed.) 300 mL 2   ibuprofen  (ADVIL ) 100 MG/5ML suspension Take by mouth.     ketoconazole  (NIZORAL ) 2 % cream Apply 1 Application topically 2 (two) times daily as needed for irritation. 60 g 2   linaclotide   (LINZESS ) 72 MCG capsule Take 1 capsule (72 mcg total) by mouth daily before breakfast. 90 capsule 0   Nutritional Supplements (DUOCAL) POWD Give 1 scoop into food or liquid 4 times per day     Nutritional Supplements (NUTRITIONAL SUPPLEMENT PLUS) LIQD 1 Ensure Clear or Boost Breeze given PO daily. 7347 mL 12   ondansetron  (ZOFRAN ) 4 MG/5ML solution Take 5 mLs (4 mg total) by mouth every 8 (eight) hours as needed for nausea or vomiting. 50 mL 0   diazepam  (VALIUM ) 1 MG/ML solution Give 1 ml at bedtime and give 1ml by mouth up to twice per day as needed for restlessness (Patient not taking: Reported on 11/06/2023) 90 mL 5   hydrOXYzine  (ATARAX ) 10 MG tablet Crush and give 1 tablet at bedtime (Patient not taking: Reported on 09/12/2023) 30 tablet 0   No facility-administered medications prior to visit.    ROS Review of Systems  Constitutional:  Negative for fatigue.  HENT: Negative.    Respiratory:  Negative for cough and chest tightness.   Cardiovascular:  Negative for chest pain, palpitations and leg swelling.  Gastrointestinal:  Negative for abdominal pain, diarrhea, nausea and vomiting.  Genitourinary: Negative.   Musculoskeletal: Negative.   Skin: Negative.   Hematological:  Negative for adenopathy. Does not bruise/bleed easily.  Psychiatric/Behavioral: Negative.      Objective:  Pulse 72   Temp (!) 96.9 F (36.1 C) (Temporal)   Resp 16   Ht 4' (1.219 m)  Wt 74 lb (33.6 kg)   BMI 22.58 kg/m   BP Readings from Last 3 Encounters:  10/28/23 136/83  09/12/23 121/85  08/21/23 92/82    Wt Readings from Last 3 Encounters:  11/06/23 74 lb (33.6 kg)  09/12/23 73 lb (33.1 kg)  09/11/23 73 lb 3.2 oz (33.2 kg)    Physical Exam Vitals reviewed.  Constitutional:      General: He is not in acute distress.    Appearance: He is not toxic-appearing or diaphoretic.     Comments: He did not allow us  to check a BP (he moved and pushed me away)  HENT:     Mouth/Throat:     Mouth:  Mucous membranes are moist.  Eyes:     General: No scleral icterus.    Conjunctiva/sclera: Conjunctivae normal.  Cardiovascular:     Rate and Rhythm: Normal rate and regular rhythm.     Heart sounds: No murmur heard. Pulmonary:     Breath sounds: No stridor. No wheezing, rhonchi or rales.  Abdominal:     General: Abdomen is flat.     Palpations: There is no mass.     Tenderness: There is no abdominal tenderness. There is no guarding.     Hernia: No hernia is present.  Musculoskeletal:        General: Normal range of motion.     Cervical back: Neck supple.     Right lower leg: No edema.     Left lower leg: No edema.  Lymphadenopathy:     Cervical: No cervical adenopathy.  Skin:    General: Skin is warm and dry.     Findings: No rash.  Neurological:     Mental Status: Mental status is at baseline.  Psychiatric:        Mood and Affect: Mood normal.     Lab Results  Component Value Date   WBC 5.8 10/28/2023   HGB 16.1 10/28/2023   HCT 47.8 10/28/2023   PLT 110 (L) 10/28/2023   GLUCOSE 89 10/28/2023   ALT 28 10/28/2023   AST 22 10/28/2023   NA 138 10/28/2023   K 3.5 10/28/2023   CL 101 10/28/2023   CREATININE 0.63 10/28/2023   BUN 13 10/28/2023   CO2 27 10/28/2023   TSH 3.16 11/21/2022    No results found.  Assessment & Plan:   Other microscopic hematuria- The UA was positive for RBC's but the culture was negative. -     Ambulatory referral to Urology -     Urinalysis, Routine w reflex microscopic; Future     Follow-up: Return in about 3 months (around 02/04/2024).  Debby Molt, MD

## 2023-11-06 NOTE — Patient Instructions (Signed)

## 2023-11-07 ENCOUNTER — Other Ambulatory Visit: Payer: 59

## 2023-11-07 DIAGNOSIS — R3129 Other microscopic hematuria: Secondary | ICD-10-CM

## 2023-11-09 ENCOUNTER — Emergency Department (HOSPITAL_COMMUNITY)
Admission: EM | Admit: 2023-11-09 | Discharge: 2023-11-09 | Disposition: A | Discharge status: Home / Self Care | Payer: 59 | Attending: Emergency Medicine | Admitting: Emergency Medicine

## 2023-11-09 ENCOUNTER — Encounter (HOSPITAL_COMMUNITY): Payer: Self-pay

## 2023-11-09 ENCOUNTER — Other Ambulatory Visit: Payer: Self-pay

## 2023-11-09 ENCOUNTER — Emergency Department (HOSPITAL_COMMUNITY): Payer: 59

## 2023-11-09 DIAGNOSIS — M79641 Pain in right hand: Secondary | ICD-10-CM | POA: Insufficient documentation

## 2023-11-09 NOTE — ED Triage Notes (Signed)
 Pt mother reports she wants him checked for a UTI, pt finished abx for UTI on 11/05/23 and she thinks something is wrong with pt right hand. Pt normally will eat with his right hand and would not eat with it today. Pt did eat a normal diet today, but had to be fed. Hx of cerebral palsy

## 2023-11-09 NOTE — Discharge Instructions (Signed)
 As discussed, your evaluation today has been largely reassuring.  But, it is important that you monitor your condition carefully, and do not hesitate to return to the ED if you develop new, or concerning changes in your condition. ? ?Otherwise, please follow-up with your physician for appropriate ongoing care. ? ?

## 2023-11-09 NOTE — ED Provider Notes (Signed)
 [place] EMERGENCY DEPARTMENT AT [facility] Provider Note   CSN: [id number] Arrival date & time: 11/09/23  1713     History  Chief Complaint  Patient presents with   Hand Pain    Theodore Lewis is a 28 y.o. male.  HPI Adult male presents with his mother provides a history.  Patient has cerebral palsy cannot provide details. Mother is primarily concerned about hand pain.  She additionally has questions about whether or not patient's urinary tract infection has resolved. In regards to patient's hand pain she notes that he typically fidgets with a metal binder clip, and today has seemingly been hesitant to use the right hand after using the clip, which snaps back-and-forth.  She notes that he typically uses the hand to feed himself but has not been doing some just today.  In regards to the patient's prior urinary tract infection he was diagnosed with this about 10 days ago, completed antibiotics a week ago, and has had no malodorous urine, no fever, no nausea, vomiting, no change in behavior. Prior to the previous diagnosis patient did have malodorous urine.     Home Medications Prior to Admission medications   Medication Sig Start Date End Date Taking? Authorizing Provider  carbamazepine  (CARBATROL ) 300 MG 12 hr capsule Take 1 capsule (300 mg total) by mouth 2 (two) times daily. 10/20/23   Marianna City, NP  cetirizine  HCl (ZYRTEC ) 5 MG/5ML SOLN Take 10 mLs (10 mg total) by mouth daily. 08/02/21   Magdaleno Debby CROME, MD  diazepam  (DIASTAT  ACUDIAL) 10 MG GEL INSERT 10 MG RECTALLY AT ONSET OF SEIZURE 09/08/23   Marianna City, NP  diazepam  (VALIUM ) 1 MG/ML solution Give 1 ml at bedtime and give 1ml by mouth up to twice per day as needed for restlessness Patient not taking: Reported on 11/06/2023 04/13/21   Marianna City, NP  divalproex  (DEPAKOTE  SPRINKLES) 125 MG capsule TAKE 5 CAPSULE BY MOUTH TWICE DAILY 10/20/23   Marianna City, NP  famotidine  (PEPCID ) 40  MG/5ML suspension SHAKE LIQUID AND TAKE 5 ML(40 MG) BY MOUTH TWICE DAILY Patient taking differently: as needed. 05/02/23   Zehr, Jessica D, PA-C  hydrOXYzine  (ATARAX ) 10 MG tablet Crush and give 1 tablet at bedtime Patient not taking: Reported on 09/12/2023 07/04/23   Marianna City, NP  ibuprofen  (ADVIL ) 100 MG/5ML suspension Take by mouth. 05/23/17   [provider]  ketoconazole  (NIZORAL ) 2 % cream Apply 1 Application topically 2 (two) times daily as needed for irritation. 09/12/23   Aidan Debby CROME, MD  linaclotide  (LINZESS ) 72 MCG capsule Take 1 capsule (72 mcg total) by mouth daily before breakfast. 09/12/23   Farrell Debby CROME, MD  Nutritional Supplements (DUOCAL) POWD Give 1 scoop into food or liquid 4 times per day 06/06/23   Marianna City, NP  Nutritional Supplements (NUTRITIONAL SUPPLEMENT PLUS) LIQD 1 Ensure Clear or Boost Breeze given PO daily. 08/08/22   Marianna City, NP  ondansetron  (ZOFRAN ) 4 MG/5ML solution Take 5 mLs (4 mg total) by mouth every 8 (eight) hours as needed for nausea or vomiting. 04/04/23   Vicky [place], PA-C      Allergies    Penicillins and Vancomycin     Review of Systems   Review of Systems  Physical Exam Updated Vital Signs BP (!) 145/116   Pulse 96   Temp (!) 97.4 F (36.3 C) (Axillary)   Resp 18   Ht 4' (1.219 m)   Wt 33.5 kg   SpO2 98%  BMI 22.54 kg/m  Physical Exam Vitals and nursing note reviewed.  Constitutional:      General: He is not in acute distress.    Appearance: He is well-developed.     Comments: Young male with clear stigmata of cerebral palsy in a wheelchair, contractions in multiple extremities, but moving his right arm freely, including moving into his face, grabbing at items, shaking my hand.  HENT:     Head: Normocephalic and atraumatic.  Eyes:     Conjunctiva/sclera: Conjunctivae normal.  Cardiovascular:     Rate and Rhythm: Normal rate and regular rhythm.  Pulmonary:     Effort: Pulmonary effort is  normal. No respiratory distress.     Breath sounds: No stridor.  Abdominal:     General: There is no distension.  Skin:    General: Skin is warm and dry.  Neurological:     Comments: Clear stigmata of cerebral palsy  Psychiatric:        Cognition and Memory: Cognition is impaired. Memory is impaired.     ED Results / Procedures / Treatments   Labs (all labs ordered are listed, but only abnormal results are displayed) Labs Reviewed - No data to display  EKG None  Radiology DG Hand 2 View Right Result Date: 11/09/2023 CLINICAL DATA:  Hand pain. EXAM: RIGHT HAND - 2 VIEW COMPARISON:  None Available. FINDINGS: There is no evidence of fracture or dislocation. There is no evidence of arthropathy or other focal bone abnormality. Soft tissues are unremarkable. IMPRESSION: Negative. Electronically Signed   By: Greig Pique M.D.   On: 11/09/2023 18:44    Procedures Procedures    Medications Ordered in ED Medications - No data to display  ED Course/ Medical Decision Making/ A&P                                 Medical Decision Making Patient in no distress presents with his mother with concern for hand pain.  Patient may have soft tissue injury versus bone, no evidence for cutaneous infection.  X-ray ordered. Mother is concerned about urinary tract infection discussed at length, and on review the patient's recent urinary culture did not have appreciable growth, and he has no stigmata of infection currently.  Given risk of possible sedation versus restraint to obtain a urine sample from this patient, in the context of no hemodynamic instability, no description of change in behavior, no findings similar to those he experienced prior to previous diagnosis, patient will follow-up as an outpatient in this regard.  Amount and/or Complexity of Data Reviewed Independent Historian: parent External Data Reviewed: notes.    Details: Notes from last month reviewed Radiology: ordered and  independent interpretation performed. Decision-making details documented in ED Course.  Risk Diagnosis or treatment significantly limited by social determinants of health.   7:40 PM Patient in no distress on repeat exam, again shaking my hand.  I reviewed the x-ray, again discussed need for close outpatient follow-up and patient discharged in stable condition.        Final Clinical Impression(s) / ED Diagnoses Final diagnoses:  Right hand pain    Rx / DC Orders ED Discharge Orders     None         Garrick Charleston, MD 11/09/23 1940

## 2023-11-10 ENCOUNTER — Telehealth: Payer: Self-pay | Admitting: *Deleted

## 2023-11-10 NOTE — Progress Notes (Signed)
 Transition Care Management Unsuccessful Follow-up Telephone Call  Date of discharge and from where:  [facility] 10/28/2023  Attempts:  1st Attempt  Reason for unsuccessful TCM follow-up call:  No answer/busy

## 2023-11-11 ENCOUNTER — Telehealth (INDEPENDENT_AMBULATORY_CARE_PROVIDER_SITE_OTHER): Payer: Self-pay | Admitting: Family

## 2023-11-11 DIAGNOSIS — R451 Restlessness and agitation: Secondary | ICD-10-CM

## 2023-11-11 MED ORDER — DIAZEPAM 1 MG/ML PO SOLN: ORAL | 5 refills | Status: DC

## 2023-11-11 NOTE — Telephone Encounter (Signed)
 Contacted patients mother.  Verified patients name and DOB as well as mothers name.  Mom stated that he is being very aggressive. He is starting to hit his head on things like doors, beds and other objects as well as his hand.   Mom stated that this stated happening after he went in for a UtI. His doctor did discover blood in his urine.  He has an appointment with urology tomorrow to see if he has Kidney stones. Mom has been unable to obtain a sample.   Mom says that he hasn't been himself. Mom thinks he may have injured his hand somehow because he has been staring at his hand. Mom states that he plays with a clip and thought hurt himself playing with with the clip. He hasn't been eating as usual. She stated that she has to feed him herself because he will not feed himself.   Mom took him to the ER an had his hand X-rayed and they saw that his hand was fine.   Mom stated that she has caught him hitting himself with the clip in his hand.   Mom stated that he has been restless, she has been giving him Ibuprofen  and Tylenol  which seems to soothe him for awhile.   Mom stated that she has ceased from giving him sodas and he loves sodas and she believes that may be contributing to his pain. Mom also stated that the patient may be tired of sitting in the house.   Mom is stated that he needs some medicine to help him rest so that she can get some rest.   I informed mom that I would relay this message to provider. Mom verbalized understanding of this.  SS, CCMA

## 2023-11-11 NOTE — Telephone Encounter (Signed)
 I called and spoke with Mom. I recommended low dose Diazepam to help with restlessness. She agreed with this plan and I sent the Rx to [facility] as requested. TG

## 2023-11-11 NOTE — Telephone Encounter (Signed)
  Name of who is calling: Theodore Lewis Relationship to Patient: Mom  Best contact number: [phone]  Provider they see: Ellouise Bollman   Reason for call: Mom called and stated that she has some questions and concerns regarding [name]. She wasn't sure if she should schedule an appt. Mom is requesting a callback.       PRESCRIPTION REFILL ONLY  Name of prescription:  Pharmacy:

## 2023-11-12 ENCOUNTER — Encounter: Payer: Self-pay | Admitting: Urology

## 2023-11-12 ENCOUNTER — Ambulatory Visit: Payer: 59 | Admitting: Urology

## 2023-11-12 VITALS — BP 108/74 | HR 81 | Wt 74.0 lb

## 2023-11-12 DIAGNOSIS — R3129 Other microscopic hematuria: Secondary | ICD-10-CM | POA: Diagnosis not present

## 2023-11-12 NOTE — Progress Notes (Signed)
 Assessment: 1. Other microscopic hematuria      Plan: Discussed implications of quite significant micro heme (>50 rbc/hpf) and recommendations for evaluation including UT imaging (CT-heme protocol) which will need to be done under anesthesia.  I also wanted to send heme profile today but with cath only got a few drops of urine.  FU after CT.  Consider repeat cath UA for heme profile and ? Cx bladder detect.  Chief Complaint: blood in urine  History of Present Illness:  Theodore Lewis is a 28 y.o. male with pmhx of cerebral palsy, congenital quadriparesis, seizure disorder, anxiety, history of obstructive hydrocephalus who is seen in consultation from Jaycob Debby CROME, MD for evaluation of microscopic hematuria. Presented to the emergency department last month with concerns regarding some discomfort in the groin region and was felt to have possible cystitis.  He did have some pyuria and significant microscopic hematuria (>50 rbc/hpf) which was also noted on urinalysis earlier this year as well.  Urine culture grew less than 10,000 colonies.  He was treated empirically with Keflex .  Has nl renal function.  Past Medical History:  Past Medical History:  Diagnosis Date   Anxiety    Cerebral palsy (HCC)    Congenital quadriparesis (HCC)    Neuromuscular disorder (HCC)    Seizure (HCC)    Seizures (HCC)    last seizure 3 yrs ago    Past Surgical History:  Past Surgical History:  Procedure Laterality Date   BRAIN SURGERY     Shunt placed when he was a yr. old   I & D EXTREMITY Bilateral 04/29/2014   Procedure: IRRIGATION AND DEBRIDEMENT EXTREMITY;  Surgeon: Oneil JAYSON Herald, MD;  Location: [facility] OR;  Service: Orthopedics;  Laterality: Bilateral;  Right and Left Hamstring Lenghening and Fiberglass Casting   LEG TENDON SURGERY Bilateral approx 6 yrs ago    Allergies:  Allergies  Allergen Reactions   Penicillins Other (See Comments)    Right side of body started shaking     Vancomycin   Rash    Family History:  Family History  Problem Relation Age of Onset   Hyperlipidemia Mother    Heart disease Mother    Diabetes Mother    Healthy Mother    Learning disabilities Father    Hypertension Father    Hyperlipidemia Father    Heart disease Father    Diabetes Father    Hypertension Maternal Grandmother    Intellectual disability Maternal Grandfather    Hypertension Maternal Grandfather    Hyperlipidemia Maternal Grandfather    Heart disease Maternal Grandfather    Cancer Paternal Grandmother    Early death Paternal Grandmother    Stomach cancer Paternal Grandmother        Died at 42   Cancer Paternal Grandfather    Early death Paternal Grandfather    Throat cancer Paternal Grandfather        Died at 65   Stomach cancer Paternal Grandfather    Liver disease Neg Hx    Esophageal cancer Neg Hx    Colon cancer Neg Hx     Social History:  Social History   Tobacco Use   Smoking status: Never    Passive exposure: Never   Smokeless tobacco: Never  Substance Use Topics   Alcohol use: No   Drug use: No    Review of symptoms:  Constitutional:  Negative for unexplained weight loss, night sweats, fever, chills ENT:  Negative for nose bleeds, sinus pain, painful swallowing  CV:  Negative for chest pain, shortness of breath, exercise intolerance, palpitations, loss of consciousness Resp:  Negative for cough, wheezing, shortness of breath GI:  Negative for nausea, vomiting, diarrhea, bloody stools GU:  Positives noted in HPI; otherwise negative for gross hematuria, dysuria, urinary incontinence Neuro:  Negative for seizures, poor balance, limb weakness, slurred speech Psych:  Negative for lack of energy, depression, anxiety Endocrine:  Negative for polydipsia, polyuria, symptoms of hypoglycemia (dizziness, hunger, sweating) Hematologic:  Negative for anemia, purpura, petechia, prolonged or excessive bleeding, use of anticoagulants  Allergic:  Negative for  difficulty breathing or choking as a result of exposure to anything; no shellfish allergy; no allergic response (rash/itch) to materials, foods  Physical exam: BP 108/74   Pulse 81   Wt 74 lb (33.6 kg)   BMI 22.58 kg/m  GENERAL APPEARANCE:  28 yo bm with CP in wheelchair, NAD

## 2023-11-17 ENCOUNTER — Telehealth (INDEPENDENT_AMBULATORY_CARE_PROVIDER_SITE_OTHER): Payer: Self-pay | Admitting: Family

## 2023-11-17 DIAGNOSIS — R451 Restlessness and agitation: Secondary | ICD-10-CM

## 2023-11-17 DIAGNOSIS — F71 Moderate intellectual disabilities: Secondary | ICD-10-CM

## 2023-11-17 DIAGNOSIS — J301 Allergic rhinitis due to pollen: Secondary | ICD-10-CM

## 2023-11-17 MED ORDER — DIAZEPAM 5 MG/5ML PO SOLN: ORAL | 5 refills | Status: DC

## 2023-11-17 NOTE — Telephone Encounter (Signed)
 Attempted to contact patients mother. Mother unable to be reached.  Unable to LVM.  SS, CCMA

## 2023-11-17 NOTE — Telephone Encounter (Signed)
 Please let Mom know that I sent in a different prescription. It is the same medication - but the formulation is different so the pharmacy should have it. Thanks, Inetta Fermo

## 2023-11-17 NOTE — Telephone Encounter (Signed)
 Contacted patients mother.  Verified patients name and DOB as well as mothers name.   I confirmed with the pharmacy that they are having issues getting the medication. I asked mom if she would be able to see it other pharmacies in her area would be able to fill this prescription.   Patients mother stated that she did not know the name of the medication and she did not have a pen to write it down. Mom also stated that Andres has never been on this medication before.   I reiterated the name of the medication. Patients mother stated that she would call around.   SS, CCMA

## 2023-11-17 NOTE — Telephone Encounter (Signed)
  Name of who is calling: Candis Millard Relationship to Patient: mom   Best contact number: [phone]  Provider they see: tina  Reason for call: called regarding a medication (she wasn't sure of name may be diazepam , she mentioned 1MG ) she says pharmacy informed her that they no longer can get manufacture for medication. She would like a call back to  see how to go about getting this medication     PRESCRIPTION REFILL ONLY  Name of prescription:  Pharmacy:

## 2023-11-18 NOTE — Telephone Encounter (Signed)
 Attempted to contact patients mother. Mother unable to be reached.  Unable to LVM.  SS, CCMA

## 2023-11-19 ENCOUNTER — Telehealth (INDEPENDENT_AMBULATORY_CARE_PROVIDER_SITE_OTHER): Payer: Self-pay | Admitting: Family

## 2023-11-19 NOTE — Telephone Encounter (Signed)
 Contacted patient pharmacy to confirm this.   The representative that I spoke to stated that they did not receive the RX for 49mL/5mL solution. She confirmed that they do not have this formulation in stock either.   SS, CCMA

## 2023-11-19 NOTE — Telephone Encounter (Signed)
 A user error has taken place: encounter opened in error, closed for administrative reasons.

## 2023-11-19 NOTE — Telephone Encounter (Signed)
  Name of who is calling: Katharine Paling Relationship to Patient: mom   Best contact number: [phone]  Provider they see: tina   Reason for call: mom called again stating that [facility] was unable to fill medication since they did not carry it. She says [facility] [name] and they have it. She would like a call back to confirm it can be sent there instead.      PRESCRIPTION REFILL ONLY  Name of prescription:  Pharmacy:

## 2023-11-19 NOTE — Telephone Encounter (Signed)
 Mom called back.  I reiterated the previous message from the provider.   Mom verbalized understanding of this.   SS, CCMA

## 2023-11-19 NOTE — Addendum Note (Signed)
 Addended by: Verdia Glad N on: 11/19/2023 11:03 AM   Modules accepted: Orders

## 2023-11-24 ENCOUNTER — Encounter: Payer: Self-pay | Admitting: Internal Medicine

## 2023-11-24 ENCOUNTER — Ambulatory Visit (INDEPENDENT_AMBULATORY_CARE_PROVIDER_SITE_OTHER): Payer: 59 | Admitting: Internal Medicine

## 2023-11-24 VITALS — BP 106/70 | HR 86 | Ht <= 58 in | Wt 70.2 lb

## 2023-11-24 DIAGNOSIS — D696 Thrombocytopenia, unspecified: Secondary | ICD-10-CM

## 2023-11-24 DIAGNOSIS — Z0000 Encounter for general adult medical examination without abnormal findings: Secondary | ICD-10-CM

## 2023-11-24 DIAGNOSIS — Z0001 Encounter for general adult medical examination with abnormal findings: Secondary | ICD-10-CM

## 2023-11-24 NOTE — Progress Notes (Unsigned)
Subjective:  Patient ID: Theodore Lewis, male    DOB: [birth year]  Age: 28 y.o. MRN: [id number]  CC: No chief complaint on file.   HPI JAYLEND SCHIERER presents for ***  Outpatient Medications Prior to Visit  Medication Sig Dispense Refill   carbamazepine (CARBATROL) 300 MG 12 hr capsule Take 1 capsule (300 mg total) by mouth 2 (two) times daily. 180 capsule 3   cetirizine HCl (ZYRTEC) 5 MG/5ML SOLN Take 10 mLs (10 mg total) by mouth daily. 473 mL 1   diazepam (DIASTAT ACUDIAL) 10 MG GEL INSERT 10 MG RECTALLY AT ONSET OF SEIZURE 3 each 0   diazePAM 5 MG/5ML SOLN Take 1 mL (1 mg total) by mouth every morning AND 1 mL (1 mg total) daily after lunch AND 2 mLs (2 mg total) at bedtime. 120 mL 5   divalproex (DEPAKOTE SPRINKLES) 125 MG capsule TAKE 5 CAPSULE BY MOUTH TWICE DAILY 310 capsule 3   famotidine (PEPCID) 40 MG/5ML suspension SHAKE LIQUID AND TAKE 5 ML(40 MG) BY MOUTH TWICE DAILY (Patient taking differently: as needed.) 300 mL 2   hydrOXYzine (ATARAX) 10 MG tablet Crush and give 1 tablet at bedtime 30 tablet 0   ibuprofen (ADVIL) 100 MG/5ML suspension Take by mouth.     ketoconazole (NIZORAL) 2 % cream Apply 1 Application topically 2 (two) times daily as needed for irritation. 60 g 2   linaclotide (LINZESS) 72 MCG capsule Take 1 capsule (72 mcg total) by mouth daily before breakfast. 90 capsule 0   Nutritional Supplements (DUOCAL) POWD Give 1 scoop into food or liquid 4 times per day     Nutritional Supplements (NUTRITIONAL SUPPLEMENT PLUS) LIQD 1 Ensure Clear or Boost Breeze given PO daily. 7347 mL 12   ondansetron (ZOFRAN) 4 MG/5ML solution Take 5 mLs (4 mg total) by mouth every 8 (eight) hours as needed for nausea or vomiting. 50 mL 0   No facility-administered medications prior to visit.    ROS Review of Systems  Objective:  BP 106/70 (BP Location: Left Arm, Patient Position: Sitting, Cuff Size: Small)   Pulse 86   Ht 4' (1.219 m)   Wt 70 lb 3.2 oz (31.8 kg)   SpO2 93%    BMI 21.42 kg/m   BP Readings from Last 3 Encounters:  11/24/23 106/70  11/12/23 108/74  11/09/23 (!) 145/116    Wt Readings from Last 3 Encounters:  11/24/23 70 lb 3.2 oz (31.8 kg)  11/12/23 74 lb (33.6 kg)  11/09/23 73 lb 13.7 oz (33.5 kg)    Physical Exam  Lab Results  Component Value Date   WBC 5.8 10/28/2023   HGB 16.1 10/28/2023   HCT 47.8 10/28/2023   PLT 110 (L) 10/28/2023   GLUCOSE 89 10/28/2023   ALT 28 10/28/2023   AST 22 10/28/2023   NA 138 10/28/2023   K 3.5 10/28/2023   CL 101 10/28/2023   CREATININE 0.63 10/28/2023   BUN 13 10/28/2023   CO2 27 10/28/2023   TSH 3.16 11/21/2022    DG Hand 2 View Right Result Date: 11/09/2023 CLINICAL DATA:  Hand pain. EXAM: RIGHT HAND - 2 VIEW COMPARISON:  None Available. FINDINGS: There is no evidence of fracture or dislocation. There is no evidence of arthropathy or other focal bone abnormality. Soft tissues are unremarkable. IMPRESSION: Negative. Electronically Signed   By: Darliss Cheney M.D.   On: 11/09/2023 18:44    Assessment & Plan:  There are no diagnoses linked to this  encounter.   Follow-up: No follow-ups on file.  Sanda Linger, MD

## 2023-11-24 NOTE — Patient Instructions (Signed)
Health Maintenance, Male Adopting a healthy lifestyle and getting preventive care are important in promoting health and wellness. Ask your health care provider about: The right schedule for you to have regular tests and exams. Things you can do on your own to prevent diseases and keep yourself healthy. What should I know about diet, weight, and exercise? Eat a healthy diet  Eat a diet that includes plenty of vegetables, fruits, low-fat dairy products, and lean protein. Do not eat a lot of foods that are high in solid fats, added sugars, or sodium. Maintain a healthy weight Body mass index (BMI) is a measurement that can be used to identify possible weight problems. It estimates body fat based on height and weight. Your health care provider can help determine your BMI and help you achieve or maintain a healthy weight. Get regular exercise Get regular exercise. This is one of the most important things you can do for your health. Most adults should: Exercise for at least 150 minutes each week. The exercise should increase your heart rate and make you sweat (moderate-intensity exercise). Do strengthening exercises at least twice a week. This is in addition to the moderate-intensity exercise. Spend less time sitting. Even light physical activity can be beneficial. Watch cholesterol and blood lipids Have your blood tested for lipids and cholesterol at 28 years of age, then have this test every 5 years. You may need to have your cholesterol levels checked more often if: Your lipid or cholesterol levels are high. You are older than 28 years of age. You are at high risk for heart disease. What should I know about cancer screening? Many types of cancers can be detected early and may often be prevented. Depending on your health history and family history, you may need to have cancer screening at various ages. This may include screening for: Colorectal cancer. Prostate cancer. Skin cancer. Lung  cancer. What should I know about heart disease, diabetes, and high blood pressure? Blood pressure and heart disease High blood pressure causes heart disease and increases the risk of stroke. This is more likely to develop in people who have high blood pressure readings or are overweight. Talk with your health care provider about your target blood pressure readings. Have your blood pressure checked: Every 3-5 years if you are 18-39 years of age. Every year if you are 40 years old or older. If you are between the ages of 65 and 75 and are a current or former smoker, ask your health care provider if you should have a one-time screening for abdominal aortic aneurysm (AAA). Diabetes Have regular diabetes screenings. This checks your fasting blood sugar level. Have the screening done: Once every three years after age 45 if you are at a normal weight and have a low risk for diabetes. More often and at a younger age if you are overweight or have a high risk for diabetes. What should I know about preventing infection? Hepatitis B If you have a higher risk for hepatitis B, you should be screened for this virus. Talk with your health care provider to find out if you are at risk for hepatitis B infection. Hepatitis C Blood testing is recommended for: Everyone born from 1945 through 1965. Anyone with known risk factors for hepatitis C. Sexually transmitted infections (STIs) You should be screened each year for STIs, including gonorrhea and chlamydia, if: You are sexually active and are younger than 28 years of age. You are older than 28 years of age and your   health care provider tells you that you are at risk for this type of infection. Your sexual activity has changed since you were last screened, and you are at increased risk for chlamydia or gonorrhea. Ask your health care provider if you are at risk. Ask your health care provider about whether you are at high risk for HIV. Your health care provider  may recommend a prescription medicine to help prevent HIV infection. If you choose to take medicine to prevent HIV, you should first get tested for HIV. You should then be tested every 3 months for as long as you are taking the medicine. Follow these instructions at home: Alcohol use Do not drink alcohol if your health care provider tells you not to drink. If you drink alcohol: Limit how much you have to 0-2 drinks a day. Know how much alcohol is in your drink. In the [place] one drink equals one 12 oz bottle of beer (355 mL), one 5 oz glass of wine (148 mL), or one 1 oz glass of hard liquor (44 mL). Lifestyle Do not use any products that contain nicotine or tobacco. These products include cigarettes, chewing tobacco, and vaping devices, such as e-cigarettes. If you need help quitting, ask your health care provider. Do not use street drugs. Do not share needles. Ask your health care provider for help if you need support or information about quitting drugs. General instructions Schedule regular health, dental, and eye exams. Stay current with your vaccines. Tell your health care provider if: You often feel depressed. You have ever been abused or do not feel safe at home. Summary Adopting a healthy lifestyle and getting preventive care are important in promoting health and wellness. Follow your health care provider's instructions about healthy diet, exercising, and getting tested or screened for diseases. Follow your health care provider's instructions on monitoring your cholesterol and blood pressure. This information is not intended to replace advice given to you by your health care provider. Make sure you discuss any questions you have with your health care provider. Document Revised: 03/12/2021 Document Reviewed: 03/12/2021 Elsevier Patient Education  2024 Elsevier Inc.  

## 2023-11-26 ENCOUNTER — Encounter: Payer: Self-pay | Admitting: Internal Medicine

## 2023-11-26 DIAGNOSIS — Z0001 Encounter for general adult medical examination with abnormal findings: Secondary | ICD-10-CM | POA: Insufficient documentation

## 2023-12-02 ENCOUNTER — Other Ambulatory Visit (HOSPITAL_COMMUNITY): Payer: 59

## 2023-12-12 ENCOUNTER — Other Ambulatory Visit: Payer: Self-pay

## 2023-12-12 ENCOUNTER — Encounter (HOSPITAL_COMMUNITY): Payer: Self-pay | Admitting: *Deleted

## 2023-12-12 NOTE — Progress Notes (Signed)
 PCP - Dr. Debby Molt Cardiologist - denies Neurologist- Ellouise Bollman, NP  PPM/ICD - denies   Chest x-ray - 03/12/21 EKG - denies Stress Test - denies ECHO - denies Cardiac Cath - denies  CPAP - denies  DM- denies  ASA/Blood Thinner Instructions: n/a   ERAS Protcol - clears until 07:00  COVID TEST- n/a  Anesthesia review: yes  Patient verbally denies any shortness of breath, fever, cough and chest pain during phone call      Questions were answered. Patient verbalized understanding of instructions.

## 2023-12-12 NOTE — Pre-Procedure Instructions (Signed)
-------------    SDW INSTRUCTIONS given:  Your procedure is scheduled on 2/10.  Report to [facility] Main Entrance A at 07:30 A.M., and check in at the Admitting office.  Any questions or running late day of surgery: call [phone]    Remember:  Do not eat after midnight the night before your surgery  You may drink clear liquids until 07:00 AM the morning of your surgery.   Clear liquids allowed are: Water, Non-Citrus Juices (without pulp), Carbonated Beverages, Clear Tea, Black Coffee Only, and Gatorade    Take these medicines the morning of surgery with A SIP OF WATER  carbamazepine , zyrtec  PRN, diazepam  PRN, depakote      As of today, STOP taking any Aspirin  (unless otherwise instructed by your surgeon) Aleve, Naproxen, Ibuprofen , Motrin , Advil , Goody's, BC's, all herbal medications, fish oil, and all vitamins.   Do NOT Smoke (Tobacco/Vaping) 24 hours prior to your procedure  If you use a CPAP at night, you may bring all equipment for your overnight stay.     You will be asked to remove any contacts, glasses, piercing's, hearing aid's, dentures/partials prior to surgery. Please bring cases for these items if needed.     Patients discharged the day of surgery will not be allowed to drive home, and someone needs to stay with them for 24 hours.  SURGICAL WAITING ROOM VISITATION Patients may have no more than 2 support people in the waiting area - these visitors may rotate.   Pre-op nurse will coordinate an appropriate time for 1 ADULT support person, who may not rotate, to accompany patient in pre-op.  Children under the age of 63 must have an adult with them who is not the patient and must remain in the main waiting area with an adult.  If the patient needs to stay at the hospital during part of their recovery, the visitor guidelines for inpatient rooms apply.  Please refer to the [facility] website for the visitor guidelines for any additional information.   Special  instructions:   [place]- Preparing For Surgery   Please follow these instructions carefully.   Shower the NIGHT BEFORE SURGERY and the MORNING OF SURGERY with DIAL Soap.   Pat yourself dry with a CLEAN TOWEL.  Wear CLEAN PAJAMAS to bed the night before surgery  Place CLEAN SHEETS on your bed the night of your first shower and DO NOT SLEEP WITH PETS.   Additional instructions for the day of surgery: DO NOT APPLY any lotions, deodorants, cologne, or perfumes.   Do not wear jewelry or makeup Do not wear nail polish, gel polish, artificial nails, or any other type of covering on natural nails (fingers and toes) Do not bring valuables to the hospital. [facility] is not responsible for valuables/personal belongings. Put on clean/comfortable clothes.  Please brush your teeth.  Ask your nurse before applying any prescription medications to the skin.

## 2023-12-13 NOTE — Anesthesia Preprocedure Evaluation (Addendum)
 Anesthesia Evaluation  Patient identified by MRN, date of birth, ID band Patient awake    Reviewed: Allergy & Precautions, H&P , NPO status , Patient's Chart, lab work & pertinent test results  Airway Mallampati: II  TM Distance: >3 FB Neck ROM: Full    Dental no notable dental hx. (+) Teeth Intact, Dental Advisory Given   Pulmonary neg pulmonary ROS   Pulmonary exam normal breath sounds clear to auscultation       Cardiovascular negative cardio ROS  Rhythm:Regular Rate:Normal     Neuro/Psych Seizures -,  PSYCHIATRIC DISORDERS Anxiety      Neuromuscular disease    GI/Hepatic negative GI ROS, Neg liver ROS,,,  Endo/Other  negative endocrine ROS    Renal/GU negative Renal ROSMicroscopic hematuria   negative genitourinary   Musculoskeletal negative musculoskeletal ROS (+)    Abdominal   Peds  Hematology negative hematology ROS (+)   Anesthesia Other Findings   Reproductive/Obstetrics negative OB ROS                             Anesthesia Physical Anesthesia Plan  ASA: 3  Anesthesia Plan: General   Post-op Pain Management:    Induction: Intravenous  PONV Risk Score and Plan: 2 and Treatment may vary due to age or medical condition, Ondansetron  and Midazolam   Airway Management Planned: LMA  Additional Equipment: None  Intra-op Plan:   Post-operative Plan: Extubation in OR  Informed Consent: I have reviewed the patients History and Physical, chart, labs and discussed the procedure including the risks, benefits and alternatives for the proposed anesthesia with the patient or authorized representative who has indicated his/her understanding and acceptance.     Dental advisory given  Plan Discussed with: CRNA  Anesthesia Plan Comments:         Anesthesia Quick Evaluation

## 2023-12-15 ENCOUNTER — Other Ambulatory Visit: Payer: Self-pay

## 2023-12-15 ENCOUNTER — Encounter (HOSPITAL_COMMUNITY): Payer: Self-pay

## 2023-12-15 ENCOUNTER — Encounter (HOSPITAL_COMMUNITY)
Admission: AD | Disposition: A | Discharge status: Home / Self Care | Payer: Self-pay | Source: Ambulatory Visit | Attending: Urology

## 2023-12-15 ENCOUNTER — Ambulatory Visit (HOSPITAL_BASED_OUTPATIENT_CLINIC_OR_DEPARTMENT_OTHER): Payer: 59 | Admitting: Physician Assistant

## 2023-12-15 ENCOUNTER — Ambulatory Visit (HOSPITAL_COMMUNITY)
Admission: AD | Admit: 2023-12-15 | Discharge: 2023-12-15 | Disposition: A | Discharge status: Home / Self Care | Payer: 59 | Source: Ambulatory Visit | Attending: Urology | Admitting: Urology

## 2023-12-15 ENCOUNTER — Ambulatory Visit (HOSPITAL_COMMUNITY)
Admission: RE | Admit: 2023-12-15 | Discharge: 2023-12-15 | Disposition: A | Discharge status: Home / Self Care | Payer: 59 | Source: Ambulatory Visit | Attending: Urology | Admitting: Urology

## 2023-12-15 ENCOUNTER — Ambulatory Visit (HOSPITAL_COMMUNITY): Payer: 59 | Admitting: Physician Assistant

## 2023-12-15 DIAGNOSIS — N200 Calculus of kidney: Secondary | ICD-10-CM | POA: Insufficient documentation

## 2023-12-15 DIAGNOSIS — F419 Anxiety disorder, unspecified: Secondary | ICD-10-CM | POA: Diagnosis not present

## 2023-12-15 DIAGNOSIS — R3129 Other microscopic hematuria: Secondary | ICD-10-CM

## 2023-12-15 DIAGNOSIS — K8020 Calculus of gallbladder without cholecystitis without obstruction: Secondary | ICD-10-CM | POA: Diagnosis not present

## 2023-12-15 DIAGNOSIS — G808 Other cerebral palsy: Secondary | ICD-10-CM | POA: Diagnosis not present

## 2023-12-15 HISTORY — PX: RADIOLOGY WITH ANESTHESIA: SHX6223

## 2023-12-15 SURGERY — CT WITH ANESTHESIA
Anesthesia: General

## 2023-12-15 MED ORDER — CHLORHEXIDINE GLUCONATE 0.12 % MT SOLN: 15.0000 mL | Freq: Once | OROMUCOSAL | Status: DC

## 2023-12-15 MED ORDER — MIDAZOLAM HCL 2 MG/ML PO SYRP
15.0000 mg | ORAL_SOLUTION | Freq: Once | ORAL | Status: AC
  Administered 2023-12-15: 15 mg via ORAL
  Filled 2023-12-15: qty 10

## 2023-12-15 MED ORDER — ONDANSETRON HCL 4 MG/2ML IJ SOLN: 4.0000 mg | Freq: Once | INTRAMUSCULAR | Status: DC | PRN

## 2023-12-15 MED ORDER — IOHEXOL 350 MG/ML SOLN
120.0000 mL | Freq: Once | INTRAVENOUS | Status: AC | PRN
  Administered 2023-12-15: 120 mL via INTRAVENOUS

## 2023-12-15 MED ORDER — SUGAMMADEX SODIUM 200 MG/2ML IV SOLN
INTRAVENOUS | Status: DC | PRN
  Administered 2023-12-15: 65 mg via INTRAVENOUS

## 2023-12-15 MED ORDER — ROCURONIUM BROMIDE 10 MG/ML (PF) SYRINGE
PREFILLED_SYRINGE | INTRAVENOUS | Status: DC | PRN
  Administered 2023-12-15: 30 mg via INTRAVENOUS

## 2023-12-15 MED ORDER — ONDANSETRON HCL 4 MG/2ML IJ SOLN
INTRAMUSCULAR | Status: DC | PRN
  Administered 2023-12-15: 4 mg via INTRAVENOUS

## 2023-12-15 MED ORDER — PROPOFOL 10 MG/ML IV BOLUS
INTRAVENOUS | Status: DC | PRN
  Administered 2023-12-15: 60 mg via INTRAVENOUS

## 2023-12-15 MED ORDER — MIDAZOLAM HCL 2 MG/2ML IJ SOLN
INTRAMUSCULAR | Status: AC
Start: 2023-12-15 — End: 2023-12-15
  Filled 2023-12-15: qty 2

## 2023-12-15 MED ORDER — LACTATED RINGERS IV SOLN: INTRAVENOUS | Status: DC

## 2023-12-15 MED ORDER — ORAL CARE MOUTH RINSE: 15.0000 mL | Freq: Once | OROMUCOSAL | Status: DC

## 2023-12-15 MED ORDER — LIDOCAINE 2% (20 MG/ML) 5 ML SYRINGE
INTRAMUSCULAR | Status: DC | PRN
  Administered 2023-12-15: 30 mg via INTRAVENOUS

## 2023-12-15 MED ORDER — MIDAZOLAM HCL 5 MG/5ML IJ SOLN
INTRAMUSCULAR | Status: DC | PRN
Start: 2023-12-15 — End: 2023-12-15
  Administered 2023-12-15: 1 mg via INTRAVENOUS

## 2023-12-15 NOTE — Transfer of Care (Signed)
 Immediate Anesthesia Transfer of Care Note  Patient: Theodore Lewis  Procedure(s) Performed: CT HEMATURIA WORKUP WITH ANESTHESIA  Patient Location: PACU  Anesthesia Type:General  Level of Consciousness: awake and drowsy  Airway & Oxygen  Therapy: Patient Spontanous Breathing and o2 via blow-by  Post-op Assessment: Report given to RN and Post -op Vital signs reviewed and stable  Post vital signs: Reviewed and stable  Last Vitals:  Vitals Value Taken Time  BP 141/112 12/15/23 1120  Temp    Pulse    Resp 21 12/15/23 1123  SpO2    Vitals shown include unfiled device data.  Last Pain:  Vitals:   12/15/23 0759  TempSrc: Axillary         Complications: No notable events documented.

## 2023-12-15 NOTE — Anesthesia Procedure Notes (Signed)
 Procedure Name: Intubation Date/Time: 12/15/2023 10:03 AM  Performed by: Claud Crumb, CRNAPre-anesthesia Checklist: Patient identified, Emergency Drugs available, Suction available and Patient being monitored Patient Re-evaluated:Patient Re-evaluated prior to induction Oxygen  Delivery Method: Circle System Utilized Preoxygenation: Pre-oxygenation with 100% oxygen  Induction Type: IV induction Ventilation: Mask ventilation without difficulty Laryngoscope Size: Glidescope and 3 Grade View: Grade I Tube type: Oral Tube size: 5.5 mm Number of attempts: 1 Airway Equipment and Method: Stylet and Oral airway Placement Confirmation: ETT inserted through vocal cords under direct vision, positive ETCO2 and breath sounds checked- equal and bilateral Secured at: 19 cm Tube secured with: Tape Dental Injury: Teeth and Oropharynx as per pre-operative assessment  Comments: Elective video laryngoscopy.

## 2023-12-15 NOTE — Anesthesia Postprocedure Evaluation (Signed)
 Anesthesia Post Note  Patient: Theodore Lewis  Procedure(s) Performed: CT HEMATURIA WORKUP WITH ANESTHESIA     Patient location during evaluation: PACU Anesthesia Type: General Level of consciousness: awake and alert Pain management: pain level controlled Vital Signs Assessment: post-procedure vital signs reviewed and stable Respiratory status: spontaneous breathing, nonlabored ventilation and respiratory function stable Cardiovascular status: blood pressure returned to baseline and stable Postop Assessment: no apparent nausea or vomiting Anesthetic complications: no  No notable events documented.  Last Vitals:  Vitals:   12/15/23 1230 12/15/23 1300  BP: 118/88   Pulse: 80   Resp: 15   Temp:  36.6 C  SpO2: 100%     Last Pain:  Vitals:   12/15/23 0759  TempSrc: Axillary                 [name],W. EDMOND

## 2023-12-16 ENCOUNTER — Encounter (HOSPITAL_COMMUNITY): Payer: Self-pay | Admitting: Radiology

## 2023-12-19 ENCOUNTER — Telehealth: Payer: Self-pay

## 2023-12-19 ENCOUNTER — Other Ambulatory Visit: Payer: Self-pay | Admitting: Internal Medicine

## 2023-12-19 DIAGNOSIS — K5904 Chronic idiopathic constipation: Secondary | ICD-10-CM

## 2023-12-19 NOTE — Telephone Encounter (Signed)
Copied from CRM [id number]. Topic: Clinical - Lab/Test Results >> Dec 19, 2023  8:57 AM Fredrich Romans wrote: Reason for CRM: Patients mom called in to ask about CT results being in yet.She would like a call once Dr Yetta Barre have looked them over.

## 2023-12-24 NOTE — Telephone Encounter (Signed)
 Patients mom has been advised and she gave a verbal understanding.

## 2023-12-24 NOTE — Telephone Encounter (Signed)
 I see that the CT is in his chart but I don't see any comments. Please advise , his mom is super worried.

## 2023-12-29 ENCOUNTER — Telehealth: Payer: Self-pay | Admitting: Urology

## 2023-12-29 NOTE — Telephone Encounter (Signed)
 Needs a follow up appt. Has had CT. Please schedule for next available.

## 2023-12-31 ENCOUNTER — Encounter: Payer: Self-pay | Admitting: Urology

## 2023-12-31 ENCOUNTER — Ambulatory Visit (INDEPENDENT_AMBULATORY_CARE_PROVIDER_SITE_OTHER): Payer: 59 | Admitting: Urology

## 2023-12-31 DIAGNOSIS — R3129 Other microscopic hematuria: Secondary | ICD-10-CM

## 2023-12-31 NOTE — Progress Notes (Signed)
   Assessment: 1. Other microscopic hematuria     Plan: Today I reviewed the CT scan which shows no worrisome or acute findings.  I also discussed with them recommendation for further evaluation which would involve obtaining urine for hematuria profile and CX bladder detect.  Patient again could not provide specimen today and In-N-Out cath was declined.  Mom would like to take home some urine specimen cups and try to obtain at home and knows to refrigerate them to bring promptly for fresh evaluation.  If this does not work I discussed the possibility of ordering a condom catheter for him to come to the office for placement and collection.  Chief Complaint: Blood in urine  HPI: Theodore Lewis is a 28 y.o. male who presents for continued evaluation of microhematuria. See my note 11/12/2023 at time of initial visit for detailed history. CT heme protocol 12/2023 done under anesthesia neg--reviewed with mom.   Portions of the above documentation were copied from a prior visit for review purposes only.  Allergies: Allergies  Allergen Reactions   Penicillins Other (See Comments)    Right side of body started shaking     Vancomycin Rash    PMH: Past Medical History:  Diagnosis Date   Anxiety    Cerebral palsy (HCC)    Congenital quadriparesis (HCC)    Neuromuscular disorder (HCC)    Seizure (HCC)    Seizures (HCC)    last seizure 3 yrs ago    PSH: Past Surgical History:  Procedure Laterality Date   BRAIN SURGERY     Shunt placed when he was a yr. old   I & D EXTREMITY Bilateral 04/29/2014   Procedure: IRRIGATION AND DEBRIDEMENT EXTREMITY;  Surgeon: Eldred Manges, MD;  Location: [facility] OR;  Service: Orthopedics;  Laterality: Bilateral;  Right and Left Hamstring Lenghening and Fiberglass Casting   LEG TENDON SURGERY Bilateral approx 6 yrs ago   RADIOLOGY WITH ANESTHESIA N/A 12/15/2023   Procedure: CT HEMATURIA WORKUP WITH ANESTHESIA;  Surgeon: Radiologist, Medication, MD;  Location: [facility]  OR;  Service: Radiology;  Laterality: N/A;    SH: Social History   Tobacco Use   Smoking status: Never    Passive exposure: Never   Smokeless tobacco: Never  Vaping Use   Vaping status: Never Used  Substance Use Topics   Alcohol use: No   Drug use: No    ROS: Constitutional:  Negative for fever, chills, weight loss CV: Negative for chest pain, previous MI, hypertension Respiratory:  Negative for shortness of breath, wheezing, sleep apnea, frequent cough GI:  Negative for nausea, vomiting, bloody stool, GERD   Results: No results found for this or any previous visit (from the past 24 hours).

## 2024-01-05 ENCOUNTER — Other Ambulatory Visit: Payer: Self-pay

## 2024-01-05 ENCOUNTER — Other Ambulatory Visit

## 2024-01-05 DIAGNOSIS — R3129 Other microscopic hematuria: Secondary | ICD-10-CM

## 2024-01-05 DIAGNOSIS — N219 Calculus of lower urinary tract, unspecified: Secondary | ICD-10-CM | POA: Diagnosis not present

## 2024-01-05 LAB — URINALYSIS
Bilirubin, UA: NEGATIVE
Glucose, UA: NEGATIVE
Ketones, UA: NEGATIVE
Leukocytes,UA: NEGATIVE
Nitrite, UA: NEGATIVE
Protein,UA: NEGATIVE
RBC, UA: NEGATIVE
Specific Gravity, UA: 1.02 (ref 1.005–1.030)
Urobilinogen, Ur: 0.2 mg/dL (ref 0.2–1.0)
pH, UA: 7.5 (ref 5.0–7.5)

## 2024-01-06 ENCOUNTER — Telehealth: Payer: Self-pay | Admitting: Urology

## 2024-01-06 NOTE — Telephone Encounter (Signed)
 Bill from [employer] needs to speak with a nurse regarding this patient.  [phone]

## 2024-01-06 NOTE — Telephone Encounter (Signed)
 Spoke with Theodore Lewis at [facility], he is going to fax over a form to complete so they can process the specimen.

## 2024-01-13 ENCOUNTER — Encounter: Payer: Self-pay | Admitting: Urology

## 2024-01-14 ENCOUNTER — Telehealth: Payer: Self-pay

## 2024-01-14 NOTE — Telephone Encounter (Signed)
Left message for a return call regarding lab results.

## 2024-01-14 NOTE — Telephone Encounter (Signed)
-----   Message from Theodore Lewis sent at 01/14/2024 12:18 PM EDT ----- Please call mom.  Let her know that the cytology show no abnl cells. Arrange FU in 3-4 months ----- Message ----- From: Claudie Leach Sent: 01/13/2024   9:44 AM EDT To: Theodore Maxcy, MD

## 2024-01-15 NOTE — Telephone Encounter (Signed)
 Called the cell phone number and the VM was full; unable to leave message.

## 2024-01-16 ENCOUNTER — Telehealth: Payer: Self-pay | Admitting: Urology

## 2024-01-16 NOTE — Telephone Encounter (Signed)
 Spoke with mom, she is aware of the results and the requested follow up. Mom was on the way to an appt and could not make appt with me today.   Please call mom to schedule 3-4 month follow up with [facility].

## 2024-01-16 NOTE — Telephone Encounter (Signed)
 Called all numbers on file. Was able to leave a voicemail on the Home number. Unable to on the mothers phone number. LVM for pt mother to call and get scheduled for a 3-4 month follow up with [facility].

## 2024-01-22 ENCOUNTER — Telehealth: Payer: Self-pay

## 2024-01-22 ENCOUNTER — Other Ambulatory Visit: Payer: Self-pay | Admitting: Internal Medicine

## 2024-01-22 DIAGNOSIS — J301 Allergic rhinitis due to pollen: Secondary | ICD-10-CM

## 2024-01-22 NOTE — Telephone Encounter (Signed)
 Patients mom has been made aware and gave a verbal understanding.

## 2024-01-22 NOTE — Telephone Encounter (Signed)
 Copied from CRM [id number]. Topic: Clinical - Medical Advice >> Jan 22, 2024  9:10 AM [name] wrote: Reason for CRM: Pts mom wants to know if the pt will need to have blood work done for his surgical clearance, she is concerned the labs won't be back in time for his procedure appointment on 04/11. The pt is scheduled on 04/08 with his pcp, for surgical clearance which was the next available appointment . Please advise

## 2024-01-22 NOTE — Telephone Encounter (Signed)
 Copied from CRM [id number]. Topic: Clinical - Medication Refill >> Jan 22, 2024  9:05 AM Bo Mcclintock wrote: Most Recent Primary Care Visit:  Provider: Etta Grandchild  Department: [facility]  Visit Type: PHYSICAL  Date: 11/24/2023  Medication: cetirizine HCl (ZYRTEC) 5 MG/5ML SOLN   Has the patient contacted their pharmacy? Yes (Agent: If no, request that the patient contact the pharmacy for the refill. If patient does not wish to contact the pharmacy document the reason why and proceed with request.) (Agent: If yes, when and what did the pharmacy advise?) Pharmacy does not have a prescription on file   Is this the correct pharmacy for this prescription? Yes If no, delete pharmacy and type the correct one.  This is the patient's preferred pharmacy:  [id number] [place], [place] - [phone] AT [facility] OF [facility] & [address] [place] [address] Phone: [phone] Fax: [phone]   Has the prescription been filled recently? No  Is the patient out of the medication? Yes  Has the patient been seen for an appointment in the last year OR does the patient have an upcoming appointment? Yes  Can we respond through MyChart? No  Agent: Please be advised that Rx refills may take up to 3 business days. We ask that you follow-up with your pharmacy.

## 2024-01-23 MED ORDER — CETIRIZINE HCL 5 MG/5ML PO SOLN: 10.0000 mg | Freq: Every day | ORAL | 0 refills | Status: DC

## 2024-01-27 NOTE — Telephone Encounter (Signed)
 Paperwork has been sat on Dr. Yetta Barre desk.

## 2024-01-27 NOTE — Telephone Encounter (Signed)
 Pt's mother has dropped off the form for the dental procedure and it has been placed in pcp's box.   Please fax to [phone] no later than 4.1.25

## 2024-01-28 ENCOUNTER — Other Ambulatory Visit: Payer: Self-pay | Admitting: Internal Medicine

## 2024-01-28 NOTE — Telephone Encounter (Signed)
 Has been completed and faxed. Patients mom has been made aware.

## 2024-02-10 ENCOUNTER — Ambulatory Visit: Admitting: Internal Medicine

## 2024-02-10 ENCOUNTER — Encounter: Payer: Self-pay | Admitting: Internal Medicine

## 2024-02-10 VITALS — BP 116/78 | HR 71 | Temp 98.5°F | Resp 16 | Ht <= 58 in | Wt <= 1120 oz

## 2024-02-10 DIAGNOSIS — G40309 Generalized idiopathic epilepsy and epileptic syndromes, not intractable, without status epilepticus: Secondary | ICD-10-CM | POA: Diagnosis not present

## 2024-02-10 DIAGNOSIS — R64 Cachexia: Secondary | ICD-10-CM

## 2024-02-10 DIAGNOSIS — D511 Vitamin B12 deficiency anemia due to selective vitamin B12 malabsorption with proteinuria: Secondary | ICD-10-CM

## 2024-02-10 DIAGNOSIS — D696 Thrombocytopenia, unspecified: Secondary | ICD-10-CM

## 2024-02-10 DIAGNOSIS — R3129 Other microscopic hematuria: Secondary | ICD-10-CM

## 2024-02-10 DIAGNOSIS — Z5181 Encounter for therapeutic drug level monitoring: Secondary | ICD-10-CM

## 2024-02-10 DIAGNOSIS — E519 Thiamine deficiency, unspecified: Secondary | ICD-10-CM

## 2024-02-10 LAB — URINALYSIS, ROUTINE W REFLEX MICROSCOPIC
Bilirubin Urine: NEGATIVE
Hgb urine dipstick: NEGATIVE
Leukocytes,Ua: NEGATIVE
Nitrite: NEGATIVE
RBC / HPF: NONE SEEN (ref 0–?)
Specific Gravity, Urine: 1.02 (ref 1.000–1.030)
Total Protein, Urine: NEGATIVE
Urine Glucose: NEGATIVE
Urobilinogen, UA: 0.2 (ref 0.0–1.0)
pH: 6 (ref 5.0–8.0)

## 2024-02-10 LAB — BASIC METABOLIC PANEL WITH GFR
BUN: 14 mg/dL (ref 6–23)
CO2: 27 meq/L (ref 19–32)
Calcium: 9.8 mg/dL (ref 8.4–10.5)
Chloride: 101 meq/L (ref 96–112)
Creatinine, Ser: 0.63 mg/dL (ref 0.40–1.50)
GFR: 129.79 mL/min (ref >60.00)
Glucose, Bld: 72 mg/dL (ref 70–99)
Potassium: 3.8 meq/L (ref 3.5–5.1)
Sodium: 142 meq/L (ref 135–145)

## 2024-02-10 LAB — CBC WITH DIFFERENTIAL/PLATELET
Basophils Absolute: 0 10*3/uL (ref 0.0–0.1)
Basophils Relative: 0.7 % (ref 0.0–3.0)
Eosinophils Absolute: 0 10*3/uL (ref 0.0–0.7)
Eosinophils Relative: 0.8 % (ref 0.0–5.0)
HCT: 47.9 % (ref 39.0–52.0)
Hemoglobin: 16.2 g/dL (ref 13.0–17.0)
Lymphocytes Relative: 26 % (ref 12.0–46.0)
Lymphs Abs: 1.2 10*3/uL (ref 0.7–4.0)
MCHC: 33.8 g/dL (ref 30.0–36.0)
MCV: 97.4 fl (ref 78.0–100.0)
Monocytes Absolute: 0.5 10*3/uL (ref 0.1–1.0)
Monocytes Relative: 10 % (ref 3.0–12.0)
Neutro Abs: 2.8 10*3/uL (ref 1.4–7.7)
Neutrophils Relative %: 62.5 % (ref 43.0–77.0)
Platelets: 109 10*3/uL — ABNORMAL LOW (ref 150.0–400.0)
RBC: 4.92 Mil/uL (ref 4.22–5.81)
RDW: 13.6 % (ref 11.5–15.5)
WBC: 4.5 10*3/uL (ref 4.0–10.5)

## 2024-02-10 LAB — FOLATE: Folate: 9.2 ng/mL (ref >5.9)

## 2024-02-10 LAB — VITAMIN B12: Vitamin B-12: 215 pg/mL (ref 211–911)

## 2024-02-10 NOTE — Progress Notes (Signed)
 Subjective:  Patient ID: Theodore Lewis, male    DOB: 03/16/1996  Age: 28 y.o. MRN: [id number]  CC: Follow-up   HPI Theodore Lewis presents for f/up ----  Discussed the use of AI scribe software for clinical note transcription with the patient, who gave verbal consent to proceed.  History of Present Illness   Theodore Lewis is a 28 year old male who presents for a follow-up regarding his upcoming dental surgery and nutritional concerns. He is accompanied by his mother, who is actively involved in his care.  He is preparing for dental surgery, and there is a concern about his platelet count. No bleeding or bruising is reported.  He has lost approximately seven to eight pounds since January, despite having a good appetite. He frequently requests food, particularly chicken nuggets, french fries, and chips, but there is a concern about his protein intake. His caregiver is attempting to introduce more protein-rich foods like milk, chicken, and fish into his diet.  He is on carbamazepine and Depakote for seizure management. His caregiver recalls that blood levels of these medications were monitored in the past, but it has not been done recently. There is a question about whether his carbamazepine levels have been checked recently.       Outpatient Medications Prior to Visit  Medication Sig Dispense Refill   carbamazepine (CARBATROL) 300 MG 12 hr capsule Take 1 capsule (300 mg total) by mouth 2 (two) times daily. 180 capsule 3   cetirizine HCl (ZYRTEC) 5 MG/5ML SOLN Take 10 mLs (10 mg total) by mouth daily. 473 mL 0   diazepam (DIASTAT ACUDIAL) 10 MG GEL INSERT 10 MG RECTALLY AT ONSET OF SEIZURE 3 each 0   divalproex (DEPAKOTE SPRINKLES) 125 MG capsule TAKE 5 CAPSULE BY MOUTH TWICE DAILY 310 capsule 3   ketoconazole (NIZORAL) 2 % cream Apply 1 Application topically 2 (two) times daily as needed for irritation. (Patient not taking: Reported on 02/12/2024) 60 g 2   LINZESS 72 MCG capsule  TAKE 1 CAPSULE(72 MCG) BY MOUTH DAILY BEFORE BREAKFAST 90 capsule 0   diazePAM 5 MG/5ML SOLN Take 1 mL (1 mg total) by mouth every morning AND 1 mL (1 mg total) daily after lunch AND 2 mLs (2 mg total) at bedtime. 120 mL 5   famotidine (PEPCID) 40 MG/5ML suspension SHAKE LIQUID AND TAKE 5 ML(40 MG) BY MOUTH TWICE DAILY 300 mL 2   Nutritional Supplements (DUOCAL) POWD Give 1 scoop into food or liquid 4 times per day     Nutritional Supplements (NUTRITIONAL SUPPLEMENT PLUS) LIQD 1 Ensure Clear or Boost Breeze given PO daily. 7347 mL 12   ondansetron (ZOFRAN) 4 MG/5ML solution Take 5 mLs (4 mg total) by mouth every 8 (eight) hours as needed for nausea or vomiting. 50 mL 0   No facility-administered medications prior to visit.    ROS Review of Systems  All other systems reviewed and are negative.   Objective:  BP 116/78 (BP Location: Right Arm, Patient Position: Sitting, Cuff Size: Small)   Pulse 71   Temp 98.5 F (36.9 C) (Temporal)   Resp 16   Ht 4' (1.219 m)   Wt 62 lb 12.8 oz (28.5 kg)   SpO2 95%   BMI 19.16 kg/m   BP Readings from Last 3 Encounters:  02/12/24 110/70  02/10/24 116/78  12/15/23 118/88    Wt Readings from Last 3 Encounters:  02/12/24 76 lb (34.5 kg)  02/10/24 62 lb 12.8 oz (  28.5 kg)  12/15/23 72 lb (32.7 kg)    Physical Exam Vitals reviewed.  Constitutional:      General: He is not in acute distress.    Appearance: He is ill-appearing. He is not toxic-appearing or diaphoretic.  HENT:     Nose: Nose normal.     Mouth/Throat:     Mouth: Mucous membranes are moist.  Eyes:     General: No scleral icterus.    Conjunctiva/sclera: Conjunctivae normal.  Cardiovascular:     Rate and Rhythm: Normal rate and regular rhythm.     Heart sounds: No murmur heard. Pulmonary:     Effort: Pulmonary effort is normal.     Breath sounds: No stridor. No wheezing, rhonchi or rales.  Abdominal:     General: Abdomen is flat.     Palpations: There is no mass.      Tenderness: There is no abdominal tenderness. There is no guarding.     Hernia: No hernia is present.  Musculoskeletal:        General: Normal range of motion.     Cervical back: Neck supple.     Right lower leg: No edema.     Left lower leg: No edema.  Lymphadenopathy:     Cervical: No cervical adenopathy.  Skin:    General: Skin is warm.  Neurological:     Mental Status: He is alert. Mental status is at baseline.  Psychiatric:        Mood and Affect: Mood normal.        Behavior: Behavior normal.     Lab Results  Component Value Date   WBC 4.5 02/10/2024   HGB 16.2 02/10/2024   HCT 47.9 02/10/2024   PLT 109.0 (L) 02/10/2024   GLUCOSE 72 02/10/2024   ALT 28 10/28/2023   AST 22 10/28/2023   NA 142 02/10/2024   K 3.8 02/10/2024   CL 101 02/10/2024   CREATININE 0.63 02/10/2024   BUN 14 02/10/2024   CO2 27 02/10/2024   TSH 3.16 11/21/2022    CT HEMATURIA WORKUP Result Date: 12/25/2023 CLINICAL DATA:  Microscopic hematuria. EXAM: CT ABDOMEN AND PELVIS WITHOUT AND WITH CONTRAST TECHNIQUE: Multidetector CT imaging of the abdomen and pelvis was performed following the standard protocol before and following the bolus administration of intravenous contrast. RADIATION DOSE REDUCTION: This exam was performed according to the departmental dose-optimization program which includes automated exposure control, adjustment of the mA and/or kV according to patient size and/or use of iterative reconstruction technique. CONTRAST:  OMNIPAQUE IOHEXOL 350 MG/ML SOLN COMPARISON:  05/23/2017 FINDINGS: Lower Chest: No acute findings. Hepatobiliary: No suspicious hepatic masses identified. 1 cm calcified gallstone again seen. No evidence of cholecystitis or biliary ductal dilatation. Pancreas:  No mass or inflammatory changes. Spleen: Within normal limits in size and appearance. Adrenals/Urinary Tract: No adrenal masses identified. A few punctate renal calculi are seen bilaterally. No evidence of  ureteral calculi or hydronephrosis. No suspicious renal masses identified. No masses seen involving the collecting systems, ureters, or bladder. Stomach/Bowel: No evidence of obstruction, inflammatory process or abnormal fluid collections. Vascular/Lymphatic: No pathologically enlarged lymph nodes. No acute vascular findings. Reproductive:  No mass or other significant abnormality. Other: VP shunt tubing is seen within the abdomen. No ascites or abnormal fluid collections identified. Musculoskeletal: No suspicious bone lesions identified. Chronic right hip dislocation and dysplasia again noted. IMPRESSION: Punctate bilateral renal calculi. No evidence of ureteral calculi, hydronephrosis, or other acute findings. No radiographic evidence of urinary tract  neoplasm. Cholelithiasis. No radiographic evidence of cholecystitis. Electronically Signed   By: Marlyce Sine M.D.   On: 12/25/2023 17:04    Assessment & Plan:   Thrombocytopenia (HCC)- PLTs are stable. No B/B. -     Vitamin B12; Future -     CBC with Differential/Platelet; Future -     Vitamin B1; Future -     Folate; Future -     Zinc; Future  Cachexia (HCC) -     Vitamin B12; Future -     CBC with Differential/Platelet; Future -     Vitamin B1; Future -     Folate; Future -     Zinc; Future  Other microscopic hematuria -     Urinalysis, Routine w reflex microscopic; Future -     Basic metabolic panel with GFR; Future  Generalized convulsive epilepsy (HCC) -     Valproic acid level; Future -     Carbamazepine level, total; Future  Therapeutic drug monitoring -     Valproic acid level; Future -     Carbamazepine level, total; Future  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  Thiamine deficiency -     Vitamin B-1; Take 1 tablet (50 mg total) by mouth daily.  Dispense: 90 tablet; Refill: 0     Follow-up: Return in about 4 months (around 06/11/2024).  Sandra Crouch, MD

## 2024-02-10 NOTE — Patient Instructions (Signed)
 Thrombocytopenia Thrombocytopenia means that you have a low number of platelets in your blood. Platelets are tiny cells in the blood. When you bleed, they clump together at the cut or injury to stop the bleeding. This is called blood clotting. If you do not have enough platelets, your blood may have trouble clotting. This may cause you to bleed and bruise very easily. What are the causes? This condition is caused by a low number of platelets in your blood. There are three main reasons for this: Your body not making enough platelets. This may be caused by: Bone marrow diseases. Disorders that are passed from parent to child (inherited). Certain cancer medicines or treatments. Infection from germs (bacteria or viruses). Alcoholism. Platelets not being released in the blood. This can be caused by: Having a spleen that is larger than normal. A condition called Gaucher disease. Your body destroying platelets too quickly. This may be caused by: Certain autoimmune diseases. Some medicines that thin your blood. Certain blood clotting disorders. Certain bleeding disorders. Exposure to harmful (toxic) chemicals. Pregnancy. What are the signs or symptoms? Bruising easily. Bleeding from the nose or mouth. Heavy menstrual periods. Blood in the pee (urine), poop (stool), or vomit. A purple-red color to the skin (purpura). A rash that looks like pinpoint, purple-red spots (petechiae) on the lower legs. How is this treated? Treatment depends on the cause. Treatment may include: Treatment of another condition that is causing the low platelet count. Medicines to help protect your platelets from being destroyed. A replacement (transfusion) of platelets to stop or prevent bleeding. Surgery to take out the spleen. Follow these instructions at home: Medicines Take over-the-counter and prescription medicines only as told by your doctor. Do not take any medicines that have aspirin or NSAIDs, such as  ibuprofen. Activity Avoid doing things that could hurt or bruise you. Take action to prevent falls. Do not play contact sports. Ask your doctor what activities are safe for you. Take care not to burn yourself: When you use an iron. When you cook. Take care not to cut yourself: When you shave. When you use scissors, needles, knives, or other tools. General instructions  Check your skin and the inside of your mouth for bruises or blood as told by your doctor. Wear a medical alert bracelet that says that you have a bleeding disorder. Check to see if there is blood in your pee and poop. Do this as told by your doctor. Do not drink alcohol. If you do drink, limit the amount that you drink. Stay away from harmful (toxic) chemicals. Tell all of your doctors that you have this condition. Be sure to tell your dentist and eye doctor. Tell your dentist about your condition before you have your teeth cleaned. Keep all follow-up visits. Contact a doctor if: You have bruises and you do not know why. You have new symptoms. You have symptoms that get worse. You have a fever. Get help right away if: You have very bad bleeding anywhere on your body. You have blood in your vomit, pee, or poop. You have an injury to your head. You have a sudden, very bad headache. Summary Thrombocytopenia means that you have a low number of platelets in your blood. Platelets stick together to form a clot. Symptoms of this condition include getting bruises easily, bleeding from the mouth and nose, a purple-red color to the skin, and a rash. Take care not to cut or burn yourself. This information is not intended to replace advice given  to you by your health care provider. Make sure you discuss any questions you have with your health care provider. Document Revised: 04/05/2021 Document Reviewed: 04/05/2021 Elsevier Patient Education  2024 [employer].

## 2024-02-12 ENCOUNTER — Ambulatory Visit (INDEPENDENT_AMBULATORY_CARE_PROVIDER_SITE_OTHER): Payer: Self-pay | Admitting: Family

## 2024-02-12 ENCOUNTER — Encounter (INDEPENDENT_AMBULATORY_CARE_PROVIDER_SITE_OTHER): Payer: Self-pay | Admitting: Family

## 2024-02-12 ENCOUNTER — Ambulatory Visit (INDEPENDENT_AMBULATORY_CARE_PROVIDER_SITE_OTHER): Payer: Self-pay | Admitting: Dietician

## 2024-02-12 VITALS — BP 110/70 | Wt 76.0 lb

## 2024-02-12 DIAGNOSIS — G40309 Generalized idiopathic epilepsy and epileptic syndromes, not intractable, without status epilepticus: Secondary | ICD-10-CM

## 2024-02-12 DIAGNOSIS — R451 Restlessness and agitation: Secondary | ICD-10-CM | POA: Diagnosis not present

## 2024-02-12 DIAGNOSIS — F71 Moderate intellectual disabilities: Secondary | ICD-10-CM

## 2024-02-12 DIAGNOSIS — Z6823 Body mass index (BMI) 23.0-23.9, adult: Secondary | ICD-10-CM | POA: Diagnosis not present

## 2024-02-12 DIAGNOSIS — R6339 Other feeding difficulties: Secondary | ICD-10-CM | POA: Diagnosis not present

## 2024-02-12 DIAGNOSIS — G808 Other cerebral palsy: Secondary | ICD-10-CM

## 2024-02-12 DIAGNOSIS — R636 Underweight: Secondary | ICD-10-CM | POA: Diagnosis not present

## 2024-02-12 DIAGNOSIS — G40209 Localization-related (focal) (partial) symptomatic epilepsy and epileptic syndromes with complex partial seizures, not intractable, without status epilepticus: Secondary | ICD-10-CM

## 2024-02-12 DIAGNOSIS — M6281 Muscle weakness (generalized): Secondary | ICD-10-CM

## 2024-02-12 DIAGNOSIS — G4700 Insomnia, unspecified: Secondary | ICD-10-CM

## 2024-02-12 DIAGNOSIS — E46 Unspecified protein-calorie malnutrition: Secondary | ICD-10-CM

## 2024-02-12 NOTE — Patient Instructions (Signed)
 It was a pleasure to see you today!  Instructions for you until your next appointment are as follows: Return in a week or so to get blood drawn. Remember to wait to give the medication until after the blood has been drawn Check with [facility] about how to get a night time caregiver for Baltazar so you can get some rest Work on giving a scoop of Duocal every day as you can I agree with Physical Therapy if insurance will cover it Be sure to go back to his PCP to get his B12 injection Please sign up for MyChart if you have not done so. Please plan to return for follow up in 6 months or sooner if needed.  Feel free to contact our office during normal business hours at [phone] with questions or concerns. If there is no answer or the call is outside business hours, please leave a message and our clinic staff will call you back within the next business day.  If you have an urgent concern, please stay on the line for our after-hours answering service and ask for the on-call neurologist.     I also encourage you to use MyChart to communicate with me more directly. If you have not yet signed up for MyChart within [facility], the front desk staff can help you. However, please note that this inbox is NOT monitored on nights or weekends, and response can take up to 2 business days.  Urgent matters should be discussed with the on-call pediatric neurologist.   At [facility], we are committed to providing exceptional care. You will receive a patient satisfaction survey through text or email regarding your visit today. Your opinion is important to me. Comments are appreciated.

## 2024-02-12 NOTE — Progress Notes (Unsigned)
 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 This was treated with VP shunt. He has resultant spastic quadriparesis, contractures, poor vision, evidence of an axial collection of mixed type in the right frontal-temporal-parietal region and possible infarction of the right brain with encephalomalacia of the left brain, as well as intermittent episode of agitation. He is taking and tolerating Depakote and Carbatrol for his seizure disorder. He is cared for at home by his parents, but his father has significant health problems that is limiting his ability to provide physical care    Due to his medical condition, he is indefinitely incontinent of stool and urine.  It is medically necessary for him to use diapers, underpads, and gloves to assist with hygiene and skin integrity.   Today's concerns: Parents report today that Theodore Lewis was seen by PCP earlier this week and that his weight was 62 lbs. I am concerned that is inaccurate because he weighs 76 lbs today. Mom also feels that Theodore Lewis has gained some weight since his last visit with me.  Mom notes that Theodore Lewis had seizure drug levels drawn at PCP office a couple of hours after she gave the doses. The results are pending at the time of this visit. She also notes that she was told that he has low B12 and had questions about that.  Theodore Lewis has a dental appointment in [place] tomorrow.  Mom has contacted [facility] about PT services for Theodore Lewis as she feels that he is weaker and needs  exercises for strengthening Mom admits to being very tired and sleep deprived because Theodore Lewis stays awake a long time at night and then awakens early. She is his primary caregiver because Dad is disabled.   Theodore Lewis has been otherwise generally healthy since he was last seen. No health concerns today other than previously mentioned.  Review of systems: Please see HPI for neurologic and other pertinent review of systems. Otherwise all other systems were reviewed and were negative.  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 02/10/2024   Encounter for general adult medical examination with abnormal findings 11/26/2023   Other microscopic hematuria 11/06/2023   Chronic idiopathic constipation 09/12/2023   Tinea cruris 09/12/2023   Flu vaccine need 08/21/2023   Oral aversion 07/04/2023   Insomnia 07/04/2023   Abnormal barium swallow 11/14/2022   Increased nutritional needs 11/14/2022   Dysphagia 08/09/2022   Protein-calorie malnutrition (HCC) 08/09/2022   Picky eater 08/09/2022   Decreased motor strength 11/12/2021   Thrombocytopenia (HCC) 08/02/2021   Seasonal allergic rhinitis due to pollen 08/02/2021   Cachexia (HCC) 08/02/2021   Laryngopharyngeal reflux (LPR) 04/05/2021   Incontinence of feces 11/15/2020   Oppositional behavior 09/22/2020   VP (ventriculoperitoneal) shunt status 08/30/2015   Restlessness and agitation 04/20/2015   Moderate intellectual disabilities 02/24/2013   History of congenital brain abnormality 02/24/2013   Obstructive hydrocephalus (HCC) 02/24/2013   Congenital quadriplegia (HCC) 02/24/2013   Generalized convulsive epilepsy (  HCC) 02/24/2013   Partial epilepsy with impairment of consciousness (HCC) 02/24/2013     Past Medical History:  Diagnosis Date   Anxiety    Cerebral palsy (HCC)    Congenital quadriparesis (HCC)     Laryngopharyngeal reflux (LPR) 04/05/2021   Neuromuscular disorder (HCC)    Seizure (HCC)    Seizures (HCC)    last seizure 3 yrs ago    Past medical history comments: See HPI Copied from previous record: He presented at few months of age with massive hydrocephalus.  He required urgent placement of a ventriculoperitoneal shunt.  He has experienced revisions in the past.  Brain images in 2007 showed an extra-axial collection of mixed type in the right frontotemporal parietal region with possible infarction in the right brain and significant encephalomalacia of the left brain.  Ventricles were well decompressed.   A CT scan of the brain and shunt series Mar 04, 2013 showed microcephaly with marked diffuse thickening of the calvarium and marked enlargement of the mastoid sinus bilaterally. Left frontal shunt catheter extends to the region of the third ventricle and is unchanged. No hydrocephalus. There is cerebellar atrophy bilaterally. 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 Brainstem is small. There is atrophy of the left occipital lobe. Agenesis of the corpus callosum.   Large extra-axial fluid collection on the right measures 4.6 x 11.1 cm. This has low density centrally and the wall has calcified since the prior study. The fluid collection has matured and become better defined since the prior study. There is some mass effect on the right cerebral hemisphere. No midline shift to the left. No acute hemorrhage. Shunt tubing was intact from the ventricles to the abdomen.  Surgical history: Past Surgical History:  Procedure Laterality Date   BRAIN SURGERY     Shunt placed when he was a yr. old   I & D EXTREMITY Bilateral 04/29/2014   Procedure: IRRIGATION AND DEBRIDEMENT EXTREMITY;  Surgeon: Eldred Manges, MD;  Location: [facility] OR;  Service: Orthopedics;  Laterality: Bilateral;  Right and Left Hamstring Lenghening and Fiberglass Casting   LEG TENDON SURGERY Bilateral approx  6 yrs ago   RADIOLOGY WITH ANESTHESIA N/A 12/15/2023   Procedure: CT HEMATURIA WORKUP WITH ANESTHESIA;  Surgeon: Radiologist, Medication, MD;  Location: [facility] OR;  Service: Radiology;  Laterality: N/A;     Family history: family history includes Cancer in his paternal grandfather and paternal grandmother; Diabetes in his father and mother; Early death in his paternal grandfather and paternal grandmother; Healthy in his mother; Heart disease in his father, maternal grandfather, and mother; Hyperlipidemia in his father, maternal grandfather, and mother; Hypertension in his father, maternal grandfather, and maternal grandmother; Intellectual disability in his maternal grandfather; Learning disabilities in his father; Stomach cancer in his paternal grandfather and paternal grandmother; Throat cancer in his paternal grandfather.   Social history: Social History   Socioeconomic History   Marital status: Single    Spouse name: Not on file   Number of children: 0   Years of education: Not on file   Highest education level: GED or equivalent  Occupational History   Occupation: disbale  Tobacco Use   Smoking status: Never    Passive exposure: Never   Smokeless tobacco: Never  Vaping Use   Vaping status: Never Used  Substance and Sexual Activity   Alcohol use: No   Drug use: No   Sexual activity: Never  Other Topics Concern   Not on file  Social History Narrative   Theodore Lewis live at home with mom and dad.   He enjoys going outside, and riding.    Social Drivers of Corporate investment banker Strain: Low Risk  (02/09/2024)   Overall Financial Resource Strain (CARDIA)    Difficulty of Paying Living Expenses: Not hard at all  Food Insecurity: No Food Insecurity (02/09/2024)   Hunger Vital Sign    Worried About Running Out of Food in the Last Year: Never true    Ran Out of Food in the Last Year: Never true  Transportation Needs: Unmet Transportation Needs (02/09/2024)   PRAPARE - Therapist, art (Medical): Yes    Lack of Transportation (Non-Medical): No  Physical Activity: Inactive (02/09/2024)   Exercise Vital Sign    Days of Exercise per Week: 0 days    Minutes of Exercise per Session: 0 min  Stress: Patient Declined (02/09/2024)   [employer] of Occupational Health - Occupational Stress Questionnaire    Feeling of Stress : Patient declined  Social Connections: Unknown (02/09/2024)   Social Connection and Isolation Panel [NHANES]    Frequency of Communication with Friends and Family: Patient declined    Frequency of Social Gatherings with Friends and Family: Twice a week    Attends Religious Services: Patient declined    Database administrator or Organizations: Patient declined    Attends Banker Meetings: Patient unable to answer    Marital Status: Never married  Intimate Partner Violence: Not At Risk (06/17/2023)   Humiliation, Afraid, Rape, and Kick questionnaire    Fear of Current or Ex-Partner: No    Emotionally Abused: No    Physically Abused: No    Sexually Abused: No    Past/failed meds:  Allergies: Allergies  Allergen Reactions   Penicillins Other (See Comments)    Right side of body started shaking     Vancomycin Rash    Immunizations: Immunization History  Administered Date(s) Administered   Fluzone Influenza virus vaccine,trivalent (IIV3), split virus 08/30/2015, 07/02/2016   Influenza Inj Mdck Quad With Preservative 07/17/2022   Influenza, Quadrivalent, Recombinant, Inj, Pf 07/24/2021   Influenza,inj,Quad PF,6+ Mos 07/08/2019   Influenza-Unspecified 07/24/2021, 09/05/2023   PFIZER(Purple Top)SARS-COV-2 Vaccination 03/13/2020, 04/04/2020, 10/05/2020   Pfizer(Comirnaty)Fall Seasonal Vaccine 12 years and older 08/14/2022    Diagnostics/Screenings: Copied from previous record: 08/22/2022 - Barium Swallow - Limited study as pt was apprehensive to swallow the barium *despite it being sweetened with artificial  sweetener* and several options provided.  In addition, suboptimal view due to pt leaning forward out of flouro view. Patient's mother had to help feed Theodore Lewis - as he would only accept *reluctantly* intake from her.  Mild oral dysphagia most c/b impaired oral control resulting in premature spillage of boluses into pharynx and lack of adequate mastication.  Pt did not adequate masticate with sweet potato despite mom rubbing his chin in effort to induce mastication. Minimal vertical mastication observed with pt and prolonged lingual press to palate to transit bolus into pharynx it- consistent with intellectual disability. Pudding bolus rapidly spilled (dumped) into pharynx with swallow triggering at vallecular space.  SLP had pt use his bottle/straw during the test - with him accepting only small sips but adequate airway protection.  Premature spillage to pyriform sinus with thin observed prior to swallow trigger.     Pharyngeal swallow was strong without any retention fortunately. Theodore Lewis did not cough or gag during po trials of MBS - but  he only accepted approx. 7 boluses total.       Upon esophageal sweep, pt noted to be kyphotic - which raises concerns for potential multifactorial dysphagia including esophageal issues or GI issues contributing to gagging.  Please see video loops of esophageal sweep.       03/12/2021 - CT head wo contrast - 1. Outside of the chest, a second new discontinuity of the shunt tubing is identified overlying the left mastoid. 2. But there is no ventriculomegaly. And the non contrast CT appearance of the brain is stable since 2018.   05/23/2017 - CT head wo contrast - No acute finding. Chronic microcephaly and brain atrophy. Chronic calcified subdural collection on the right, not significantly changed since 03/04/2013. No evidence of shunt malfunction. Sinuses clear  Physical Exam: BP 110/70 (BP Location: Right Arm, Patient Position: Sitting, Cuff Size: Normal)   Wt 76 lb (34.5  kg)   BMI 23.19 kg/m   Wt Readings from Last 3 Encounters:  02/12/24 76 lb (34.5 kg)  02/10/24 62 lb 12.8 oz (28.5 kg)  12/15/23 72 lb (32.7 kg)  General: thin but well developed, well nourished young man, seated in wheelchair, in no evident distress Head: normocephalic and atraumatic. Oropharynx difficult to examine but appears benign. No dysmorphic features. Neck: supple Cardiovascular: regular rate and rhythm, no murmurs. Respiratory: clear to auscultation bilaterally Abdomen: bowel sounds present all four quadrants, abdomen soft, non-tender, non-distended. No hepatosplenomegaly or masses palpated. Musculoskeletal: no skeletal deformities or obvious scoliosis. Has muscle wasting and flexion contractures Skin: no rashes or neurocutaneous lesions  Neurologic Exam Mental Status: awake and fully alert. Has no language.  Occasional signs "eat" to his mother. Holds a binder clip in his hand and gets upset if it is removed. Resistant to invasions into his space. Unable to follow instructions or participate in examination Cranial Nerves: fundoscopic exam - red reflex present.  Unable to fully visualize fundus.  Pupils equal briskly reactive to light.  Turns to localize faces and objects in the periphery. Turns to localize sounds in the periphery. Facial movements are symmetric. Motor: spastic quadriparesis  Sensory: withdrawal x 4 Coordination: unable to adequately assess due to patient's inability to participate in examination. No dysmetria with reach for objects. Gait and Station: unable to stand and bear weight.   Impression: Underweight  Restlessness and agitation  Congenital quadriplegia (HCC)  Generalized convulsive epilepsy (HCC)  Partial epilepsy with impairment of consciousness (HCC)  Protein-calorie malnutrition, unspecified severity (HCC)  Moderate intellectual disabilities  Picky eater  Oral aversion  Insomnia, unspecified type  Decreased motor strength    Recommendations for plan of care: The patient's previous Epic records were reviewed. No recent diagnostic studies to be reviewed with the patient. I talked with parents and answered questions. I explained that the seizure medicine levels drawn will look falsely high and need to be repeated as a trough level.  Plan until next visit: Return in a week to get blood drawn to check antiepileptic medication levels Check with [facility] about how to get a night time caregiver for Theodore Lewis. Return to PCP to get a B12 injection as recommended Work on giving scoop of Duocal in foods that Theodore Lewis will consume Continue medications as prescribed  Agree with PT evaluation  Call for questions or concerns Return in about 6 months (around 08/13/2024).  The medication list was reviewed and reconciled. No changes were made in the prescribed medications today. A complete medication list was provided to the patient.  Allergies as  of 02/12/2024       Reactions   Penicillins Other (See Comments)   Right side of body started shaking    Vancomycin Rash        Medication List        Accurate as of February 12, 2024  8:56 AM. If you have any questions, ask your nurse or doctor.          carbamazepine 300 MG 12 hr capsule Commonly known as: Carbatrol Take 1 capsule (300 mg total) by mouth 2 (two) times daily.   cetirizine HCl 5 MG/5ML Soln Commonly known as: Zyrtec Take 10 mLs (10 mg total) by mouth daily.   Depakote Sprinkles 125 MG capsule Generic drug: divalproex TAKE 5 CAPSULE BY MOUTH TWICE DAILY   diazepam 10 MG Gel Commonly known as: DIASTAT ACUDIAL INSERT 10 MG RECTALLY AT ONSET OF SEIZURE   ketoconazole 2 % cream Commonly known as: NIZORAL Apply 1 Application topically 2 (two) times daily as needed for irritation.   Linzess 72 MCG capsule Generic drug: linaclotide TAKE 1 CAPSULE(72 MCG) BY MOUTH DAILY BEFORE BREAKFAST      Total time spent with the patient was 40 minutes, of which  50% or more was spent in counseling and coordination of care.  Elveria Rising NP-C [facility] [address], [address] [place], [place] [address] Ph. [phone] Fax [phone]

## 2024-02-13 ENCOUNTER — Encounter (INDEPENDENT_AMBULATORY_CARE_PROVIDER_SITE_OTHER): Payer: Self-pay | Admitting: Family

## 2024-02-13 DIAGNOSIS — K219 Gastro-esophageal reflux disease without esophagitis: Secondary | ICD-10-CM | POA: Diagnosis not present

## 2024-02-13 DIAGNOSIS — K029 Dental caries, unspecified: Secondary | ICD-10-CM | POA: Diagnosis not present

## 2024-02-13 DIAGNOSIS — K0510 Chronic gingivitis, plaque induced: Secondary | ICD-10-CM | POA: Diagnosis not present

## 2024-02-13 DIAGNOSIS — R569 Unspecified convulsions: Secondary | ICD-10-CM | POA: Diagnosis not present

## 2024-02-13 DIAGNOSIS — G709 Myoneural disorder, unspecified: Secondary | ICD-10-CM | POA: Diagnosis not present

## 2024-02-13 DIAGNOSIS — G911 Obstructive hydrocephalus: Secondary | ICD-10-CM | POA: Diagnosis not present

## 2024-02-13 DIAGNOSIS — G808 Other cerebral palsy: Secondary | ICD-10-CM | POA: Diagnosis not present

## 2024-02-13 DIAGNOSIS — Z982 Presence of cerebrospinal fluid drainage device: Secondary | ICD-10-CM | POA: Diagnosis not present

## 2024-02-13 DIAGNOSIS — Z880 Allergy status to penicillin: Secondary | ICD-10-CM | POA: Diagnosis not present

## 2024-02-13 DIAGNOSIS — Z993 Dependence on wheelchair: Secondary | ICD-10-CM | POA: Diagnosis not present

## 2024-02-14 LAB — ZINC: Zinc: 72 ug/dL (ref 60–130)

## 2024-02-14 LAB — VITAMIN B1: Vitamin B1 (Thiamine): <6 nmol/L — ABNORMAL LOW (ref 8–30)

## 2024-02-14 LAB — CARBAMAZEPINE LEVEL, TOTAL: Carbamazepine Lvl: 6 mg/L (ref 4.0–12.0)

## 2024-02-14 LAB — VALPROIC ACID LEVEL: Valproic Acid Lvl: 126.3 mg/L — ABNORMAL HIGH (ref 50.0–100.0)

## 2024-02-15 DIAGNOSIS — E519 Thiamine deficiency, unspecified: Secondary | ICD-10-CM | POA: Insufficient documentation

## 2024-02-15 MED ORDER — VITAMIN B-1 50 MG PO TABS
50.0000 mg | ORAL_TABLET | Freq: Every day | ORAL | 0 refills | Status: AC
Start: 2024-02-15 — End: ?

## 2024-02-25 ENCOUNTER — Telehealth (INDEPENDENT_AMBULATORY_CARE_PROVIDER_SITE_OTHER): Payer: Self-pay | Admitting: Family

## 2024-02-25 NOTE — Telephone Encounter (Signed)
 I called Mom to review lab results performed by PCP. I left a message requesting a call back. TG

## 2024-02-26 ENCOUNTER — Ambulatory Visit (INDEPENDENT_AMBULATORY_CARE_PROVIDER_SITE_OTHER)

## 2024-02-26 ENCOUNTER — Other Ambulatory Visit (INDEPENDENT_AMBULATORY_CARE_PROVIDER_SITE_OTHER): Payer: Self-pay | Admitting: Family

## 2024-02-26 DIAGNOSIS — G40209 Localization-related (focal) (partial) symptomatic epilepsy and epileptic syndromes with complex partial seizures, not intractable, without status epilepticus: Secondary | ICD-10-CM

## 2024-02-26 DIAGNOSIS — D511 Vitamin B12 deficiency anemia due to selective vitamin B12 malabsorption with proteinuria: Secondary | ICD-10-CM

## 2024-02-26 DIAGNOSIS — G40309 Generalized idiopathic epilepsy and epileptic syndromes, not intractable, without status epilepticus: Secondary | ICD-10-CM

## 2024-02-26 MED ORDER — CYANOCOBALAMIN 1000 MCG/ML IJ SOLN
1000.0000 ug | Freq: Once | INTRAMUSCULAR | Status: AC
Start: 2024-02-26 — End: 2024-02-26
  Administered 2024-02-26: 1000 ug via INTRAMUSCULAR

## 2024-02-26 NOTE — Progress Notes (Signed)
 Patient is in office today for a nurse visit for B12 Injection, per PCP's order. Patient Injection was given in the  Left deltoid. Patient tolerated injection well.

## 2024-03-17 ENCOUNTER — Ambulatory Visit

## 2024-03-17 ENCOUNTER — Ambulatory Visit: Admitting: Family Medicine

## 2024-03-17 ENCOUNTER — Encounter: Payer: Self-pay | Admitting: Family Medicine

## 2024-03-17 ENCOUNTER — Ambulatory Visit: Payer: Self-pay | Admitting: Family Medicine

## 2024-03-17 VITALS — BP 110/82 | HR 68 | Temp 98.6°F | Ht <= 58 in

## 2024-03-17 DIAGNOSIS — S9031XA Contusion of right foot, initial encounter: Secondary | ICD-10-CM | POA: Diagnosis not present

## 2024-03-17 DIAGNOSIS — L859 Epidermal thickening, unspecified: Secondary | ICD-10-CM | POA: Diagnosis not present

## 2024-03-17 DIAGNOSIS — G40309 Generalized idiopathic epilepsy and epileptic syndromes, not intractable, without status epilepticus: Secondary | ICD-10-CM | POA: Diagnosis not present

## 2024-03-17 DIAGNOSIS — G808 Other cerebral palsy: Secondary | ICD-10-CM | POA: Diagnosis not present

## 2024-03-17 DIAGNOSIS — G911 Obstructive hydrocephalus: Secondary | ICD-10-CM

## 2024-03-17 DIAGNOSIS — T148XXA Other injury of unspecified body region, initial encounter: Secondary | ICD-10-CM

## 2024-03-17 DIAGNOSIS — D696 Thrombocytopenia, unspecified: Secondary | ICD-10-CM

## 2024-03-17 NOTE — Progress Notes (Signed)
 Acute Office Visit  Subjective:     Patient ID: Theodore Lewis, male    DOB: 05-27-1996, 28 y.o.   MRN: [id number]  Chief Complaint  Patient presents with   Acute Visit    Bruise on R foot present since Friday, L elbow hard spot present for a few months    HPI Patient is in today for evaluation of right foot bruising to the sole of the foot. He is accompanied by his mother who is acting as historian. Reports she noticed this almost a week ago, states it does not seem to bother him.  He cannot verbalize when he is in pain. Does not recall any injury, however [name] does like to crawl around and move freely about, did say he rolled off the couch but put himself back on the couch, states this is not an uncommon event. She is concerned about the bruising, would like to be sure that platelets are normal today.  Denies other bruising, other injuries.  Does report thickening of the skin to the left elbow.  Mom states that he uses the left elbow to crawl around on the carpet.  States she has been using Vaseline to the area with little relief. Reports that the area does not seem to bother [name], but she does not like the look of it. Denies known injury, other symptoms.  ROS Per HPI      Objective:    BP 110/82 (BP Location: Left Arm, Patient Position: Sitting)   Pulse 68   Temp 98.6 F (37 C) (Temporal)   Ht 4' (1.219 m)   SpO2 95%   BMI 23.19 kg/m    Physical Exam Vitals and nursing note reviewed.  Constitutional:      General: He is not in acute distress.    Comments: Cachectic  HENT:     Right Ear: External ear normal.     Left Ear: External ear normal.     Nose: Nose normal.     Mouth/Throat:     Mouth: Mucous membranes are moist.     Pharynx: Oropharynx is clear.  Cardiovascular:     Rate and Rhythm: Normal rate and regular rhythm.     Pulses: Normal pulses.     Heart sounds: Normal heart sounds.  Pulmonary:     Effort: Pulmonary effort is normal. No  respiratory distress.     Breath sounds: Normal breath sounds. No wheezing, rhonchi or rales.  Musculoskeletal:     Cervical back: Normal range of motion.     Right lower leg: No edema.     Left lower leg: No edema.     Comments: In wheelchair for visit  Lymphadenopathy:     Cervical: No cervical adenopathy.  Skin:    General: Skin is warm and dry.     Findings: Bruising present.     Comments: Bruising noted to the sole of the right foot, mildly swollen, does not appear to be tender, no obvious deformity there.  0.25 cm area of hyperkeratosis to left elbow, no bleeding, no discharge, no erythema, no heat, nontender  Neurological:     Mental Status: He is alert. Mental status is at baseline.    No results found for any visits on 03/17/24.      Assessment & Plan:   Congenital quadriplegia [facility]) Assessment & Plan: Discussed the possibility of him hitting the bottom of his foot on the foot rest of his wheelchair Discussed that they will be seeing new  motion mobility company and having wheelchair evaluation for [place] in the coming month   Orders: -     DG Foot Complete Right; Future  Generalized convulsive epilepsy (HCC)  Obstructive hydrocephalus (HCC)  Bruise Assessment & Plan: CBC with differential to eval for platelets X-ray of the right foot today  Orders: -     DG Foot Complete Right; Future -     CBC with Differential/Platelet  Hyperkeratosis Assessment & Plan: Continue Vaseline May use corn husker's lotion as well Discussed likelihood of this returning if we were to remove the hyperkeratotic area given the way that he weightbears on his elbow   Thrombocytopenia (HCC) Assessment & Plan: CBC with differential today   May continue using Vaseline or Corn Huskers lotion to the elbow  X-ray of the right foot today given bruising and inability to recall injury  Given complex diagnoses, we will also go ahead and check platelets today, no petechia  noted    No orders of the defined types were placed in this encounter.   Return if symptoms worsen or fail to improve.  Wellington Half, FNP

## 2024-03-20 ENCOUNTER — Encounter: Payer: Self-pay | Admitting: Family Medicine

## 2024-03-20 DIAGNOSIS — T148XXA Other injury of unspecified body region, initial encounter: Secondary | ICD-10-CM | POA: Insufficient documentation

## 2024-03-20 DIAGNOSIS — L859 Epidermal thickening, unspecified: Secondary | ICD-10-CM | POA: Insufficient documentation

## 2024-03-20 NOTE — Assessment & Plan Note (Signed)
 Continue Vaseline May use corn husker's lotion as well Discussed likelihood of this returning if we were to remove the hyperkeratotic area given the way that he weightbears on his elbow

## 2024-03-20 NOTE — Assessment & Plan Note (Signed)
 Discussed the possibility of him hitting the bottom of his foot on the foot rest of his wheelchair Discussed that they will be seeing new motion mobility company and having wheelchair evaluation for [place] in the coming month

## 2024-03-20 NOTE — Assessment & Plan Note (Signed)
CBC with differential today

## 2024-03-20 NOTE — Assessment & Plan Note (Signed)
 CBC with differential to eval for platelets X-ray of the right foot today

## 2024-03-20 NOTE — Patient Instructions (Signed)
 We are checking labs today, will be in contact with any results that require further attention  We are getting an xray today. We will be in contact with any abnormal results that require further attention.  Follow-up with me for new or worsening symptoms.

## 2024-03-23 DIAGNOSIS — G809 Cerebral palsy, unspecified: Secondary | ICD-10-CM | POA: Diagnosis not present

## 2024-06-17 ENCOUNTER — Ambulatory Visit (INDEPENDENT_AMBULATORY_CARE_PROVIDER_SITE_OTHER): Payer: 59

## 2024-06-17 VITALS — Ht <= 58 in | Wt 74.0 lb

## 2024-06-17 DIAGNOSIS — Z0000 Encounter for general adult medical examination without abnormal findings: Secondary | ICD-10-CM

## 2024-06-17 NOTE — Patient Instructions (Addendum)
 Mr. Senteno , Thank you for taking time out of your busy schedule to complete your Annual Wellness Visit with me. I enjoyed our conversation and look forward to speaking with you again next year. I, as well as your care team,  appreciate your ongoing commitment to your health goals. Please review the following plan we discussed and let me know if I can assist you in the future. Your Game plan/ To Do List    Referrals: If you haven't heard from the office you've been referred to, please reach out to them at the phone provided.   Follow up Visits: We will see or speak with you next year for your Next Medicare AWV with our clinical staff Have you seen your provider in the last 6 months (3 months if uncontrolled diabetes)? Yes  Clinician Recommendations:  Aim for 30 minutes of exercise or brisk walking, 6-8 glasses of water, and 5 servings of fruits and vegetables each day. Educated and advised on getting the Influenza and Hepatitis B vaccines in 2025.      This is a list of the screenings recommended for you:  Health Maintenance  Topic Date Due   Hepatitis B Vaccine (1 of 3 - 19+ 3-dose series) Never done   HPV Vaccine (1 - 3-dose SCDM series) Never done   COVID-19 Vaccine (5 - 2024-25 season) 07/06/2023   Flu Shot  06/04/2024   Medicare Annual Wellness Visit  06/17/2025   Hepatitis C Screening  Completed   HIV Screening  Completed   Meningitis B Vaccine  Aged Out   DTaP/Tdap/Td vaccine  Discontinued    Advanced directives: (Declined) Advance directive discussed with you today. Even though you declined this today, please call our office should you change your mind, and we can give you the proper paperwork for you to fill out. Advance Care Planning is important because it:  [x]  Makes sure you receive the medical care that is consistent with your values, goals, and preferences  [x]  It provides guidance to your family and loved ones and reduces their decisional burden about whether or not  they are making the right decisions based on your wishes.  Follow the link provided in your after visit summary or read over the paperwork we have mailed to you to help you started getting your Advance Directives in place. If you need assistance in completing these, please reach out to us  so that we can help you!

## 2024-06-17 NOTE — Progress Notes (Signed)
 Subjective:   Theodore Lewis is a 28 y.o. who presents for a Medicare Wellness preventive visit.  As a reminder, Annual Wellness Visits don't include a physical exam, and some assessments may be limited, especially if this visit is performed virtually. We may recommend an in-person follow-up visit with your provider if needed.  Visit Complete: Virtual I connected with  Theodore Lewis on 06/17/24 by a audio enabled telemedicine application and verified that I am speaking with the correct person using two identifiers.  Patient Location: Home  Provider Location: Office/Clinic  I discussed the limitations of evaluation and management by telemedicine. The patient expressed understanding and agreed to proceed.  Vital Signs: Because this visit was a virtual/telehealth visit, some criteria may be missing or patient reported. Any vitals not documented were not able to be obtained and vitals that have been documented are patient reported.  VideoDeclined- This patient declined Librarian, academic. Therefore the visit was completed with audio only.  Persons Participating in Visit: Patient assisted by Mother, Emre Stock.  AWV Questionnaire: No: Patient Medicare AWV questionnaire was not completed prior to this visit.  Cardiac Risk Factors include: advanced age (>97men, >81 women);male gender     Objective:    Today's Vitals   06/17/24 0938  Weight: 74 lb (33.6 kg)  Height: 4' 1 (1.245 m)   Body mass index is 21.67 kg/m.     06/17/2024    9:51 AM 12/15/2023    8:13 AM 12/12/2023    3:22 PM 10/28/2023    6:04 AM 08/21/2023    3:24 PM 06/17/2023   10:09 AM 02/21/2023   12:11 PM  Advanced Directives  Does Patient Have a Medical Advance Directive? No No No Yes No No No  Would patient like information on creating a medical advance directive? No - Patient declined No - Patient declined    No - Patient declined     Current Medications (verified) Outpatient  Encounter Medications as of 06/17/2024  Medication Sig   carbamazepine  (CARBATROL ) 300 MG 12 hr capsule Take 1 capsule (300 mg total) by mouth 2 (two) times daily.   cetirizine  HCl (ZYRTEC ) 5 MG/5ML SOLN Take 10 mLs (10 mg total) by mouth daily.   DEPAKOTE  SPRINKLES 125 MG capsule TAKE 5 CAPSULES BY MOUTH TWICE DAILY   diazepam  (DIASTAT  ACUDIAL) 10 MG GEL INSERT 10 MG RECTALLY AT ONSET OF SEIZURE   LINZESS  72 MCG capsule TAKE 1 CAPSULE(72 MCG) BY MOUTH DAILY BEFORE BREAKFAST   thiamine  (VITAMIN B-1) 50 MG tablet Take 1 tablet (50 mg total) by mouth daily.   ketoconazole  (NIZORAL ) 2 % cream Apply 1 Application topically 2 (two) times daily as needed for irritation. (Patient not taking: Reported on 06/17/2024)   No facility-administered encounter medications on file as of 06/17/2024.    Allergies (verified) Penicillins and Vancomycin    History: Past Medical History:  Diagnosis Date   Anxiety    Cerebral palsy (HCC)    Congenital quadriparesis (HCC)    Laryngopharyngeal reflux (LPR) 04/05/2021   Neuromuscular disorder (HCC)    Seizure (HCC)    Seizures (HCC)    last seizure 3 yrs ago   Past Surgical History:  Procedure Laterality Date   BRAIN SURGERY     Shunt placed when he was a yr. old   I & D EXTREMITY Bilateral 04/29/2014   Procedure: IRRIGATION AND DEBRIDEMENT EXTREMITY;  Surgeon: Oneil JAYSON Herald, MD;  Location: [facility] OR;  Service: Orthopedics;  Laterality: Bilateral;  Right and Left Hamstring Lenghening and Fiberglass Casting   LEG TENDON SURGERY Bilateral approx 6 yrs ago   RADIOLOGY WITH ANESTHESIA N/A 12/15/2023   Procedure: CT HEMATURIA WORKUP WITH ANESTHESIA;  Surgeon: Radiologist, Medication, MD;  Location: [facility] OR;  Service: Radiology;  Laterality: N/A;   Family History  Problem Relation Age of Onset   Hyperlipidemia Mother    Heart disease Mother    Diabetes Mother    Healthy Mother    Learning disabilities Father    Hypertension Father    Hyperlipidemia Father    Heart  disease Father    Diabetes Father    Hypertension Maternal Grandmother    Intellectual disability Maternal Grandfather    Hypertension Maternal Grandfather    Hyperlipidemia Maternal Grandfather    Heart disease Maternal Grandfather    Cancer Paternal Grandmother    Early death Paternal Grandmother    Stomach cancer Paternal Grandmother        Died at 17   Cancer Paternal Grandfather    Early death Paternal Grandfather    Throat cancer Paternal Grandfather        Died at 28   Stomach cancer Paternal Grandfather    Liver disease Neg Hx    Esophageal cancer Neg Hx    Colon cancer Neg Hx    Social History   Socioeconomic History   Marital status: Single    Spouse name: Not on file   Number of children: 0   Years of education: Not on file   Highest education level: GED or equivalent  Occupational History   Occupation: disbale  Tobacco Use   Smoking status: Never    Passive exposure: Never   Smokeless tobacco: Never  Vaping Use   Vaping status: Never Used  Substance and Sexual Activity   Alcohol use: No   Drug use: No   Sexual activity: Never  Other Topics Concern   Not on file  Social History Narrative   Theodore Lewis live at home with mom and dad.   He enjoys going outside, and riding.    Social Drivers of Corporate investment banker Strain: Low Risk  (06/17/2024)   Overall Financial Resource Strain (CARDIA)    Difficulty of Paying Living Expenses: Not hard at all  Food Insecurity: No Food Insecurity (06/17/2024)   Hunger Vital Sign    Worried About Running Out of Food in the Last Year: Never true    Ran Out of Food in the Last Year: Never true  Transportation Needs: No Transportation Needs (06/17/2024)   PRAPARE - Administrator, Civil Service (Medical): No    Lack of Transportation (Non-Medical): No  Physical Activity: Inactive (06/17/2024)   Exercise Vital Sign    Days of Exercise per Week: 0 days    Minutes of Exercise per Session: 0 min  Stress: No  Stress Concern Present (06/17/2024)   [employer] of Occupational Health - Occupational Stress Questionnaire    Feeling of Stress: Not at all  Social Connections: Socially Isolated (06/17/2024)   Social Connection and Isolation Panel    Frequency of Communication with Friends and Family: Never    Frequency of Social Gatherings with Friends and Family: Never    Attends Religious Services: Never    Database administrator or Organizations: No    Attends Banker Meetings: Never    Marital Status: Never married    Tobacco Counseling Counseling given: No    Clinical Intake:  Consulting civil engineer  completed: Yes  Pain : No/denies pain     BMI - recorded: 21.67 Nutritional Status: BMI of 19-24  Normal Nutritional Risks: None Diabetes: No  No results found for: HGBA1C   How often do you need to have someone help you when you read instructions, pamphlets, or other written materials from your doctor or pharmacy?: 5 - Always (Parents assists)  Interpreter Needed?: No  Information entered by :: Verdie Saba, CMA   Activities of Daily Living     06/17/2024    9:41 AM 12/15/2023    8:18 AM  In your present state of health, do you have any difficulty performing the following activities:  Hearing? 0   Vision? 0   Difficulty concentrating or making decisions? 1   Comment Parents assists   Walking or climbing stairs? 1   Comment uses a wheelchair   Dressing or bathing? 0   Doing errands, shopping? 1 0  Comment Parents assists   Preparing Food and eating ? Y   Comment Parents assists   Using the Toilet? Y   Comment Parents assists   In the past six months, have you accidently leaked urine? Y   Comment wears depends   Do you have problems with loss of bowel control? Y   Comment wears depends   Managing your Medications? Y   Comment Parents assists   Managing your Finances? Y   Comment Parents assists   Housekeeping or managing your Housekeeping? Y    Comment Parents assists     Patient Care Team: Duane Debby CROME, MD as PCP - General (Internal Medicine)  I have updated your Care Teams any recent Medical Services you may have received from other providers in the past year.     Assessment:   This is a routine wellness examination for Theodore Lewis.  Hearing/Vision screen Hearing Screening - Comments:: Denies hearing difficulties   Vision Screening - Comments:: Denies vision concerns   Goals Addressed               This Visit's Progress     Patient Stated (pt-stated)        Patient's mother stated they are taking 1 day at a time - things are becoming more challenging as he ages       Depression Screen     06/17/2024    9:56 AM 08/21/2023    1:58 PM 06/17/2023   10:13 AM 11/21/2022    1:44 PM 04/02/2022    2:50 PM 08/02/2021   10:16 AM  PHQ 2/9 Scores  PHQ - 2 Score 4 0 0     PHQ- 9 Score 10  0     Exception Documentation    Medical reason Medical reason Medical reason    Fall Risk     06/17/2024    9:42 AM 11/06/2023    3:04 PM 08/21/2023    1:57 PM 06/17/2023   10:09 AM  Fall Risk   Falls in the past year? 0 0 0 0  Number falls in past yr: 0 0 0 0  Injury with Fall? 0 0 0 0  Risk for fall due to : No Fall Risks No Fall Risks No Fall Risks No Fall Risks  Follow up Falls evaluation completed;Falls prevention discussed Falls evaluation completed Falls evaluation completed Falls prevention discussed    MEDICARE RISK AT HOME:  Medicare Risk at Home Any stairs in or around the home?: No If so, are there any without handrails?: No Home free  of loose throw rugs in walkways, pet beds, electrical cords, etc?: Yes Adequate lighting in your home to reduce risk of falls?: Yes Life alert?: No Use of a cane, walker or w/c?: Yes (wheelchair) Grab bars in the bathroom?: Yes Shower chair or bench in shower?: Yes Elevated toilet seat or a handicapped toilet?: No  TIMED UP AND GO:  Was the test performed?  No  Cognitive  Function: Impaired: Patient has current diagnosis of cognitive impairment.    06/17/2024    9:59 AM  MMSE - Mini Mental State Exam  Not completed: Unable to complete        Immunizations Immunization History  Administered Date(s) Administered   Fluzone Influenza virus vaccine,trivalent (IIV3), split virus 08/30/2015, 07/02/2016   Influenza Inj Mdck Quad With Preservative 07/17/2022   Influenza, Quadrivalent, Recombinant, Inj, Pf 07/24/2021   Influenza,inj,Quad PF,6+ Mos 07/08/2019   Influenza-Unspecified 07/24/2021, 09/05/2023   PFIZER(Purple Top)SARS-COV-2 Vaccination 03/13/2020, 04/04/2020, 10/05/2020   Pfizer(Comirnaty)Fall Seasonal Vaccine 12 years and older 08/14/2022    Screening Tests Health Maintenance  Topic Date Due   Hepatitis B Vaccines 19-59 Average Risk (1 of 3 - 19+ 3-dose series) Never done   HPV VACCINES (1 - 3-dose SCDM series) Never done   COVID-19 Vaccine (5 - 2024-25 season) 07/06/2023   INFLUENZA VACCINE  06/04/2024   Medicare Annual Wellness (AWV)  06/17/2025   Hepatitis C Screening  Completed   HIV Screening  Completed   Meningococcal B Vaccine  Aged Out   DTaP/Tdap/Td  Discontinued    Health Maintenance  Health Maintenance Due  Topic Date Due   Hepatitis B Vaccines 19-59 Average Risk (1 of 3 - 19+ 3-dose series) Never done   HPV VACCINES (1 - 3-dose SCDM series) Never done   COVID-19 Vaccine (5 - 2024-25 season) 07/06/2023   INFLUENZA VACCINE  06/04/2024   Health Maintenance Items Addressed:  Mother will check immunization records for Hepatitis B vaccines (if did not have will schedule an appt)   Additional Screening:  Vision Screening: Recommended annual ophthalmology exams for early detection of glaucoma and other disorders of the eye. Would you like a referral to an eye doctor? No    Dental Screening: Recommended annual dental exams for proper oral hygiene  Community Resource Referral / Chronic Care Management: CRR required this  visit?  No   CCM required this visit?  No   Plan:    I have personally reviewed and noted the following in the patient's chart:   Medical and social history Use of alcohol, tobacco or illicit drugs  Current medications and supplements including opioid prescriptions. Patient is not currently taking opioid prescriptions. Functional ability and status Nutritional status Physical activity Advanced directives List of other physicians Hospitalizations, surgeries, and ER visits in previous 12 months Vitals Screenings to include cognitive, depression, and falls Referrals and appointments  In addition, I have reviewed and discussed with patient certain preventive protocols, quality metrics, and best practice recommendations. A written personalized care plan for preventive services as well as general preventive health recommendations were provided to patient.   Verdie CHRISTELLA Saba, CMA   06/17/2024   After Visit Summary: (MyChart) Due to this being a telephonic visit, the after visit summary with patients personalized plan was offered to patient via MyChart   Notes: Scheduled a 1-yr Physical for pt w/PCP for 11/2024.

## 2024-08-09 ENCOUNTER — Other Ambulatory Visit: Payer: Self-pay | Admitting: Internal Medicine

## 2024-08-09 ENCOUNTER — Other Ambulatory Visit (INDEPENDENT_AMBULATORY_CARE_PROVIDER_SITE_OTHER): Payer: Self-pay | Admitting: Family

## 2024-08-09 DIAGNOSIS — G40309 Generalized idiopathic epilepsy and epileptic syndromes, not intractable, without status epilepticus: Secondary | ICD-10-CM

## 2024-08-09 DIAGNOSIS — G40209 Localization-related (focal) (partial) symptomatic epilepsy and epileptic syndromes with complex partial seizures, not intractable, without status epilepticus: Secondary | ICD-10-CM

## 2024-08-09 DIAGNOSIS — J301 Allergic rhinitis due to pollen: Secondary | ICD-10-CM

## 2024-08-11 ENCOUNTER — Other Ambulatory Visit (HOSPITAL_COMMUNITY): Payer: Self-pay

## 2024-08-11 ENCOUNTER — Telehealth (INDEPENDENT_AMBULATORY_CARE_PROVIDER_SITE_OTHER): Payer: Self-pay | Admitting: Pharmacy Technician

## 2024-08-11 NOTE — Telephone Encounter (Signed)
 Pharmacy Patient Advocate Encounter  Received notification from [employer] that Prior Authorization for diazePAM  10MG  gel  has been CANCELLED due to No PA needed.     PA #/Case ID/Reference #: [id number]

## 2024-08-11 NOTE — Telephone Encounter (Signed)
 Pharmacy Patient Advocate Encounter   Received notification from CoverMyMeds that prior authorization for diazePAM  10MG  gel  is required/requested.   Insurance verification completed.   The patient is insured through [facility].   Per test claim: PA required; PA submitted to above mentioned insurance via Latent Key/confirmation #/EOC [id number] Status is pending

## 2024-08-12 ENCOUNTER — Other Ambulatory Visit (INDEPENDENT_AMBULATORY_CARE_PROVIDER_SITE_OTHER): Payer: Self-pay

## 2024-08-12 DIAGNOSIS — G40209 Localization-related (focal) (partial) symptomatic epilepsy and epileptic syndromes with complex partial seizures, not intractable, without status epilepticus: Secondary | ICD-10-CM

## 2024-08-12 DIAGNOSIS — G40309 Generalized idiopathic epilepsy and epileptic syndromes, not intractable, without status epilepticus: Secondary | ICD-10-CM

## 2024-08-12 MED ORDER — DEPAKOTE SPRINKLES 125 MG PO CSDR: DELAYED_RELEASE_CAPSULE | ORAL | 3 refills | Status: DC

## 2024-08-13 NOTE — Progress Notes (Signed)
 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 This was treated with VP shunt. He has resultant spastic quadriparesis, contractures, poor vision, evidence of an axial collection of mixed type in the right frontal-temporal-parietal region and possible infarction of the right brain with encephalomalacia of the left brain, as well as intermittent episode of agitation. He is taking and tolerating Depakote  and Carbatrol  for his seizure disorder. He is cared for at home by his parents, but his father has significant health problems that is limiting his ability to provide physical care    Due to his medical condition, he is indefinitely incontinent of stool and urine.  It is medically necessary for him to use diapers, underpads, and gloves to assist with hygiene and skin integrit  Today's concerns: Parents report today that Dovber has had a brief seizure in the setting of missed medication. They report that sometimes he refuses to take his medication, regardless of how they offer them to him They report that Rossie continues to be a picky eater but that he has been doing better lately.  Mom reports ongoing caregiver fatigue. Dad has health problems and is unable to help as much as he used to be able to do. Wacey has been otherwise generally healthy since he was last seen. No health concerns today other than previously mentioned.  Review of systems: Please see HPI for neurologic and other pertinent review of systems. Otherwise all  other systems were reviewed and were negative.  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 02/10/2024   Encounter for general adult medical examination with abnormal findings 11/26/2023   Other microscopic hematuria 11/06/2023   Chronic idiopathic constipation 09/12/2023   Tinea cruris 09/12/2023   Flu vaccine need 08/21/2023   Oral aversion 07/04/2023   Insomnia 07/04/2023   Abnormal barium swallow 11/14/2022   Increased nutritional needs 11/14/2022   Dysphagia 08/09/2022   Protein-calorie malnutrition 08/09/2022   Picky eater 08/09/2022   Decreased motor strength 11/12/2021   Thrombocytopenia 08/02/2021   Seasonal allergic rhinitis due to pollen 08/02/2021   Cachexia 08/02/2021   Laryngopharyngeal reflux (LPR) 04/05/2021   Incontinence of feces 11/15/2020   Oppositional behavior 09/22/2020   VP (ventriculoperitoneal) shunt status 08/30/2015   Restlessness and agitation 04/20/2015   Moderate intellectual disabilities 02/24/2013   History of congenital brain abnormality 02/24/2013   Obstructive hydrocephalus (HCC) 02/24/2013   Congenital quadriplegia (HCC) 02/24/2013   Generalized convulsive epilepsy (HCC) 02/24/2013   Partial epilepsy with impairment of consciousness (HCC) 02/24/2013     Past Medical History:  Diagnosis Date   Anxiety    Cerebral palsy (HCC)    Congenital quadriparesis (HCC)    Laryngopharyngeal reflux (LPR) 04/05/2021   Neuromuscular disorder (HCC)    Seizure (HCC)    Seizures (HCC)    last seizure 3 yrs ago    Past medical history  comments: See HPI Copied from previous record: He presented at few months of age with massive hydrocephalus.  He required urgent placement of a ventriculoperitoneal shunt.  He has experienced revisions in the past.   Brain images in 2007 showed an extra-axial collection of mixed type in the right frontotemporal parietal region with possible infarction in the right brain and significant encephalomalacia of the left brain.  Ventricles were well decompressed.   A CT scan of the brain and shunt series Mar 04, 2013 showed microcephaly with marked diffuse thickening of the calvarium and marked enlargement of the mastoid sinus bilaterally. Left frontal shunt catheter extends to the region of the third ventricle and is unchanged. No hydrocephalus. There is cerebellar atrophy bilaterally. 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 Brainstem is small. There is atrophy of the left occipital lobe. Agenesis of the corpus callosum.   Large extra-axial fluid collection on the right measures 4.6 x 11.1 cm. This has low density centrally and the wall has calcified since the prior study. The fluid collection has matured and become better defined since the prior study. There is some mass effect on the right cerebral hemisphere. No midline shift to the left. No acute hemorrhage. Shunt tubing was intact from the ventricles to the abdomen.  Surgical history: Past Surgical History:  Procedure Laterality Date   BRAIN SURGERY     Shunt placed when he was a yr. old   I & D EXTREMITY Bilateral 04/29/2014   Procedure: IRRIGATION AND DEBRIDEMENT EXTREMITY;  Surgeon: Oneil JAYSON Herald, MD;  Location: [facility] OR;  Service: Orthopedics;  Laterality: Bilateral;  Right and Left Hamstring Lenghening and Fiberglass Casting   LEG TENDON SURGERY Bilateral approx 6 yrs ago   RADIOLOGY WITH ANESTHESIA N/A 12/15/2023   Procedure: CT HEMATURIA WORKUP WITH ANESTHESIA;  Surgeon: Radiologist, Medication, MD;  Location: [facility] OR;  Service: Radiology;  Laterality: N/A;     Family history: family history includes Cancer in his paternal grandfather and paternal grandmother; Diabetes in his father and mother; Early death in his paternal  grandfather and paternal grandmother; Healthy in his mother; Heart disease in his father, maternal grandfather, and mother; Hyperlipidemia in his father, maternal grandfather, and mother; Hypertension in his father, maternal grandfather, and maternal grandmother; Intellectual disability in his maternal grandfather; Learning disabilities in his father; Stomach cancer in his paternal grandfather and paternal grandmother; Throat cancer in his paternal grandfather.   Social history: Social History   Socioeconomic History   Marital status: Single    Spouse name: Not on file   Number of children: 0   Years of education: Not on file   Highest education level: GED or equivalent  Occupational History   Occupation: disbale  Tobacco Use   Smoking status: Never    Passive exposure: Never   Smokeless tobacco: Never  Vaping Use   Vaping status: Never Used  Substance and Sexual Activity   Alcohol use: No   Drug use: No   Sexual activity: Never  Other Topics Concern   Not on file  Social History Narrative   Hong live at home with mom and dad.   He enjoys going outside, and riding.    Social Drivers of Corporate investment banker Strain: Low Risk  (06/17/2024)   Overall Financial Resource Strain (CARDIA)    Difficulty of Paying Living Expenses: Not hard at all  Food Insecurity: No Food Insecurity (06/17/2024)   Hunger Vital Sign    Worried About  Running Out of Food in the Last Year: Never true    Ran Out of Food in the Last Year: Never true  Transportation Needs: No Transportation Needs (06/17/2024)   PRAPARE - Administrator, Civil Service (Medical): No    Lack of Transportation (Non-Medical): No  Physical Activity: Inactive (06/17/2024)   Exercise Vital Sign    Days of Exercise per Week: 0 days    Minutes of Exercise per Session: 0 min  Stress: No Stress Concern Present (06/17/2024)   [employer] of Occupational Health - Occupational Stress Questionnaire    Feeling of  Stress: Not at all  Social Connections: Socially Isolated (06/17/2024)   Social Connection and Isolation Panel    Frequency of Communication with Friends and Family: Never    Frequency of Social Gatherings with Friends and Family: Never    Attends Religious Services: Never    Database administrator or Organizations: No    Attends Banker Meetings: Never    Marital Status: Never married  Intimate Partner Violence: Not At Risk (06/17/2024)   Humiliation, Afraid, Rape, and Kick questionnaire    Fear of Current or Ex-Partner: No    Emotionally Abused: No    Physically Abused: No    Sexually Abused: No    Past/failed meds:  Allergies: Allergies  Allergen Reactions   Penicillins Other (See Comments)    Right side of body started shaking     Vancomycin  Rash    Immunizations: Immunization History  Administered Date(s) Administered   Fluzone Influenza virus vaccine,trivalent (IIV3), split virus 08/30/2015, 07/02/2016   Influenza Inj Mdck Quad With Preservative 07/17/2022   Influenza, Quadrivalent, Recombinant, Inj, Pf 07/24/2021   Influenza,inj,Quad PF,6+ Mos 07/08/2019   Influenza-Unspecified 07/24/2021, 09/05/2023   PFIZER(Purple Top)SARS-COV-2 Vaccination 03/13/2020, 04/04/2020, 10/05/2020   Pfizer(Comirnaty)Fall Seasonal Vaccine 12 years and older 08/14/2022    Diagnostics/Screenings: Copied from previous record: 08/22/2022 - Barium Swallow - Limited study as pt was apprehensive to swallow the barium *despite it being sweetened with artificial sweetener* and several options provided.  In addition, suboptimal view due to pt leaning forward out of flouro view. Patient's mother had to help feed Fonda - as he would only accept *reluctantly* intake from her.  Mild oral dysphagia most c/b impaired oral control resulting in premature spillage of boluses into pharynx and lack of adequate mastication.  Pt did not adequate masticate with sweet potato despite mom rubbing his chin  in effort to induce mastication. Minimal vertical mastication observed with pt and prolonged lingual press to palate to transit bolus into pharynx it- consistent with intellectual disability. Pudding bolus rapidly spilled (dumped) into pharynx with swallow triggering at vallecular space.  SLP had pt use his bottle/straw during the test - with him accepting only small sips but adequate airway protection.  Premature spillage to pyriform sinus with thin observed prior to swallow trigger.     Pharyngeal swallow was strong without any retention fortunately. Kiet did not cough or gag during po trials of MBS - but he only accepted approx. 7 boluses total.       Upon esophageal sweep, pt noted to be kyphotic - which raises concerns for potential multifactorial dysphagia including esophageal issues or GI issues contributing to gagging.  Please see video loops of esophageal sweep.       03/12/2021 - CT head wo contrast - 1. Outside of the chest, a second new discontinuity of the shunt tubing is identified overlying the left mastoid. 2.  But there is no ventriculomegaly. And the non contrast CT appearance of the brain is stable since 2018.   05/23/2017 - CT head wo contrast - No acute finding. Chronic microcephaly and brain atrophy. Chronic calcified subdural collection on the right, not significantly changed since 03/04/2013. No evidence of shunt malfunction. Sinuses clear  Physical Exam: Wt 75 lb (34 kg)   BMI 21.96 kg/m  Wt Readings from Last 3 Encounters:  08/16/24 75 lb (34 kg)  06/17/24 74 lb (33.6 kg)  02/12/24 76 lb (34.5 kg)  General: thin but well developed, well nourished young man, seated in wheelchair, in no evident distress Head: normocephalic and atraumatic. Oropharynx difficult to examine but appears benign. No dysmorphic features. Neck: supple Cardiovascular: regular rate and rhythm, no murmurs. Respiratory: clear to auscultation bilaterally Abdomen: bowel sounds present all four  quadrants, abdomen soft, non-tender, non-distended.  Musculoskeletal: no skeletal deformities or obvious scoliosis. Has muscle wasting and flexion contractures Skin: no rashes or neurocutaneous lesions  Neurologic Exam Mental Status: awake and fully alert. Has no language.  Holds a large binder clip in his hand and becomes upset if it is removed. Resistant to invasions into his space. Unable to follow instructions or participate in examination Cranial Nerves: fundoscopic exam - red reflex present.  Unable to fully visualize fundus.  Pupils equal briskly reactive to light.  Turns to localize faces and objects in the periphery. Turns to localize sounds in the periphery. Facial movements are symmetric Motor: spastic quadriparesis  Sensory: withdrawal x 4 Coordination: unable to adequately assess due to patient's inability to participate in examination. No dysmetria with reach for objects. Gait and Station: unable to stand and bear weight.   Impression: Generalized convulsive epilepsy (HCC) - Plan: carbamazepine  (CARBATROL ) 300 MG 12 hr capsule, DEPAKOTE  SPRINKLES 125 MG capsule, diazepam  (DIASTAT  ACUDIAL) 10 MG GEL  Partial epilepsy with impairment of consciousness (HCC) - Plan: carbamazepine  (CARBATROL ) 300 MG 12 hr capsule, DEPAKOTE  SPRINKLES 125 MG capsule, diazepam  (DIASTAT  ACUDIAL) 10 MG GEL  Congenital quadriplegia (HCC)  Moderate intellectual disabilities  Protein-calorie malnutrition, unspecified severity  Picky eater  Oppositional behavior   Recommendations for plan of care: The patient's previous Epic records were reviewed. No recent diagnostic studies to be reviewed with the patient. I talked with his parents about ways to get the seizure medications in. I recommended a firm but calm approach and offering no more than 2 choices of how or when to take the medication. I explained that if he does not take his medication that he is at risk of seizure breakthrough. Plan until next  visit: Continue medications as prescribed  Call if seizures occur Call for questions or concerns Return in about 6 months (around 02/14/2025).  The medication list was reviewed and reconciled. No changes were made in the prescribed medications today. A complete medication list was provided to the patient.   Allergies as of 08/16/2024       Reactions   Penicillins Other (See Comments)   Right side of body started shaking    Vancomycin  Rash        Medication List        Accurate as of August 16, 2024 11:59 PM. If you have any questions, ask your nurse or doctor.          carbamazepine  300 MG 12 hr capsule Commonly known as: Carbatrol  Take 1 capsule (300 mg total) by mouth 2 (two) times daily.   cetirizine  HCl 5 MG/5ML Soln Commonly known as: Zyrtec   TAKE 10 ML(10 MG) BY MOUTH DAILY   Depakote  Sprinkles 125 MG capsule Generic drug: divalproex  TAKE 5 CAPSULES BY MOUTH TWICE DAILY   diazepam  10 MG Gel Commonly known as: DIASTAT  ACUDIAL INSERT 10 MG RECTALLY AT ONSET OF SEIZURE   ketoconazole  2 % cream Commonly known as: NIZORAL  Apply 1 Application topically 2 (two) times daily as needed for irritation.   Linzess  72 MCG capsule Generic drug: linaclotide  TAKE 1 CAPSULE(72 MCG) BY MOUTH DAILY BEFORE BREAKFAST   thiamine  50 MG tablet Commonly known as: VITAMIN B-1 Take 1 tablet (50 mg total) by mouth daily.      Total time spent with the patient was 30 minutes, of which 50% or more was spent in counseling and coordination of care.  Ellouise Bollman NP-C [facility] [address], [address] [place], [place] [address] Ph. [phone] Fax [phone]

## 2024-08-16 ENCOUNTER — Encounter (INDEPENDENT_AMBULATORY_CARE_PROVIDER_SITE_OTHER): Payer: Self-pay | Admitting: Family

## 2024-08-16 ENCOUNTER — Ambulatory Visit (INDEPENDENT_AMBULATORY_CARE_PROVIDER_SITE_OTHER): Admitting: Family

## 2024-08-16 VITALS — Wt 75.0 lb

## 2024-08-16 DIAGNOSIS — F71 Moderate intellectual disabilities: Secondary | ICD-10-CM | POA: Diagnosis not present

## 2024-08-16 DIAGNOSIS — G40309 Generalized idiopathic epilepsy and epileptic syndromes, not intractable, without status epilepticus: Secondary | ICD-10-CM | POA: Diagnosis not present

## 2024-08-16 DIAGNOSIS — G808 Other cerebral palsy: Secondary | ICD-10-CM | POA: Diagnosis not present

## 2024-08-16 DIAGNOSIS — R4689 Other symptoms and signs involving appearance and behavior: Secondary | ICD-10-CM

## 2024-08-16 DIAGNOSIS — E46 Unspecified protein-calorie malnutrition: Secondary | ICD-10-CM | POA: Diagnosis not present

## 2024-08-16 DIAGNOSIS — R6339 Other feeding difficulties: Secondary | ICD-10-CM | POA: Diagnosis not present

## 2024-08-16 DIAGNOSIS — G40209 Localization-related (focal) (partial) symptomatic epilepsy and epileptic syndromes with complex partial seizures, not intractable, without status epilepticus: Secondary | ICD-10-CM

## 2024-08-16 MED ORDER — DIAZEPAM 10 MG RE GEL: RECTAL | 5 refills | Status: AC

## 2024-08-16 MED ORDER — DEPAKOTE SPRINKLES 125 MG PO CSDR: DELAYED_RELEASE_CAPSULE | ORAL | 3 refills | Status: AC

## 2024-08-16 MED ORDER — CARBAMAZEPINE ER 300 MG PO CP12: 300.0000 mg | ORAL_CAPSULE | Freq: Two times a day (BID) | ORAL | 3 refills | Status: AC

## 2024-08-16 NOTE — Patient Instructions (Signed)
 It was a pleasure to see you today!  Instructions for you until your next appointment are as follows: Work on giving Winferd 2 choices for taking his medication. It is important that he does not miss any doses.  Let me know if Nathan has seizures Continue to work on [employer] a variety of foods each day Please sign up for MyChart if you have not done so. Please plan to return for follow up in 6 months or sooner if needed.  Feel free to contact our office during normal business hours at [phone] with questions or concerns. If there is no answer or the call is outside business hours, please leave a message and our clinic staff will call you back within the next business day.  If you have an urgent concern, please stay on the line for our after-hours answering service and ask for the on-call neurologist.     I also encourage you to use MyChart to communicate with me more directly. If you have not yet signed up for MyChart within [facility], the front desk staff can help you. However, please note that this inbox is NOT monitored on nights or weekends, and response can take up to 2 business days.  Urgent matters should be discussed with the on-call pediatric neurologist.   At [facility], we are committed to providing exceptional care. You will receive a patient satisfaction survey through text or email regarding your visit today. Your opinion is important to me. Comments are appreciated.

## 2024-08-18 ENCOUNTER — Encounter (INDEPENDENT_AMBULATORY_CARE_PROVIDER_SITE_OTHER): Payer: Self-pay | Admitting: Family

## 2024-08-20 NOTE — Telephone Encounter (Signed)
 CALLED PT TO CONFIRM APPT ON 10/20 LEFT VM

## 2024-09-09 ENCOUNTER — Emergency Department (HOSPITAL_COMMUNITY)

## 2024-09-09 ENCOUNTER — Emergency Department (HOSPITAL_COMMUNITY): Admission: EM | Admit: 2024-09-09 | Discharge: 2024-09-10 | Disposition: A | Discharge status: Home / Self Care

## 2024-09-09 DIAGNOSIS — R110 Nausea: Secondary | ICD-10-CM

## 2024-09-09 DIAGNOSIS — R569 Unspecified convulsions: Secondary | ICD-10-CM | POA: Insufficient documentation

## 2024-09-09 DIAGNOSIS — R112 Nausea with vomiting, unspecified: Secondary | ICD-10-CM | POA: Insufficient documentation

## 2024-09-09 LAB — COMPREHENSIVE METABOLIC PANEL WITH GFR
ALT: 31 U/L (ref 0–44)
AST: 34 U/L (ref 15–41)
Albumin: 4.7 g/dL (ref 3.5–5.0)
Alkaline Phosphatase: 94 U/L (ref 38–126)
Anion gap: 15 (ref 5–15)
BUN: 11 mg/dL (ref 6–20)
CO2: 24 mmol/L (ref 22–32)
Calcium: 10.4 mg/dL — ABNORMAL HIGH (ref 8.9–10.3)
Chloride: 98 mmol/L (ref 98–111)
Creatinine, Ser: 0.74 mg/dL (ref 0.61–1.24)
GFR, Estimated: >60 mL/min (ref >60)
Glucose, Bld: 97 mg/dL (ref 70–99)
Potassium: 4 mmol/L (ref 3.5–5.1)
Sodium: 137 mmol/L (ref 135–145)
Total Bilirubin: 0.6 mg/dL (ref 0.0–1.2)
Total Protein: 8.3 g/dL — ABNORMAL HIGH (ref 6.5–8.1)

## 2024-09-09 LAB — CBC WITH DIFFERENTIAL/PLATELET
Abs Immature Granulocytes: 0.02 K/uL (ref 0.00–0.07)
Basophils Absolute: 0 K/uL (ref 0.0–0.1)
Basophils Relative: 0 %
Eosinophils Absolute: 0 K/uL (ref 0.0–0.5)
Eosinophils Relative: 0 %
HCT: 53.4 % — ABNORMAL HIGH (ref 39.0–52.0)
Hemoglobin: 17.2 g/dL — ABNORMAL HIGH (ref 13.0–17.0)
Immature Granulocytes: 0 %
Lymphocytes Relative: 13 %
Lymphs Abs: 1 K/uL (ref 0.7–4.0)
MCH: 30.9 pg (ref 26.0–34.0)
MCHC: 32.2 g/dL (ref 30.0–36.0)
MCV: 95.9 fL (ref 80.0–100.0)
Monocytes Absolute: 1 K/uL (ref 0.1–1.0)
Monocytes Relative: 13 %
Neutro Abs: 5.5 K/uL (ref 1.7–7.7)
Neutrophils Relative %: 74 %
Platelets: 147 K/uL — ABNORMAL LOW (ref 150–400)
RBC: 5.57 MIL/uL (ref 4.22–5.81)
RDW: 13.2 % (ref 11.5–15.5)
WBC: 7.5 K/uL (ref 4.0–10.5)
nRBC: 0 % (ref 0.0–0.2)

## 2024-09-09 LAB — RESP PANEL BY RT-PCR (RSV, FLU A&B, COVID)  RVPGX2
Influenza A by PCR: NEGATIVE
Influenza B by PCR: NEGATIVE
Resp Syncytial Virus by PCR: NEGATIVE
SARS Coronavirus 2 by RT PCR: NEGATIVE

## 2024-09-09 LAB — URINALYSIS, ROUTINE W REFLEX MICROSCOPIC
Bilirubin Urine: NEGATIVE
Glucose, UA: NEGATIVE mg/dL
Hgb urine dipstick: NEGATIVE
Ketones, ur: 20 mg/dL — AB
Leukocytes,Ua: NEGATIVE
Nitrite: NEGATIVE
Protein, ur: NEGATIVE mg/dL
Specific Gravity, Urine: 1.01 (ref 1.005–1.030)
pH: 6 (ref 5.0–8.0)

## 2024-09-09 LAB — LIPASE, BLOOD: Lipase: 37 U/L (ref 11–51)

## 2024-09-09 MED ORDER — SODIUM CHLORIDE 0.9 % IV BOLUS
500.0000 mL | Freq: Once | INTRAVENOUS | Status: AC
  Administered 2024-09-09: 500 mL via INTRAVENOUS

## 2024-09-09 MED ORDER — SODIUM CHLORIDE 0.9 % IV BOLUS
250.0000 mL | Freq: Once | INTRAVENOUS | Status: AC
  Administered 2024-09-09: 250 mL via INTRAVENOUS

## 2024-09-09 MED ORDER — PREDNISONE 10 MG PO TABS: 40.0000 mg | ORAL_TABLET | Freq: Every day | ORAL | 0 refills | Status: DC

## 2024-09-09 MED ORDER — LORAZEPAM 2 MG/ML IJ SOLN
1.0000 mg | Freq: Once | INTRAMUSCULAR | Status: AC
  Administered 2024-09-09: 1 mg via INTRAVENOUS
  Filled 2024-09-09: qty 1

## 2024-09-09 MED ORDER — IOHEXOL 300 MG/ML  SOLN
60.0000 mL | Freq: Once | INTRAMUSCULAR | Status: AC | PRN
Start: 2024-09-09 — End: 2024-09-09
  Administered 2024-09-09: 60 mL via INTRAVENOUS

## 2024-09-09 MED ORDER — ONDANSETRON HCL 4 MG/2ML IJ SOLN
4.0000 mg | Freq: Once | INTRAMUSCULAR | Status: AC
  Administered 2024-09-09: 4 mg via INTRAVENOUS
  Filled 2024-09-09: qty 2

## 2024-09-09 NOTE — ED Notes (Signed)
 Tolerated PO challenge well. Transferred to personal wheelchair

## 2024-09-09 NOTE — ED Triage Notes (Signed)
 C/o emesis since 0630 yesterday. Per mother pt had seizure 930pm last night, denied ems to bring to hospital. Home seizure medication given. Today continued vomiting, can't keep anything down.

## 2024-09-09 NOTE — ED Provider Notes (Signed)
  EMERGENCY DEPARTMENT AT [facility] Provider Note   CSN: [id number] Arrival date & time: 09/09/24  1708     Patient presents with: Nausea, Emesis, and Seizures   Theodore Lewis is a 28 y.o. male.  {Add pertinent medical, surgical, social history, OB history to HPI:32947}  Emesis Seizures  Continue to monitor, patient is coming in with emesis.  Multiple episodes of emesis.  Started yesterday.  No sick contacts at manage where.  Throwing up mostly clear liquid at his point time.  No hematemesis.  Last bowel movement was yesterday.  Patient still has a VP shunt but this is no longer working and they have not fix this in the past.  States that he was hitting his head and acting little agitated like he has had in the past when he had a UTI.  No previous abdominal surgeries.  No cough.  No fever.He has not been able to tolerate his seizure medication because of episodes emesis.  Seizure was yesterday.   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 This was treated with VP shunt. He has resultant spastic quadriparesis, contractures, poor vision, evidence of an axial collection of mixed type in the right frontal-temporal-parietal region and possible infarction of the right brain with encephalomalacia of the left brain, as well as intermittent episode of agitation      Prior to Admission medications   Medication Sig Start Date End Date Taking? Authorizing Provider  carbamazepine  (CARBATROL ) 300 MG 12 hr capsule Take 1 capsule (300 mg total) by mouth 2 (two) times daily. 08/16/24   Marianna City, NP  cetirizine  HCl (ZYRTEC ) 5 MG/5ML SOLN TAKE 10 ML(10 MG) BY MOUTH DAILY 08/10/24   Casy Debby CROME, MD  DEPAKOTE  SPRINKLES 125 MG capsule TAKE 5 CAPSULES  BY MOUTH TWICE DAILY 08/16/24   Marianna City, NP  diazepam  (DIASTAT  ACUDIAL) 10 MG GEL INSERT 10 MG RECTALLY AT ONSET OF SEIZURE 08/16/24   Marianna City, NP  ketoconazole  (NIZORAL ) 2 % cream Apply 1 Application topically 2 (two) times daily as needed for irritation. 09/12/23   Ledell Debby CROME, MD  LINZESS  72 MCG capsule TAKE 1 CAPSULE(72 MCG) BY MOUTH DAILY BEFORE BREAKFAST 12/22/23   Carden Debby CROME, MD  thiamine  (VITAMIN B-1) 50 MG tablet Take 1 tablet (50 mg total) by mouth daily. 02/15/24   Amarie Debby CROME, MD    Allergies: Penicillins and Vancomycin     Review of Systems  Gastrointestinal:  Positive for vomiting.  Neurological:  Positive for seizures.    Updated Vital Signs BP 97/67   Pulse 73   Temp (!) 97.5 F (36.4 C) (Axillary)   Resp 18   SpO2 99%   Physical Exam  (all labs ordered are listed, but only abnormal results are displayed) Labs Reviewed  CBC WITH DIFFERENTIAL/PLATELET - Abnormal; Notable for the following components:      Result Value   Hemoglobin 17.2 (*)    HCT 53.4 (*)    Platelets 147 (*)    All other components within normal limits  COMPREHENSIVE METABOLIC PANEL WITH GFR - Abnormal; Notable for the following components:   Calcium 10.4 (*)    Total Protein 8.3 (*)    All other components within normal limits  RESP PANEL BY RT-PCR (RSV, FLU A&B, COVID)  RVPGX2  LIPASE, BLOOD  URINALYSIS, ROUTINE W REFLEX MICROSCOPIC    EKG: EKG Interpretation Date/Time:  Thursday September 09 2024 19:53:57 EST Ventricular Rate:  72 PR Interval:  105 QRS Duration:  78 QT Interval:  348 QTC Calculation: 381 R Axis:   77  Text Interpretation: Sinus rhythm Short PR interval Confirmed by Simon Rea [phone]) on 09/09/2024 8:57:59 PM  Radiology: CT Head Wo Contrast Result Date: 09/09/2024 EXAM: CT HEAD WITHOUT CONTRAST 09/09/2024 07:50:00 PM TECHNIQUE: CT of the head was performed without the administration of intravenous contrast. Automated exposure  control, iterative reconstruction, and/or weight based adjustment of the mA/kV was utilized to reduce the radiation dose to as low as reasonably achievable. COMPARISON: CT Mar 12, 2021 CLINICAL HISTORY: Eval for ventriculomegaly. Shunt in place but not working. FINDINGS: BRAIN AND VENTRICLES: Chronic rim calcified oval mass in the right hemisphere is unchanged. The mixed internal density of the lesion is unchanged. Unchanged dysplastic corpus callosum, ventricles, left hemisphere and cerebellar dysgenesis or atrophy.No evidence of acute large vascular territory infarct r hydrocephalus. The intracranial portion of the CSF shunt appears stable. Similar discontinuity of the extracranial shunt tubing at level of mastoid. No acute intracranial hemorrhage identified. ORBITS: No acute abnormality. SINUSES: No acute abnormality. SOFT TISSUES AND SKULL: No skull fracture. IMPRESSION: 1. Chronic discontinuity of shunt tubing.  No hydrocephalus. 2. Stable appearance of the brain since 2022, detailed above. Electronically signed by: Gilmore Molt MD 09/09/2024 08:29 PM EST RP Workstation: [id number]   CT ABDOMEN PELVIS W CONTRAST Result Date: 09/09/2024 EXAM: CT ABDOMEN AND PELVIS WITH CONTRAST 09/09/2024 07:50:00 PM TECHNIQUE: CT of the abdomen and pelvis was performed with the administration of 60 mL of iohexol  (OMNIPAQUE ) 300 MG/ML solution. Multiplanar reformatted images are provided for review. Automated exposure control, iterative reconstruction, and/or weight-based adjustment of the mA/kV was utilized to reduce the radiation dose to as low as reasonably achievable. COMPARISON: Comparison with 12/15/2023. CLINICAL HISTORY: n/v. Unable to tolerate PO. Abd pain generalized. Non verbal so hard to get clear exam. FINDINGS: LOWER CHEST: No acute abnormality. LIVER: The liver is unremarkable. GALLBLADDER AND BILE DUCTS: Cholelithiasis. No evidence of acute cholecystitis. No biliary ductal dilatation. SPLEEN: No acute  abnormality. PANCREAS: No acute abnormality. ADRENAL GLANDS: No acute abnormality. KIDNEYS, URETERS AND BLADDER: No stones in the kidneys or ureters. No hydronephrosis. No perinephric or periureteral stranding. Urinary bladder is unremarkable. GI AND BOWEL: Stomach demonstrates no acute abnormality. There is no bowel obstruction. PERITONEUM AND RETROPERITONEUM: No ascites. No free air. VP shunt catheter tubing with tip in the left lower quadrant. VASCULATURE: Aorta is normal in caliber. LYMPH NODES: No lymphadenopathy. REPRODUCTIVE ORGANS: No acute abnormality. BONES AND SOFT TISSUES: Chronic right hip dislocation and dysplasia. No acute osseous abnormality. No focal soft tissue abnormality. IMPRESSION: 1. No acute abnormality in the abdomen or pelvis. 2. Cholelithiasis without cholecystitis. Electronically signed by: Norman Gatlin MD 09/09/2024 08:14 PM EST RP Workstation: [id number]    {Document cardiac monitor, telemetry assessment procedure when appropriate:32947} Procedures   Medications Ordered in the ED  sodium chloride  0.9 % bolus 500 mL (0 mLs Intravenous Stopped 09/09/24 1914)  ondansetron  (ZOFRAN ) injection 4 mg (4 mg Intravenous Given 09/09/24 1807)  LORazepam  (ATIVAN ) injection 1 mg (1 mg Intravenous Given 09/09/24 1833)  iohexol  (OMNIPAQUE ) 300 MG/ML solution 60 mL (60 mLs Intravenous Contrast Given 09/09/24 1929)  sodium chloride  0.9 % bolus 250 mL (0 mLs Intravenous Stopped 09/09/24 2252)      {Click here for ABCD2, HEART and other calculators  REFRESH Note before signing:1}                              Medical Decision Making Amount and/or Complexity of Data Reviewed Labs: ordered. Radiology: ordered.  Risk Prescription drug management.     HPI:   Continue to monitor, patient is coming in with emesis.  Multiple episodes of emesis.  Started yesterday.  No sick contacts at manage where.  Throwing up mostly clear liquid at his point time.  No hematemesis.  Last bowel movement  was yesterday.  Patient still has a VP shunt but this is no longer working and they have not fix this in the past.  States that he was hitting his head and acting little agitated like he has had in the past when he had a UTI.  No previous abdominal surgeries.  No cough.  No fever.  He has not been able to tolerate his seizure medication because of episodes emesis.  Seizure was yesterday.  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 This was treated with VP shunt. He has resultant spastic quadriparesis, contractures, poor vision, evidence of an axial collection of mixed type in the right frontal-temporal-parietal region and possible infarction of the right brain with encephalomalacia of the left brain, as well as intermittent episode of agitation   MDM:   Upon exam, patient acting around neurobaseline per mother.  Nonverbal.  Little sleepier than baseline.  No significant pain to palpation abdomen that could appreciate.  Obtain laboratory workup as well as abdominal labs including LFTs and lipase.  Will obtain UA to to make sure there is not a UTI.  CT scan of the abdomen to rule out ileus or obstruction.  Will also obtain CT head to make sure there is no ventriculomegaly.   Reevaluation:   Upon reexamination, patient hemodynamically stable.  Remains at neuro baseline   CT head showed no evidence of ventriculomegaly.   CT abdomen clear.  No obstruction or ileus.  No appendicitis.   Labs unremarkable.  Patient received after cc of fluid here in the ED.  Able to tolerate p.o.  No nausea or vomiting here in the ED.  Eating solids and liquids.    Interventions: 500 cc   EKG Interpreted by Me: nsr    Cardiac Tele Interpreted by Me: nsr    I have independently  interpreted the  CT  images and agree with the radiologist finding   Social Determinant of Health: Nonverbal / intellectual disability    Disposition and Follow Up: discharge    {Document critical care time when appropriate  Document review of labs and clinical decision tools ie CHADS2VASC2, etc  Document your independent review of radiology images and any outside records  Document your discussion with family members, caretakers and with consultants  Document social determinants of health affecting pt's care  Document your decision making why or why not admission, treatments were needed:32947:::1}   Final diagnoses:  Nausea  Nausea and vomiting, unspecified vomiting type  Seizure [facility])    ED Discharge Orders          Ordered    predniSONE (DELTASONE) 10 MG tablet  Daily,   Status:  Discontinued        09/09/24 2212

## 2024-09-09 NOTE — Discharge Instructions (Signed)
 No large electrolyte derangements. He was able to tolerate food and water here. Please continue with his home meds.   His imaging showed no acute changes.

## 2024-09-09 NOTE — ED Notes (Signed)
 Patient transported to CT

## 2024-09-09 NOTE — ED Notes (Signed)
 Placed condom cath on pt for UA collect

## 2024-09-10 MED ORDER — ONDANSETRON HCL 4 MG/5ML PO SOLN: 4.0000 mg | Freq: Three times a day (TID) | ORAL | 0 refills | Status: AC | PRN

## 2024-09-10 NOTE — ED Notes (Signed)
 The patients mother verbalized understanding of d/c instructions and follow up care. His mother did not want a set of discharge vitals obtained because it would agitate him and she is ready to take him home.

## 2024-09-10 NOTE — ED Provider Notes (Signed)
 Pt here for vomiting.  Care assumed pending UA.  UA is not c/w UTI.  He is able to tolerate p.o. in the emergency department without difficulty.  Will prescribe ondansetron  that he may take for recurrent nausea.  Discussed close return precautions for new or concerning symptoms.   Griselda [name], MD 09/10/24 MANUS

## 2024-09-13 ENCOUNTER — Other Ambulatory Visit (INDEPENDENT_AMBULATORY_CARE_PROVIDER_SITE_OTHER): Payer: Self-pay | Admitting: Family

## 2024-09-13 DIAGNOSIS — G40209 Localization-related (focal) (partial) symptomatic epilepsy and epileptic syndromes with complex partial seizures, not intractable, without status epilepticus: Secondary | ICD-10-CM

## 2024-09-13 DIAGNOSIS — G40309 Generalized idiopathic epilepsy and epileptic syndromes, not intractable, without status epilepticus: Secondary | ICD-10-CM

## 2024-10-08 ENCOUNTER — Telehealth (INDEPENDENT_AMBULATORY_CARE_PROVIDER_SITE_OTHER): Payer: Self-pay | Admitting: Family

## 2024-10-08 NOTE — Telephone Encounter (Signed)
 Contacted patients mother.  Verified patients name and DOB as well as mothers name.   I asked mom if she needed a list for all of his medication or just the rescue? Mom would like for all of his medications to be listed and sent to [facility].   Mom also wanted to let the provider know that Gayland will be going to [facility] on Monday to get his teeth cleaned, he has been prescribed 5 mg of diazepam  to calm him down before the appointment.   Med list has been printed and placed on the providers desk. Mom is aware of the provider being out of the office today.   SS, CCMA

## 2024-10-08 NOTE — Telephone Encounter (Signed)
  Name of who is calling: Candis Millard Relationship to Patient: Mom  Best contact number: [phone]  Provider they see: Marianna  Reason for call: Needs a Medication Order for the rescue medicine signed for one of the agencies that they use. She said they did not provide a form. She said Ellouise can type something up or print off Chandra's medication list and sign it.      PRESCRIPTION REFILL ONLY  Name of prescription:  Pharmacy:

## 2024-10-13 NOTE — Telephone Encounter (Signed)
 Noted

## 2024-11-10 NOTE — Telephone Encounter (Signed)
 Mom is having a home visit on Monday the 12th and is needing the paperwork that was requested back in December.   Please advise

## 2024-11-10 NOTE — Telephone Encounter (Signed)
 Contacted [facility] for their form with their letter head.   Representative stated that she would fax over the form today or tomorrow. She was also given my email.   SS, CCMA

## 2024-11-14 ENCOUNTER — Other Ambulatory Visit (INDEPENDENT_AMBULATORY_CARE_PROVIDER_SITE_OTHER): Payer: Self-pay | Admitting: Family

## 2024-11-14 DIAGNOSIS — G40209 Localization-related (focal) (partial) symptomatic epilepsy and epileptic syndromes with complex partial seizures, not intractable, without status epilepticus: Secondary | ICD-10-CM

## 2024-11-14 DIAGNOSIS — G40309 Generalized idiopathic epilepsy and epileptic syndromes, not intractable, without status epilepticus: Secondary | ICD-10-CM

## 2024-11-25 ENCOUNTER — Encounter: Admitting: Internal Medicine

## 2024-11-30 ENCOUNTER — Telehealth: Payer: Self-pay

## 2024-11-30 NOTE — Telephone Encounter (Signed)
 Copied from CRM #[id number]. Topic: General - Other >> Nov 30, 2024 11:12 AM Deleta RAMAN wrote: Reason for CRM: Cooper from [facility] is calling stating an office note is needed to provide the need of the supplies requested for the patient. Please contact [phone] ext [phone] fax [phone]

## 2024-11-30 NOTE — Telephone Encounter (Signed)
 Made a note in his appointment noted for his upcoming appointment to note in the OV that he needs his incontinence supplies so that we can fax them to [facility]. Will handle this on 12/28/2023

## 2024-12-27 ENCOUNTER — Encounter: Admitting: Internal Medicine

## 2025-02-14 ENCOUNTER — Ambulatory Visit (INDEPENDENT_AMBULATORY_CARE_PROVIDER_SITE_OTHER): Admitting: Family

## 2025-06-27 ENCOUNTER — Ambulatory Visit
